# Patient Record
Sex: Female | Born: 1970 | Race: White | Hispanic: No | Marital: Single | Smoking: Former smoker
Health system: Southern US, Community
[De-identification: ages and names within clinical notes are randomized; demographics above are authoritative.]

## PROBLEM LIST (undated history)

## (undated) DIAGNOSIS — S83206A Unspecified tear of unspecified meniscus, current injury, right knee, initial encounter: Secondary | ICD-10-CM

## (undated) DIAGNOSIS — Q615 Medullary cystic kidney: Secondary | ICD-10-CM

## (undated) DIAGNOSIS — R112 Nausea with vomiting, unspecified: Secondary | ICD-10-CM

## (undated) DIAGNOSIS — E559 Vitamin D deficiency, unspecified: Secondary | ICD-10-CM

## (undated) DIAGNOSIS — E119 Type 2 diabetes mellitus without complications: Secondary | ICD-10-CM

## (undated) DIAGNOSIS — Z9889 Other specified postprocedural states: Secondary | ICD-10-CM

## (undated) DIAGNOSIS — Z6841 Body Mass Index (BMI) 40.0 and over, adult: Secondary | ICD-10-CM

## (undated) DIAGNOSIS — M1990 Unspecified osteoarthritis, unspecified site: Secondary | ICD-10-CM

## (undated) DIAGNOSIS — F419 Anxiety disorder, unspecified: Secondary | ICD-10-CM

## (undated) DIAGNOSIS — M7752 Other enthesopathy of left foot: Secondary | ICD-10-CM

## (undated) DIAGNOSIS — R32 Unspecified urinary incontinence: Secondary | ICD-10-CM

## (undated) DIAGNOSIS — Z87442 Personal history of urinary calculi: Secondary | ICD-10-CM

## (undated) DIAGNOSIS — Z8489 Family history of other specified conditions: Secondary | ICD-10-CM

## (undated) DIAGNOSIS — G5602 Carpal tunnel syndrome, left upper limb: Secondary | ICD-10-CM

## (undated) HISTORY — PX: OTHER SURGICAL HISTORY: SHX169

## (undated) HISTORY — PX: LAPAROSCOPIC APPENDECTOMY: SUR753

## (undated) HISTORY — PX: TONSILLECTOMY: SUR1361

## (undated) HISTORY — PX: HERNIA REPAIR: SHX51

## (undated) HISTORY — PX: ABDOMINAL HYSTERECTOMY: SHX81

## (undated) HISTORY — DX: Vitamin D deficiency, unspecified: E55.9

## (undated) HISTORY — PX: LAPAROSCOPIC CHOLECYSTECTOMY: SUR755

## (undated) HISTORY — PX: APPENDECTOMY: SHX54

## (undated) HISTORY — PX: COLONOSCOPY: SHX174

## (undated) HISTORY — PX: CYSTOSCOPY MACROPLASTIQUE IMPLANT: SHX6636

---

## 2001-02-11 HISTORY — PX: VAGINAL HYSTERECTOMY: SUR661

## 2003-02-12 HISTORY — PX: URETEROLITHOTOMY: SHX71

## 2004-06-14 ENCOUNTER — Ambulatory Visit: Payer: Self-pay | Admitting: Urology

## 2004-06-22 ENCOUNTER — Ambulatory Visit: Payer: Self-pay | Admitting: Urology

## 2004-07-09 ENCOUNTER — Encounter: Admission: RE | Admit: 2004-07-09 | Discharge: 2004-07-09 | Payer: Self-pay | Admitting: Neurosurgery

## 2009-09-05 ENCOUNTER — Ambulatory Visit: Payer: Self-pay | Admitting: General Practice

## 2009-10-10 ENCOUNTER — Ambulatory Visit: Payer: Self-pay | Admitting: Surgery

## 2009-10-12 LAB — PATHOLOGY REPORT

## 2010-11-26 ENCOUNTER — Ambulatory Visit: Payer: Self-pay | Admitting: General Practice

## 2010-12-04 ENCOUNTER — Ambulatory Visit: Payer: Self-pay | Admitting: General Surgery

## 2012-09-29 ENCOUNTER — Ambulatory Visit: Payer: Self-pay | Admitting: Urology

## 2012-09-29 LAB — BASIC METABOLIC PANEL
Chloride: 105 mmol/L (ref 98–107)
Co2: 30 mmol/L (ref 21–32)
Creatinine: 0.68 mg/dL (ref 0.60–1.30)
EGFR (African American): >60
Osmolality: 276 (ref 275–301)
Potassium: 4.4 mmol/L (ref 3.5–5.1)
Sodium: 139 mmol/L (ref 136–145)

## 2012-10-05 ENCOUNTER — Ambulatory Visit: Payer: Self-pay | Admitting: Urology

## 2014-06-03 NOTE — H&P (Signed)
PATIENT NAME:  Leslie Morris MR#:  [id number] DATE OF BIRTH:  12/26/1970  DATE OF ADMISSION:  09/29/2012  DATE OF SURGERY: The patient is to have same-day surgery 10/05/2012.  CHIEF COMPLAINT: Urinary incontinence.   HISTORY OF PRESENT ILLNESS: [name] is a 44 year old white female with a one-year history of stress and urge incontinence. She also has a sensation of incomplete bladder emptying and voids up to 2 to 3 times per night. She wears 1 to 4 pads per day depending upon her activity level. She was initially treated with Ditropan XL 15 mg per day and had some improvement of her urgency but not her stress incontinence. She comes in now for Macroplastique injection.   ALLERGIES: No drug allergies.   CURRENT MEDICATIONS: Ditropan XL 15 mg a day and ibuprofen.   PAST SURGICAL HISTORY: Includes hysterectomy in 2002, ureteroscopic ureterolithotomy in 2004, cholecystectomy in 1157, umbilical herniorrhaphy and appendectomy in 2012, reconstruction of left ankle surgery in 1989 and 1990.   SOCIAL HISTORY: She denied tobacco or alcohol abuse.   FAMILY HISTORY: Remarkable for parents with kidney stones, hypercholesterolemia and hypertension.   CURRENT MEDICAL CONDITIONS:  1.  Medullary sponge kidneys.  2.  Kidney stones.  3.  Anxiety.  4.  Obesity.   REVIEW OF SYSTEMS: The patient denies chest pain, shortness of breath, diabetes, stroke.   PHYSICAL EXAMINATION:  GENERAL: An obese white female in no distress.  HEENT: Sclerae were clear. Pupils are equal, round and reactive to light and accommodation. Extraocular movements were intact.  NECK: Supple. No palpable cervical adenopathy. No audible carotid bruits.  LUNGS: Clear to auscultation.  CARDIOVASCULAR: Regular rhythm and rate without audible murmurs.  ABDOMEN: Soft, nontender abdomen.  GENITOURINARY: Normal external genitalia without vaginal discharge. She had first-degree cystocele with positive Marshall test. Normal urethral  meatus.  RECTAL: Deferred.  NEUROMUSCULAR: Alert and oriented x3.   IMPRESSION:  1.  Mixed urinary incontinence.  2.  Morbid obesity.   PLAN: Macroplastique injection.    ____________________________ Otelia Limes. Yves Dill, MD [email or web address] D: 09/29/2012 14:20:13 ET T: 09/29/2012 15:07:07 ET JOB#: [id number]  cc: Otelia Limes. Yves Dill, MD, <Dictator> Royston Cowper MD ELECTRONICALLY SIGNED 10/02/2012 9:12

## 2014-06-03 NOTE — H&P (Signed)
PATIENT NAME:  Leslie Morris MR#:  [id number] DATE OF BIRTH:  [birth year]  DATE OF ADMISSION:  09/29/2012  DATE OF SURGERY: The patient is to have same-day surgery 10/05/2012.  CHIEF COMPLAINT: Urinary incontinence.   HISTORY OF PRESENT ILLNESS: Ms. Brayton El is a 44 year old white female with a one-year history of stress and urge incontinence. She also has a sensation of incomplete bladder emptying and voids up to 2 to 3 times per night. She wears 1 to 4 pads per day depending upon her activity level. She was initially treated with Ditropan XL 15 mg per day and had some improvement of her urgency but not her stress incontinence. She comes in now for Macroplastique injection.   ALLERGIES: No drug allergies.   CURRENT MEDICATIONS: Ditropan XL 15 mg a day and ibuprofen.   PAST SURGICAL HISTORY: Includes hysterectomy in 2002, ureteroscopic ureterolithotomy in 2004, cholecystectomy in 4665, umbilical herniorrhaphy and appendectomy in 2012, reconstruction of left ankle surgery in 1989 and 1990.   SOCIAL HISTORY: She denied tobacco or alcohol abuse.   FAMILY HISTORY: Remarkable for parents with kidney stones, hypercholesterolemia and hypertension.   CURRENT MEDICAL CONDITIONS:  1.  Medullary sponge kidneys.  2.  Kidney stones.  3.  Anxiety.  4.  Obesity.   REVIEW OF SYSTEMS: The patient denies chest pain, shortness of breath, diabetes, stroke.   PHYSICAL EXAMINATION:  GENERAL: An obese white female in no distress.  HEENT: Sclerae were clear. Pupils are equal, round and reactive to light and accommodation. Extraocular movements were intact.  NECK: Supple. No palpable cervical adenopathy. No audible carotid bruits.  LUNGS: Clear to auscultation.  CARDIOVASCULAR: Regular rhythm and rate without audible murmurs.  ABDOMEN: Soft, nontender abdomen.  GENITOURINARY: Normal external genitalia without vaginal discharge. She had first-degree cystocele with positive Marshall test. Normal urethral  meatus.  RECTAL: Deferred.  NEUROMUSCULAR: Alert and oriented x3.   IMPRESSION:  1.  Mixed urinary incontinence.  2.  Morbid obesity.   PLAN: Macroplastique injection.    ____________________________ Otelia Limes. Yves Dill, MD [email or web address] D: 09/29/2012 14:20:13 ET T: 09/29/2012 15:07:07 ET JOB#: [id number]  cc: Otelia Limes. Yves Dill, MD, <Dictator> Royston Cowper MD ELECTRONICALLY SIGNED 10/02/2012 9:12

## 2014-06-03 NOTE — Op Note (Signed)
PATIENT NAME:  Leslie Morris Code MR#:  [id number] DATE OF BIRTH:  May 07, 1970  DATE OF PROCEDURE:  10/05/2012  PREOPERATIVE DIAGNOSIS: Urinary incontinence.   POSTOPERATIVE DIAGNOSIS: Urinary incontinence.   PROCEDURE: Cystoscopy with Macroplastique implant.   SURGEON: Maryan Puls, MD   ANESTHETIST: Rice and surgeon.  ANESTHETIC METHOD: General per Rice and local per Dr. Yves Dill.   INDICATIONS: See the dictated history and physical. After informed consent, the patient requests the above procedure.   OPERATIVE SUMMARY: After adequate general anesthesia had been obtained, the patient was placed into dorsal lithotomy position and the perineum was prepped and draped in the usual fashion. The 17-French cystoscope was coupled with the camera and then advanced into the bladder. The bladder was thoroughly inspected. Both ureteral orifices were identified and had clear efflux. No bladder mucosal lesions were identified. At this point, the Macroplastique needle was introduced through the scope and initial puncture was performed at the 3 o'clock position. 2.5 mL of Macroplastique was injected in this location and there was good elevation of the mucosa. Second injection was performed at the 6 o'clock position and the third injection was performed at the 9 o'clock position. After completion of all 3 injections there was complete coaptation of the mucosa circumferentially. A total of 7.5 mL of Macroplastique was injected. At this point, the scope was removed and bladder was drained with a 10-French red Robinson catheter. 10 mL of viscous Xylocaine was injected within the urethra and the bladder. A B and O suppository was placed. The procedure was then terminated, and the patient was transferred to the recovery room in stable condition. ____________________________ Otelia Limes. Yves Dill, MD [email or web address] D: 10/05/2012 10:22:27 ET T: 10/05/2012 10:43:12 ET JOB#: [id number]  cc: Otelia Limes. Yves Dill, MD, <Dictator> Royston Cowper MD ELECTRONICALLY SIGNED 10/05/2012 11:57

## 2014-09-19 ENCOUNTER — Ambulatory Visit
Admission: RE | Admit: 2014-09-19 | Discharge: 2014-09-19 | Disposition: A | Discharge status: Home / Self Care | Payer: PRIVATE HEALTH INSURANCE | Source: Ambulatory Visit | Attending: Physician Assistant | Admitting: Physician Assistant

## 2014-09-19 ENCOUNTER — Other Ambulatory Visit: Payer: Self-pay | Admitting: Physician Assistant

## 2014-09-19 DIAGNOSIS — M25572 Pain in left ankle and joints of left foot: Secondary | ICD-10-CM | POA: Diagnosis present

## 2014-09-19 DIAGNOSIS — M25571 Pain in right ankle and joints of right foot: Secondary | ICD-10-CM

## 2015-04-14 ENCOUNTER — Other Ambulatory Visit: Payer: Self-pay | Admitting: Physician Assistant

## 2015-04-14 ENCOUNTER — Ambulatory Visit: Payer: Self-pay | Admitting: Physician Assistant

## 2015-04-14 ENCOUNTER — Encounter: Payer: Self-pay | Admitting: Physician Assistant

## 2015-04-14 VITALS — BP 119/80 | HR 82 | Temp 98.0°F

## 2015-04-14 DIAGNOSIS — H65191 Other acute nonsuppurative otitis media, right ear: Secondary | ICD-10-CM

## 2015-04-14 LAB — POCT INFLUENZA A/B
INFLUENZA A, POC: NEGATIVE
INFLUENZA B, POC: NEGATIVE

## 2015-04-14 MED ORDER — FEXOFENADINE-PSEUDOEPHED ER 60-120 MG PO TB12: 1.0000 | ORAL_TABLET | Freq: Two times a day (BID) | ORAL | Status: DC

## 2015-04-14 MED ORDER — AMOXICILLIN 875 MG PO TABS: 875.0000 mg | ORAL_TABLET | Freq: Two times a day (BID) | ORAL | Status: DC

## 2015-04-14 MED ORDER — ONDANSETRON HCL 8 MG PO TABS: 8.0000 mg | ORAL_TABLET | Freq: Three times a day (TID) | ORAL | Status: DC | PRN

## 2015-04-14 NOTE — Progress Notes (Signed)
   Subjective:    Patient ID: Leslie Morris, female    DOB: February 29, 1970, 45 y.o.   MRN: [id number]  HPI Patient c/o right ear pain, nasal congestion, nausea, diarrhea, and malaise. Patient has taken Flu shot for this season. States daughter and co-worker have flu. Denies fever/chill or sore throat.   Review of Systems    Obesity Objective:   Physical Exam HEENT for edematous and erythematous right TM.  Bilateral maxillary guarding.  Bilateral nasal turbinates.  Neck supple, no adenopathy. Lungs CTS, and Heart RRR.       Assessment & Plan: Right Otitis Medica  Amoxil, Allergra-D, and Zofran.  Follow up with PCP. Work note for today.

## 2015-04-14 NOTE — Addendum Note (Signed)
Addended by: Rudene Anda T on: 04/14/2015 01:55 PM   Modules accepted: Orders

## 2015-04-18 ENCOUNTER — Ambulatory Visit: Payer: Self-pay | Admitting: Registered Nurse

## 2015-04-18 VITALS — BP 110/79 | HR 90 | Temp 98.4°F

## 2015-04-18 DIAGNOSIS — H6593 Unspecified nonsuppurative otitis media, bilateral: Secondary | ICD-10-CM

## 2015-04-18 DIAGNOSIS — B349 Viral infection, unspecified: Secondary | ICD-10-CM

## 2015-04-18 MED ORDER — SALINE SPRAY 0.65 % NA SOLN: 2.0000 | NASAL | Status: DC | PRN

## 2015-04-18 MED ORDER — FLUTICASONE PROPIONATE 50 MCG/ACT NA SUSP: 1.0000 | Freq: Two times a day (BID) | NASAL | Status: DC

## 2015-04-18 MED ORDER — ALBUTEROL SULFATE HFA 108 (90 BASE) MCG/ACT IN AERS: 1.0000 | INHALATION_SPRAY | RESPIRATORY_TRACT | Status: DC | PRN

## 2015-04-18 NOTE — Progress Notes (Signed)
Subjective:    Patient ID: Leslie Morris, female    DOB: October 01, 1970, 45 y.o.   MRN: [id number]  HPI Comments: Caucasian female was seen last week by PA Ron Tamala Julian 04/14/2015 for sinusitis/otitis media and nausea.  Patient reported she has been taking zofran, amoxicillin and allegra D as prescribed.  Stomach upset resolved still having post tussive emesis though, intermittent wheeze.  PMHx seasonal allergic rhinitis typically fall ragweed cannot remember having to use oral steroids in the past has had to take advair diskus in the past  Requires work excuse didn't go to work yesterday or today due to vomiting with coughing.  Right ear still hurting a lot using heating pad on high to give her some relief.  Tested negative for rapid flu 04/14/2015.  Still running low grade fever at home tylenol or motrin po prn OTC.  URI  Associated symptoms include congestion, coughing, ear pain, headaches, nausea, vomiting and wheezing. Pertinent negatives include no abdominal pain, chest pain, diarrhea, dysuria, neck pain, rash, rhinorrhea, sneezing or sore throat.      Review of Systems  Constitutional: Positive for fever and fatigue. Negative for chills, diaphoresis, activity change, appetite change and unexpected weight change.  HENT: Positive for congestion, ear pain, postnasal drip and sinus pressure. Negative for dental problem, drooling, ear discharge, facial swelling, hearing loss, mouth sores, nosebleeds, rhinorrhea, sneezing, sore throat, tinnitus, trouble swallowing and voice change.   Eyes: Negative for photophobia, pain, discharge, redness, itching and visual disturbance.  Respiratory: Positive for cough and wheezing. Negative for choking, chest tightness, shortness of breath and stridor.   Cardiovascular: Negative for chest pain, palpitations and leg swelling.  Gastrointestinal: Positive for nausea and vomiting. Negative for abdominal pain, diarrhea, constipation, blood in stool and abdominal  distention.  Endocrine: Negative for cold intolerance and heat intolerance.  Genitourinary: Negative for dysuria, hematuria and difficulty urinating.  Musculoskeletal: Positive for myalgias. Negative for back pain, joint swelling, arthralgias, gait problem, neck pain and neck stiffness.  Skin: Negative for color change, pallor, rash and wound.  Allergic/Immunologic: Positive for environmental allergies. Negative for food allergies.  Neurological: Positive for headaches. Negative for dizziness, tremors, seizures, syncope, facial asymmetry, speech difficulty, weakness, light-headedness and numbness.  Hematological: Negative for adenopathy. Does not bruise/bleed easily.  Psychiatric/Behavioral: Negative for behavioral problems, confusion, sleep disturbance and agitation.       Objective:   Physical Exam  Constitutional: She is oriented to person, place, and time. She appears well-developed and well-nourished. She is active and cooperative.  Non-toxic appearance. She does not have a sickly appearance. She appears ill. No distress.  HENT:  Head: Normocephalic and atraumatic.  Right Ear: Hearing, external ear and ear canal normal. A middle ear effusion is present.  Left Ear: Hearing, external ear and ear canal normal. A middle ear effusion is present.  Nose: Mucosal edema and rhinorrhea present. No nose lacerations, sinus tenderness, nasal deformity, septal deviation or nasal septal hematoma. No epistaxis.  No foreign bodies. Right sinus exhibits no maxillary sinus tenderness and no frontal sinus tenderness. Left sinus exhibits no maxillary sinus tenderness and no frontal sinus tenderness.  Mouth/Throat: Uvula is midline and mucous membranes are normal. Mucous membranes are not pale, not dry and not cyanotic. She does not have dentures. No oral lesions. No trismus in the jaw. Normal dentition. No dental abscesses, uvula swelling, lacerations or dental caries. Posterior oropharyngeal edema and posterior  oropharyngeal erythema present. No oropharyngeal exudate or tonsillar abscesses.  Cobblestoning posterior pharynx; bilateral  TMs with air fluid level slight opacity; bilateral nasal turbinates edema/erythema/yellow  Eyes: Conjunctivae, EOM and lids are normal. Pupils are equal, round, and reactive to light. Right eye exhibits no chemosis, no discharge, no exudate and no hordeolum. No foreign body present in the right eye. Left eye exhibits no chemosis, no discharge, no exudate and no hordeolum. No foreign body present in the left eye. Right conjunctiva is not injected. Right conjunctiva has no hemorrhage. Left conjunctiva is not injected. Left conjunctiva has no hemorrhage. No scleral icterus. Right eye exhibits normal extraocular motion and no nystagmus. Left eye exhibits normal extraocular motion and no nystagmus. Right pupil is round and reactive. Left pupil is round and reactive. Pupils are equal.  Neck: Trachea normal and normal range of motion. Neck supple. No tracheal tenderness, no spinous process tenderness and no muscular tenderness present. No rigidity. No tracheal deviation, no edema, no erythema and normal range of motion present. No thyroid mass and no thyromegaly present.  Cardiovascular: Normal rate, regular rhythm, S1 normal, S2 normal, normal heart sounds and intact distal pulses.  PMI is not displaced.  Exam reveals no gallop and no friction rub.   No murmur heard. Pulmonary/Chest: Effort normal and breath sounds normal. No accessory muscle usage or stridor. No respiratory distress. She has no decreased breath sounds. She has no wheezes. She has no rhonchi. She has no rales. She exhibits no tenderness.  Abdominal: Soft. She exhibits no distension.  Musculoskeletal: Normal range of motion. She exhibits no edema or tenderness.       Right shoulder: Normal.       Left shoulder: Normal.       Right hip: Normal.       Left hip: Normal.       Right knee: Normal.       Left knee: Normal.        Cervical back: Normal.       Right hand: Normal.       Left hand: Normal.  Lymphadenopathy:       Head (right side): No submental, no submandibular, no tonsillar, no preauricular, no posterior auricular and no occipital adenopathy present.       Head (left side): No submental, no submandibular, no tonsillar, no preauricular, no posterior auricular and no occipital adenopathy present.    She has no cervical adenopathy.       Right cervical: No superficial cervical, no deep cervical and no posterior cervical adenopathy present.      Left cervical: No superficial cervical, no deep cervical and no posterior cervical adenopathy present.  Neurological: She is alert and oriented to person, place, and time. She has normal strength. She is not disoriented. She displays no atrophy and no tremor. No cranial nerve deficit or sensory deficit. She exhibits normal muscle tone. She displays no seizure activity. Coordination and gait normal. GCS eye subscore is 4. GCS verbal subscore is 5. GCS motor subscore is 6.  Skin: Skin is warm, dry and intact. No abrasion, no bruising, no burn, no ecchymosis, no laceration, no lesion, no petechiae and no rash noted. She is not diaphoretic. No cyanosis or erythema. No pallor. Nails show no clubbing.  Psychiatric: She has a normal mood and affect. Her speech is normal and behavior is normal. Judgment and thought content normal. Cognition and memory are normal.  Nursing note and vitals reviewed.         Assessment & Plan:  A-Viral illness influenza like; viral gastroenteritis, otitis media effusion P-continue  amoxicillin, allegra D, start flonase 1 spray each nostril BID, nasal saline 2 sprays each nostril q2h prn congestion, albuterol 1-2 puffs po q4-6h prn cough/wheeze, honey with lemon, cough drops, delsym or nyquil po prn cough bedtime; work excuse 6-19 Apr 2015 stay at home until fever free 24 hours off tylenol/motrin and no diarrhea/vomiting x 24 hours  Viral  illness: no evidence of invasive bacterial infection, non toxic and well hydrated.  This is most likely self limiting viral infection.  I do not see where any further testing or imaging is necessary at this time.   I will suggest supportive care, rest, good hygiene and encourage the patient to take adequate fluids.  Tylenol 1000mg  po QID prn pain/fever.  Discussed honey with lemon and salt water gargles for comfort also.  The patient is to return to clinic or EMERGENCY ROOM if symptoms worsen or change significantly e.g. fever, lethargy, SOB, wheezing.  Patient verbalized agreement and understanding of treatment plan.    Has zofran po at home prn use from 04/14/2015.  Discussed use inhaler albuterol MDI 2 puffs po q6h x 48 hours and then prn protracted cough wheezing every 4-6hours after that.  I have recommended clear fluids and bland diet.  Avoid dairy/spicy, fried and large portions of meat while having nausea.  If vomiting hold po intake x 1 hour.  Then sips clear fluids like broths, ginger ale, power ade, gatorade, pedialyte may advance to soft/bland if no vomiting x 24 hours and appetite returned otherwise hydration main focus.     Return to the clinic if symptoms persist or worsen; I have alerted the patient to call if high fever, dehydration, marked weakness, fainting, increased abdominal pain, blood in stool or vomit (red or black).  Patient verbalized agreement and understanding of treatment plan and had no further questions at this time.  Supportive treatment.   No evidence of invasive bacterial infection, non toxic and well hydrated.  This is most likely self limiting viral infection.  I do not see where any further testing or imaging is necessary at this time.   I will suggest supportive care, rest, good hygiene and encourage the patient to take adequate fluids.  The patient is to return to clinic or EMERGENCY ROOM if symptoms worsen or change significantly e.g. ear pain, fever, purulent discharge  from ears or bleeding.  Patient verbalized agreement and understanding of treatment plan.

## 2015-06-29 ENCOUNTER — Encounter: Payer: Self-pay | Admitting: Physician Assistant

## 2015-06-29 ENCOUNTER — Ambulatory Visit: Payer: Self-pay | Admitting: Physician Assistant

## 2015-06-29 VITALS — BP 129/80 | HR 74 | Temp 98.5°F

## 2015-06-29 DIAGNOSIS — E559 Vitamin D deficiency, unspecified: Secondary | ICD-10-CM

## 2015-06-29 DIAGNOSIS — N393 Stress incontinence (female) (male): Secondary | ICD-10-CM

## 2015-06-29 DIAGNOSIS — M25561 Pain in right knee: Secondary | ICD-10-CM

## 2015-06-29 MED ORDER — VITAMIN D (ERGOCALCIFEROL) 1.25 MG (50000 UNIT) PO CAPS: 50000.0000 [IU] | ORAL_CAPSULE | ORAL | Status: DC

## 2015-06-29 MED ORDER — NAPROXEN 500 MG PO TABS: 500.0000 mg | ORAL_TABLET | Freq: Two times a day (BID) | ORAL | Status: DC

## 2015-06-29 MED ORDER — TRAMADOL HCL 50 MG PO TABS: 50.0000 mg | ORAL_TABLET | Freq: Four times a day (QID) | ORAL | Status: DC | PRN

## 2015-06-29 MED ORDER — OXYBUTYNIN CHLORIDE ER 15 MG PO TB24: 15.0000 mg | ORAL_TABLET | Freq: Every day | ORAL | Status: DC

## 2015-06-29 NOTE — Progress Notes (Signed)
S: c/o r knee pain, was standing in the kitchen and her over 100lb dog ran directly into the front of her knee, pain was so bad she was crying, doesn't feel unstable, just cant stand on it or go from sitting to standing without a lot of pain, hurts to extend the leg, doesn't feel she can extend all of the way  O: vitals wnl, nad, skin intact, r knee tender at anterior of patella and joint line down to turbercle tuberosity, can extend the knee but it reproduces pain, n/v intact, applied 3 ace wraps, pt was able to stand and felt better with the compression, gave crutches and instructions  A: acute knee pain  P: naproxyn, tramadol 50mg  #20 with 1 refill as patient can use this at work, f/u with dr h. Miller; also refill of ditropan and vit d

## 2015-06-30 ENCOUNTER — Encounter: Payer: Self-pay | Admitting: Family

## 2015-06-30 ENCOUNTER — Ambulatory Visit (INDEPENDENT_AMBULATORY_CARE_PROVIDER_SITE_OTHER): Payer: Managed Care, Other (non HMO) | Admitting: Family

## 2015-06-30 VITALS — BP 132/80 | HR 65 | Temp 98.5°F | Ht 67.5 in | Wt 363.0 lb

## 2015-06-30 DIAGNOSIS — M25561 Pain in right knee: Secondary | ICD-10-CM | POA: Insufficient documentation

## 2015-06-30 MED ORDER — DICLOFENAC SODIUM 2 % TD SOLN: 1.0000 "application " | Freq: Two times a day (BID) | TRANSDERMAL | Status: DC | PRN

## 2015-06-30 NOTE — Assessment & Plan Note (Signed)
Right knee pain consistent with possible contusion related to impact with her dog. Ultrasound shows mild pre-patellar bursitis which is most likely chronic and also a small area of effusion consistent with hematoma or possible bursa. Treat conservatively with ice, Pennsaid, and home exercise therapy. Follow-up pending conservative treatment if symptoms worsen or do not improve.

## 2015-06-30 NOTE — Patient Instructions (Signed)
Thank you for choosing [facility].  Summary/Instructions:  Ice 2-3 times per day and as needed after activity Exercise daily. Pennsaid - 2x daily to the affected area about 1/2 pack   Your prescription(s) have been submitted to your pharmacy or been printed and provided for you. Please take as directed and contact our office if you believe you are having problem(s) with the medication(s) or have any questions.  Generic Knee Exercises EXERCISES RANGE OF MOTION (ROM) AND STRETCHING EXERCISES These exercises may help you when beginning to rehabilitate your injury. Your symptoms may resolve with or without further involvement from your physician, physical therapist, or athletic trainer. While completing these exercises, remember:   Restoring tissue flexibility helps normal motion to return to the joints. This allows healthier, less painful movement and activity.  An effective stretch should be held for at least 30 seconds.  A stretch should never be painful. You should only feel a gentle lengthening or release in the stretched tissue. STRETCH - Knee Extension, Prone  Lie on your stomach on a firm surface, such as a bed or countertop. Place your right / left knee and leg just beyond the edge of the surface. You may wish to place a towel under the far end of your right / left thigh for comfort.  Relax your leg muscles and allow gravity to straighten your knee. Your clinician may advise you to add an ankle weight if more resistance is helpful for you.  You should feel a stretch in the back of your right / left knee. Hold this position for __________ seconds. Repeat __________ times. Complete this stretch __________ times per day. * Your physician, physical therapist, or athletic trainer may ask you to add ankle weight to enhance your stretch.  RANGE OF MOTION - Knee Flexion, Active  Lie on your back with both knees straight. (If this causes back discomfort, bend your opposite knee,  placing your foot flat on the floor.)  Slowly slide your heel back toward your buttocks until you feel a gentle stretch in the front of your knee or thigh.  Hold for __________ seconds. Slowly slide your heel back to the starting position. Repeat __________ times. Complete this exercise __________ times per day.  STRETCH - Quadriceps, Prone   Lie on your stomach on a firm surface, such as a bed or padded floor.  Bend your right / left knee and grasp your ankle. If you are unable to reach your ankle or pant leg, use a belt around your foot to lengthen your reach.  Gently pull your heel toward your buttocks. Your knee should not slide out to the side. You should feel a stretch in the front of your thigh and/or knee.  Hold this position for __________ seconds. Repeat __________ times. Complete this stretch __________ times per day.  STRETCH - Hamstrings, Supine   Lie on your back. Loop a belt or towel over the ball of your right / left foot.  Straighten your right / left knee and slowly pull on the belt to raise your leg. Do not allow the right / left knee to bend. Keep your opposite leg flat on the floor.  Raise the leg until you feel a gentle stretch behind your right / left knee or thigh. Hold this position for __________ seconds. Repeat __________ times. Complete this stretch __________ times per day.  STRENGTHENING EXERCISES These exercises may help you when beginning to rehabilitate your injury. They may resolve your symptoms with or without  further involvement from your physician, physical therapist, or athletic trainer. While completing these exercises, remember:   Muscles can gain both the endurance and the strength needed for everyday activities through controlled exercises.  Complete these exercises as instructed by your physician, physical therapist, or athletic trainer. Progress the resistance and repetitions only as guided.  You may experience muscle soreness or fatigue, but  the pain or discomfort you are trying to eliminate should never worsen during these exercises. If this pain does worsen, stop and make certain you are following the directions exactly. If the pain is still present after adjustments, discontinue the exercise until you can discuss the trouble with your clinician. STRENGTH - Quadriceps, Isometrics  Lie on your back with your right / left leg extended and your opposite knee bent.  Gradually tense the muscles in the front of your right / left thigh. You should see either your knee cap slide up toward your hip or increased dimpling just above the knee. This motion will push the back of the knee down toward the floor/mat/bed on which you are lying.  Hold the muscle as tight as you can without increasing your pain for __________ seconds.  Relax the muscles slowly and completely in between each repetition. Repeat __________ times. Complete this exercise __________ times per day.  STRENGTH - Quadriceps, Short Arcs   Lie on your back. Place a __________ inch towel roll under your knee so that the knee slightly bends.  Raise only your lower leg by tightening the muscles in the front of your thigh. Do not allow your thigh to rise.  Hold this position for __________ seconds. Repeat __________ times. Complete this exercise __________ times per day.  OPTIONAL ANKLE WEIGHTS: Begin with ____________________, but DO NOT exceed ____________________. Increase in 1 pound/0.5 kilogram increments.  STRENGTH - Quadriceps, Straight Leg Raises  Quality counts! Watch for signs that the quadriceps muscle is working to insure you are strengthening the correct muscles and not "cheating" by substituting with healthier muscles.  Lay on your back with your right / left leg extended and your opposite knee bent.  Tense the muscles in the front of your right / left thigh. You should see either your knee cap slide up or increased dimpling just above the knee. Your thigh may even  quiver.  Tighten these muscles even more and raise your leg 4 to 6 inches off the floor. Hold for __________ seconds.  Keeping these muscles tense, lower your leg.  Relax the muscles slowly and completely in between each repetition. Repeat __________ times. Complete this exercise __________ times per day.  STRENGTH - Hamstring, Curls  Lay on your stomach with your legs extended. (If you lay on a bed, your feet may hang over the edge.)  Tighten the muscles in the back of your thigh to bend your right / left knee up to 90 degrees. Keep your hips flat on the bed/floor.  Hold this position for __________ seconds.  Slowly lower your leg back to the starting position. Repeat __________ times. Complete this exercise __________ times per day.  OPTIONAL ANKLE WEIGHTS: Begin with ____________________, but DO NOT exceed ____________________. Increase in 1 pound/0.5 kilogram increments.  STRENGTH - Quadriceps, Squats  Stand in a door frame so that your feet and knees are in line with the frame.  Use your hands for balance, not support, on the frame.  Slowly lower your weight, bending at the hips and knees. Keep your lower legs upright so that they are  parallel with the door frame. Squat only within the range that does not increase your knee pain. Never let your hips drop below your knees.  Slowly return upright, pushing with your legs, not pulling with your hands. Repeat __________ times. Complete this exercise __________ times per day.  STRENGTH - Quadriceps, Wall Slides  Follow guidelines for form closely. Increased knee pain often results from poorly placed feet or knees.  Lean against a smooth wall or door and walk your feet out 18-24 inches. Place your feet hip-width apart.  Slowly slide down the wall or door until your knees bend __________ degrees.* Keep your knees over your heels, not your toes, and in line with your hips, not falling to either side.  Hold for __________ seconds.  Stand up to rest for __________ seconds in between each repetition. Repeat __________ times. Complete this exercise __________ times per day. * Your physician, physical therapist, or athletic trainer will alter this angle based on your symptoms and progress.   This information is not intended to replace advice given to you by your health care provider. Make sure you discuss any questions you have with your health care provider.   Document Released: 12/12/2004 Document Revised: 02/18/2014 Document Reviewed: 05/12/2008 Elsevier Interactive Patient Education [employer]. If your symptoms worsen or fail to improve, please contact our office for further instruction, or in case of emergency go directly to the emergency room at the closest medical facility.    t

## 2015-06-30 NOTE — Progress Notes (Signed)
Pre visit review using our clinic review tool, if applicable. No additional management support is needed unless otherwise documented below in the visit note. 

## 2015-06-30 NOTE — Progress Notes (Signed)
Subjective:    Patient ID: Leslie Morris, female    DOB: 1970-07-29, 45 y.o.   MRN: [id number]  Chief Complaint  Patient presents with  . Knee Pain    HPI:  Leslie Morris is a 45 y.o. female who  has a past medical history of Vitamin D deficiency; Chicken pox; and Kidney stones. and presents today for an office visit.  This is a new problem. Associated symptom of pain located in her right knee has been going on for about 1-2 days following standing in the kitchen when her over 100 lb dog ran directly into the front of her knee which was planted in the ground. Denies any sounds or sensations heard/felt. Described extensive pain with any weight bearing activities. Felt like a metal bat hit her leg. She was seen at her [facility] and prescribed naproxen. Other modifying factors include ice. Has noted a little improvement since yesterday. Denies any popping/snaping/catching or locking. There is mild reduction in full flexion  No Known Allergies   Outpatient Prescriptions Prior to Visit  Medication Sig Dispense Refill  . naproxen (NAPROSYN) 500 MG tablet Take 1 tablet (500 mg total) by mouth 2 (two) times daily with a meal. 30 tablet 0  . oxybutynin (DITROPAN XL) 15 MG 24 hr tablet Take 1 tablet (15 mg total) by mouth at bedtime. 30 tablet 6  . traMADol (ULTRAM) 50 MG tablet Take 1 tablet (50 mg total) by mouth every 6 (six) hours as needed. 20 tablet 1  . Vitamin D, Ergocalciferol, (DRISDOL) 50000 units CAPS capsule Take 1 capsule (50,000 Units total) by mouth every 7 (seven) days. 4 capsule 12  . fluticasone (FLONASE) 50 MCG/ACT nasal spray Place 1 spray into both nostrils 2 (two) times daily. 16 g 0  . sodium chloride (OCEAN) 0.65 % SOLN nasal spray Place 2 sprays into both nostrils every 2 (two) hours as needed for congestion.  0   No facility-administered medications prior to visit.     Past Medical History  Diagnosis Date  . Vitamin D deficiency   . Chicken pox     . Kidney stones      Past Surgical History  Procedure Laterality Date  . Ankle surgery      Left from trauma  . Cholecystectomy    . Abdominal hysterectomy    . Tonsillectomy    . Appendectomy       History reviewed. No pertinent family history.   Social History   Social History  . Marital Status: Single    Spouse Name: N/A  . Number of Children: 2  . Years of Education: 14   Occupational History  . [employer]    Social History Main Topics  . Smoking status: Former Research scientist (life sciences)  . Smokeless tobacco: Never Used  . Alcohol Use: No  . Drug Use: No  . Sexual Activity: Not on file   Other Topics Concern  . Not on file   Social History Narrative  Review of Systems  Constitutional: Negative for fever and chills.  Cardiovascular: Negative for chest pain, palpitations and leg swelling.  Musculoskeletal:       Positive for right knee pain  Neurological: Negative for weakness and numbness.      Objective:    BP 132/80 mmHg  Pulse 65  Temp(Src) 98.5 F (36.9 C) (Oral)  Ht 5' 7.5" (1.715 m)  Wt 363 lb (164.656 kg)  BMI 55.98 kg/m2  SpO2 97% Nursing note and vital signs reviewed.  Physical Exam  Constitutional: She is oriented to person, place, and time. She appears well-developed and well-nourished. No distress.  Cardiovascular: Normal rate, regular rhythm, normal heart sounds and intact distal pulses.   Pulmonary/Chest: Effort normal and breath sounds normal.  Musculoskeletal:  Right knee - no obvious deformity or discoloration with mild edema of the lateral knee noted. No tenderness able to be elicited. Range of motion is within normal limits. Strength is 4+. Ligamentous testing is negative. Positive McMurray's. Distal pulses and sensation are intact and appropriate.  Neurological: She is alert and oriented to  person, place, and time.  Skin: Skin is warm and dry.  Psychiatric: She has a normal mood and affect. Her behavior is normal. Judgment and thought content normal.   Examination: Ultrasound of Right knee Date:  06/30/2015 Patient Name: Leslie Morris History: Knee pain following collision with her 100 lb dog Findings:  The extensor mechanism, including the quadriceps tendon, patella, and patellar tendon are normal with small area of fluid consistent with pre-patellar bursitis. There is mild effusion over the lateral joint line consistent with hematoma or possible bursa. The medial collateral and lateral collateral ligaments are normal. Unremarkable iliotibial tract, biceps femoris, popliteus tendon, and common peroneal nerve. No Baker cyst. Limited evaluation of the menisci is unremarkable.  Impression:  Mild pre-patellar bursitis and possible hematoma/bursitis of lateral joint line.         All images are located under the media tab. [place] ordered, performed and interpreted by Terri Piedra, FNP       Assessment & Plan:   Problem List Items Addressed This Visit      Other   Right knee pain - Primary    Right knee pain consistent with possible contusion related to impact with her dog. Ultrasound shows mild pre-patellar bursitis which is most likely chronic and also a small area of effusion consistent with hematoma or possible bursa. Treat conservatively with ice, Pennsaid, and home exercise therapy. Follow-up pending conservative treatment if symptoms worsen or do not improve.      Relevant Medications   Diclofenac Sodium (PENNSAID) 2 % SOLN   Other Relevant Orders   [place] Extrem Low Right Ltd       I have discontinued Ms. Stovall's fluticasone and sodium chloride. I am also having her start on Diclofenac Sodium. Additionally, I am having her maintain her naproxen, traMADol, oxybutynin, and Vitamin D (Ergocalciferol).   Meds ordered this encounter  Medications  . Diclofenac Sodium  (PENNSAID) 2 % SOLN    Sig: Place 1 application onto the skin 2 (two) times daily as needed.    Dispense:  112 g    Refill:  1    Order Specific Question:  Supervising Provider    Answer:  Pricilla Holm A [id number]     Follow-up: Return in about 3 weeks (around 07/21/2015), or if symptoms worsen or fail to improve.  Mauricio Po, FNP

## 2015-07-17 ENCOUNTER — Ambulatory Visit: Payer: Self-pay | Admitting: Physician Assistant

## 2015-07-17 ENCOUNTER — Encounter: Payer: Self-pay | Admitting: Physician Assistant

## 2015-07-17 VITALS — BP 130/90 | HR 60 | Temp 97.9°F

## 2015-07-17 DIAGNOSIS — J069 Acute upper respiratory infection, unspecified: Secondary | ICD-10-CM

## 2015-07-17 DIAGNOSIS — M25569 Pain in unspecified knee: Secondary | ICD-10-CM

## 2015-07-17 MED ORDER — CEFDINIR 300 MG PO CAPS: 300.0000 mg | ORAL_CAPSULE | Freq: Two times a day (BID) | ORAL | Status: DC

## 2015-07-17 MED ORDER — FLUCONAZOLE 150 MG PO TABS: ORAL_TABLET | ORAL | Status: DC

## 2015-07-17 MED ORDER — HYDROCODONE-ACETAMINOPHEN 5-325 MG PO TABS: 1.0000 | ORAL_TABLET | Freq: Four times a day (QID) | ORAL | Status: DC | PRN

## 2015-07-17 MED ORDER — METHYLPREDNISOLONE 4 MG PO TBPK: ORAL_TABLET | ORAL | Status: DC

## 2015-07-17 NOTE — Progress Notes (Signed)
S: C/o runny nose and congestion for 7 days, no fever, chills, cp/sob, v/d; mucus was green with a lot of blood over the weekend, cough is sporadic, feels really tired and fatigued, also doesn't feel like tramadol is helping her knee pain, saw the sports med doctor and really liked him, told her she had fluid, gave her exercises and encouraged water aerobics, states she thinks its still hurting because she is on it so much and because of her weight  Using otc meds: none  O: PE: vitals wnl, nad, perrl eomi, normocephalic, tms dull, nasal mucosa red and swollen, throat injected, neck supple no lymph, lungs c t a, cv rrr, neuro intact, knee still swollen and tender, pt walks with limp  A:  Acute sinusitis, acute knee pain   P: omnicef 300mg  bid x 10d, diflucan , medrol dose pack, vicodin 5/325 #20 nr for pain not controlled by otc meds; drink fluids, continue regular meds , use otc meds of choice, return if not improving in 5 days, return earlier if worsening

## 2015-07-28 ENCOUNTER — Telehealth: Payer: Self-pay | Admitting: Emergency Medicine

## 2015-07-28 NOTE — Telephone Encounter (Signed)
Patient called and expressed that she saw Dr. Alroy Dust Smith's PA and was not happy with the disposition.  She wants to know if we can set her up to see Dr. Hulan Saas instead of his PA.  I called the office and they scheduled her for July 19th at 9:45am.

## 2015-08-03 ENCOUNTER — Other Ambulatory Visit: Payer: Self-pay | Admitting: Physician Assistant

## 2015-08-03 NOTE — Telephone Encounter (Signed)
Med refill for naproxen approved

## 2015-08-30 ENCOUNTER — Ambulatory Visit: Payer: Self-pay | Admitting: Family Medicine

## 2016-02-09 ENCOUNTER — Emergency Department: Payer: No Typology Code available for payment source

## 2016-02-09 ENCOUNTER — Emergency Department
Admission: EM | Admit: 2016-02-09 | Discharge: 2016-02-09 | Disposition: A | Discharge status: Home / Self Care | Payer: No Typology Code available for payment source | Attending: Emergency Medicine | Admitting: Emergency Medicine

## 2016-02-09 ENCOUNTER — Encounter: Payer: Self-pay | Admitting: Emergency Medicine

## 2016-02-09 DIAGNOSIS — S62623A Displaced fracture of medial phalanx of left middle finger, initial encounter for closed fracture: Secondary | ICD-10-CM | POA: Insufficient documentation

## 2016-02-09 DIAGNOSIS — R300 Dysuria: Secondary | ICD-10-CM | POA: Diagnosis not present

## 2016-02-09 DIAGNOSIS — Y939 Activity, unspecified: Secondary | ICD-10-CM | POA: Diagnosis not present

## 2016-02-09 DIAGNOSIS — S99921A Unspecified injury of right foot, initial encounter: Secondary | ICD-10-CM | POA: Diagnosis present

## 2016-02-09 DIAGNOSIS — S92324A Nondisplaced fracture of second metatarsal bone, right foot, initial encounter for closed fracture: Secondary | ICD-10-CM

## 2016-02-09 DIAGNOSIS — M79672 Pain in left foot: Secondary | ICD-10-CM | POA: Diagnosis not present

## 2016-02-09 DIAGNOSIS — Y92410 Unspecified street and highway as the place of occurrence of the external cause: Secondary | ICD-10-CM | POA: Diagnosis not present

## 2016-02-09 DIAGNOSIS — S92344A Nondisplaced fracture of fourth metatarsal bone, right foot, initial encounter for closed fracture: Secondary | ICD-10-CM | POA: Insufficient documentation

## 2016-02-09 DIAGNOSIS — Y999 Unspecified external cause status: Secondary | ICD-10-CM | POA: Insufficient documentation

## 2016-02-09 DIAGNOSIS — Z87891 Personal history of nicotine dependence: Secondary | ICD-10-CM | POA: Insufficient documentation

## 2016-02-09 DIAGNOSIS — S92334A Nondisplaced fracture of third metatarsal bone, right foot, initial encounter for closed fracture: Secondary | ICD-10-CM | POA: Diagnosis not present

## 2016-02-09 DIAGNOSIS — S62629A Displaced fracture of medial phalanx of unspecified finger, initial encounter for closed fracture: Secondary | ICD-10-CM

## 2016-02-09 LAB — URINALYSIS, COMPLETE (UACMP) WITH MICROSCOPIC
Bilirubin Urine: NEGATIVE
Glucose, UA: 50 mg/dL — AB
Ketones, ur: NEGATIVE mg/dL
NITRITE: NEGATIVE
PROTEIN: NEGATIVE mg/dL
SPECIFIC GRAVITY, URINE: 1.013 (ref 1.005–1.030)
pH: 6 (ref 5.0–8.0)

## 2016-02-09 MED ORDER — HYDROCODONE-ACETAMINOPHEN 5-325 MG PO TABS: 1.0000 | ORAL_TABLET | ORAL | 0 refills | Status: DC | PRN
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
## 2016-02-09 NOTE — ED Notes (Signed)
Reviewed d/c instructions, follow-up care, prescriptions, use of ice/elevation, splint care, need to abstain from putting any weight on injured foot. Pt verbalized understanding
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
## 2016-03-20 ENCOUNTER — Encounter (HOSPITAL_COMMUNITY): Payer: Self-pay | Admitting: *Deleted
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
## 2016-03-20 NOTE — Progress Notes (Signed)
Spoke with pt for pre-op call. Pt denies cardiac history, chest pain or sob. Pt is not diabetic. 

## 2016-03-21 ENCOUNTER — Encounter (HOSPITAL_COMMUNITY)
Admission: RE | Disposition: A | Discharge status: Home / Self Care | Payer: Self-pay | Source: Ambulatory Visit | Attending: Orthopedic Surgery

## 2016-03-21 ENCOUNTER — Ambulatory Visit (HOSPITAL_COMMUNITY)
Admission: RE | Admit: 2016-03-21 | Discharge: 2016-03-21 | Disposition: A | Discharge status: Home / Self Care | Payer: Managed Care, Other (non HMO) | Source: Ambulatory Visit | Attending: Orthopedic Surgery | Admitting: Orthopedic Surgery

## 2016-03-21 ENCOUNTER — Ambulatory Visit (HOSPITAL_COMMUNITY): Payer: Managed Care, Other (non HMO) | Admitting: Anesthesiology

## 2016-03-21 ENCOUNTER — Encounter (HOSPITAL_COMMUNITY): Payer: Self-pay | Admitting: *Deleted

## 2016-03-21 DIAGNOSIS — Z9049 Acquired absence of other specified parts of digestive tract: Secondary | ICD-10-CM | POA: Diagnosis not present

## 2016-03-21 DIAGNOSIS — Z90710 Acquired absence of both cervix and uterus: Secondary | ICD-10-CM | POA: Diagnosis not present

## 2016-03-21 DIAGNOSIS — F419 Anxiety disorder, unspecified: Secondary | ICD-10-CM | POA: Insufficient documentation

## 2016-03-21 DIAGNOSIS — Z87442 Personal history of urinary calculi: Secondary | ICD-10-CM | POA: Insufficient documentation

## 2016-03-21 DIAGNOSIS — S63418A Traumatic rupture of collateral ligament of other finger at metacarpophalangeal and interphalangeal joint, initial encounter: Secondary | ICD-10-CM | POA: Insufficient documentation

## 2016-03-21 DIAGNOSIS — Q615 Medullary cystic kidney: Secondary | ICD-10-CM | POA: Diagnosis not present

## 2016-03-21 DIAGNOSIS — M1990 Unspecified osteoarthritis, unspecified site: Secondary | ICD-10-CM | POA: Insufficient documentation

## 2016-03-21 DIAGNOSIS — Z833 Family history of diabetes mellitus: Secondary | ICD-10-CM | POA: Diagnosis not present

## 2016-03-21 DIAGNOSIS — Y92410 Unspecified street and highway as the place of occurrence of the external cause: Secondary | ICD-10-CM | POA: Diagnosis not present

## 2016-03-21 DIAGNOSIS — E559 Vitamin D deficiency, unspecified: Secondary | ICD-10-CM | POA: Diagnosis not present

## 2016-03-21 DIAGNOSIS — S60012A Contusion of left thumb without damage to nail, initial encounter: Secondary | ICD-10-CM | POA: Insufficient documentation

## 2016-03-21 DIAGNOSIS — Z87891 Personal history of nicotine dependence: Secondary | ICD-10-CM | POA: Diagnosis not present

## 2016-03-21 DIAGNOSIS — D649 Anemia, unspecified: Secondary | ICD-10-CM | POA: Insufficient documentation

## 2016-03-21 DIAGNOSIS — S63642A Sprain of metacarpophalangeal joint of left thumb, initial encounter: Secondary | ICD-10-CM

## 2016-03-21 DIAGNOSIS — S5330XA Traumatic rupture of unspecified ulnar collateral ligament, initial encounter: Secondary | ICD-10-CM | POA: Diagnosis present

## 2016-03-21 HISTORY — DX: Nausea with vomiting, unspecified: R11.2

## 2016-03-21 HISTORY — DX: Personal history of urinary calculi: Z87.442

## 2016-03-21 HISTORY — DX: Medullary cystic kidney: Q61.5

## 2016-03-21 HISTORY — DX: Nausea with vomiting, unspecified: Z98.890

## 2016-03-21 HISTORY — DX: Unspecified urinary incontinence: R32

## 2016-03-21 HISTORY — DX: Family history of other specified conditions: Z84.89

## 2016-03-21 HISTORY — DX: Anxiety disorder, unspecified: F41.9

## 2016-03-21 HISTORY — DX: Unspecified osteoarthritis, unspecified site: M19.90

## 2016-03-21 HISTORY — PX: ULNAR COLLATERAL LIGAMENT REPAIR: SHX6159

## 2016-03-21 LAB — CBC
HCT: 40.1 % (ref 36.0–46.0)
HEMOGLOBIN: 12.9 g/dL (ref 12.0–15.0)
MCH: 27.4 pg (ref 26.0–34.0)
MCHC: 32.2 g/dL (ref 30.0–36.0)
MCV: 85.1 fL (ref 78.0–100.0)
Platelets: 205 10*3/uL (ref 150–400)
RBC: 4.71 MIL/uL (ref 3.87–5.11)
RDW: 14.2 % (ref 11.5–15.5)
WBC: 9.7 10*3/uL (ref 4.0–10.5)

## 2016-03-21 SURGERY — REPAIR, LIGAMENT, ULNAR COLLATERAL
Anesthesia: Monitor Anesthesia Care | Site: Thumb | Laterality: Left

## 2016-03-21 MED ORDER — DOCUSATE SODIUM 100 MG PO CAPS
100.0000 mg | ORAL_CAPSULE | Freq: Two times a day (BID) | ORAL | 0 refills | Status: DC
Start: 2016-03-21 — End: 2016-08-02

## 2016-03-21 MED ORDER — ONDANSETRON HCL 4 MG/2ML IJ SOLN
INTRAMUSCULAR | Status: DC | PRN
  Administered 2016-03-21: 4 mg via INTRAVENOUS

## 2016-03-21 MED ORDER — HYDROCODONE-ACETAMINOPHEN 10-325 MG PO TABS: 1.0000 | ORAL_TABLET | Freq: Four times a day (QID) | ORAL | 0 refills | Status: DC | PRN

## 2016-03-21 MED ORDER — SCOPOLAMINE 1 MG/3DAYS TD PT72
MEDICATED_PATCH | TRANSDERMAL | Status: AC
  Administered 2016-03-21: 1.5 mg via TRANSDERMAL
  Filled 2016-03-21: qty 1

## 2016-03-21 MED ORDER — ONDANSETRON HCL 4 MG/2ML IJ SOLN
INTRAMUSCULAR | Status: AC
  Filled 2016-03-21: qty 2

## 2016-03-21 MED ORDER — PROPOFOL 10 MG/ML IV BOLUS
INTRAVENOUS | Status: AC
  Filled 2016-03-21: qty 20

## 2016-03-21 MED ORDER — LIDOCAINE HCL (PF) 1 % IJ SOLN
INTRAMUSCULAR | Status: DC | PRN
  Administered 2016-03-21: 30 mL

## 2016-03-21 MED ORDER — FENTANYL CITRATE (PF) 100 MCG/2ML IJ SOLN
INTRAMUSCULAR | Status: AC
  Filled 2016-03-21: qty 2

## 2016-03-21 MED ORDER — LACTATED RINGERS IV SOLN
INTRAVENOUS | Status: DC
  Administered 2016-03-21: 11:00:00 via INTRAVENOUS

## 2016-03-21 MED ORDER — BUPIVACAINE HCL (PF) 0.25 % IJ SOLN
INTRAMUSCULAR | Status: DC | PRN
  Administered 2016-03-21: 30 mL

## 2016-03-21 MED ORDER — MIDAZOLAM HCL 2 MG/2ML IJ SOLN
INTRAMUSCULAR | Status: AC
  Filled 2016-03-21: qty 2

## 2016-03-21 MED ORDER — PROMETHAZINE HCL 25 MG/ML IJ SOLN: 6.2500 mg | INTRAMUSCULAR | Status: DC | PRN

## 2016-03-21 MED ORDER — PROPOFOL 10 MG/ML IV BOLUS
INTRAVENOUS | Status: DC | PRN
  Administered 2016-03-21: 30 mg via INTRAVENOUS

## 2016-03-21 MED ORDER — LIDOCAINE HCL (PF) 1 % IJ SOLN
INTRAMUSCULAR | Status: AC
  Filled 2016-03-21: qty 30

## 2016-03-21 MED ORDER — BUPIVACAINE HCL (PF) 0.25 % IJ SOLN
INTRAMUSCULAR | Status: AC
  Filled 2016-03-21: qty 30

## 2016-03-21 MED ORDER — HYDROMORPHONE HCL 1 MG/ML IJ SOLN: 0.2500 mg | INTRAMUSCULAR | Status: DC | PRN

## 2016-03-21 MED ORDER — PROPOFOL 500 MG/50ML IV EMUL
INTRAVENOUS | Status: DC | PRN
  Administered 2016-03-21: 100 ug/kg/min via INTRAVENOUS

## 2016-03-21 MED ORDER — DEXTROSE 5 % IV SOLN
3.0000 g | INTRAVENOUS | Status: AC
  Administered 2016-03-21: 3 g via INTRAVENOUS
  Filled 2016-03-21: qty 3000

## 2016-03-21 MED ORDER — 0.9 % SODIUM CHLORIDE (POUR BTL) OPTIME
TOPICAL | Status: DC | PRN
  Administered 2016-03-21: 1000 mL

## 2016-03-21 MED ORDER — SCOPOLAMINE 1 MG/3DAYS TD PT72: 1.0000 | MEDICATED_PATCH | TRANSDERMAL | Status: DC

## 2016-03-21 MED ORDER — SCOPOLAMINE 1 MG/3DAYS TD PT72
1.0000 | MEDICATED_PATCH | TRANSDERMAL | Status: DC
  Administered 2016-03-21: 1.5 mg via TRANSDERMAL
  Filled 2016-03-21: qty 1

## 2016-03-21 MED ORDER — MIDAZOLAM HCL 5 MG/5ML IJ SOLN
INTRAMUSCULAR | Status: DC | PRN
  Administered 2016-03-21: 2 mg via INTRAVENOUS

## 2016-03-21 MED ORDER — FENTANYL CITRATE (PF) 100 MCG/2ML IJ SOLN
INTRAMUSCULAR | Status: DC | PRN
  Administered 2016-03-21 (×2): 50 ug via INTRAVENOUS

## 2016-03-21 MED ORDER — CHLORHEXIDINE GLUCONATE 4 % EX LIQD: 60.0000 mL | Freq: Once | CUTANEOUS | Status: DC

## 2016-03-21 SURGICAL SUPPLY — 62 items
ANCHOR FT CORKSCREW MICRO 2-0 (Anchor) ×3 IMPLANT
APPLIER CLIP 9.375 SM OPEN (CLIP)
BAG DECANTER FOR FLEXI CONT (MISCELLANEOUS) ×3 IMPLANT
BANDAGE ACE 3X5.8 VEL STRL LF (GAUZE/BANDAGES/DRESSINGS) ×3 IMPLANT
BANDAGE ACE 4X5 VEL STRL LF (GAUZE/BANDAGES/DRESSINGS) IMPLANT
BANDAGE ELASTIC 3 VELCRO ST LF (GAUZE/BANDAGES/DRESSINGS) IMPLANT
BNDG CONFORM 2 STRL LF (GAUZE/BANDAGES/DRESSINGS) ×3 IMPLANT
BNDG ESMARK 4X9 LF (GAUZE/BANDAGES/DRESSINGS) ×3 IMPLANT
BNDG GAUZE ELAST 4 BULKY (GAUZE/BANDAGES/DRESSINGS) IMPLANT
CLIP APPLIE 9.375 SM OPEN (CLIP) IMPLANT
CLOSURE WOUND 1/2 X4 (GAUZE/BANDAGES/DRESSINGS) ×1
CORDS BIPOLAR (ELECTRODE) ×3 IMPLANT
COVER SURGICAL LIGHT HANDLE (MISCELLANEOUS) ×3 IMPLANT
CUFF TOURNIQUET SINGLE 18IN (TOURNIQUET CUFF) ×3 IMPLANT
CUFF TOURNIQUET SINGLE 24IN (TOURNIQUET CUFF) IMPLANT
DRAPE OEC MINIVIEW 54X84 (DRAPES) ×3 IMPLANT
DRAPE SURG 17X23 STRL (DRAPES) ×3 IMPLANT
DRSG ADAPTIC 3X8 NADH LF (GAUZE/BANDAGES/DRESSINGS) IMPLANT
DRSG EMULSION OIL 3X3 NADH (GAUZE/BANDAGES/DRESSINGS) ×3 IMPLANT
GAUZE SPONGE 4X4 12PLY STRL (GAUZE/BANDAGES/DRESSINGS) IMPLANT
GAUZE XEROFORM 5X9 LF (GAUZE/BANDAGES/DRESSINGS) IMPLANT
GEL ULTRASOUND 20GR AQUASONIC (MISCELLANEOUS) IMPLANT
GLOVE BIOGEL PI IND STRL 7.0 (GLOVE) ×1 IMPLANT
GLOVE BIOGEL PI IND STRL 8.5 (GLOVE) ×1 IMPLANT
GLOVE BIOGEL PI INDICATOR 7.0 (GLOVE) ×2
GLOVE BIOGEL PI INDICATOR 8.5 (GLOVE) ×2
GLOVE SURG ORTHO 8.0 STRL STRW (GLOVE) ×6 IMPLANT
GLOVE SURG SS PI 6.5 STRL IVOR (GLOVE) ×3 IMPLANT
GOWN STRL REUS W/ TWL LRG LVL3 (GOWN DISPOSABLE) ×2 IMPLANT
GOWN STRL REUS W/ TWL XL LVL3 (GOWN DISPOSABLE) ×1 IMPLANT
GOWN STRL REUS W/TWL LRG LVL3 (GOWN DISPOSABLE) ×4
GOWN STRL REUS W/TWL XL LVL3 (GOWN DISPOSABLE) ×2
KIT BASIN OR (CUSTOM PROCEDURE TRAY) ×3 IMPLANT
KIT ROOM TURNOVER OR (KITS) ×3 IMPLANT
LOOP VESSEL MAXI BLUE (MISCELLANEOUS) ×3 IMPLANT
MANIFOLD NEPTUNE II (INSTRUMENTS) ×3 IMPLANT
NEEDLE HYPO 25GX1X1/2 BEV (NEEDLE) ×3 IMPLANT
NS IRRIG 1000ML POUR BTL (IV SOLUTION) ×3 IMPLANT
PACK ORTHO EXTREMITY (CUSTOM PROCEDURE TRAY) ×3 IMPLANT
PAD ARMBOARD 7.5X6 YLW CONV (MISCELLANEOUS) ×6 IMPLANT
PAD CAST 4YDX4 CTTN HI CHSV (CAST SUPPLIES) ×1 IMPLANT
PADDING CAST COTTON 4X4 STRL (CAST SUPPLIES) ×2
SOAP 2 % CHG 4 OZ (WOUND CARE) ×3 IMPLANT
SPEAR EYE SURG WECK-CEL (MISCELLANEOUS) IMPLANT
SPLINT FIBERGLASS 3X12 (CAST SUPPLIES) ×3 IMPLANT
SPONGE GAUZE 4X4 12PLY STER LF (GAUZE/BANDAGES/DRESSINGS) ×3 IMPLANT
STRIP CLOSURE SKIN 1/2X4 (GAUZE/BANDAGES/DRESSINGS) ×2 IMPLANT
SUCTION FRAZIER HANDLE 10FR (MISCELLANEOUS) ×2
SUCTION TUBE FRAZIER 10FR DISP (MISCELLANEOUS) ×1 IMPLANT
SUT FIBERWIRE 4-0 18 DIAM BLUE (SUTURE)
SUT MERSILENE 4 0 P 3 (SUTURE) IMPLANT
SUT MNCRL AB 4-0 PS2 18 (SUTURE) ×3 IMPLANT
SUT PROLENE 4 0 PS 2 18 (SUTURE) ×3 IMPLANT
SUT VIC AB 3-0 FS2 27 (SUTURE) ×3 IMPLANT
SUTURE FIBERWR 4-0 18 DIA BLUE (SUTURE) IMPLANT
SYR CONTROL 10ML LL (SYRINGE) ×3 IMPLANT
TOWEL OR 17X24 6PK STRL BLUE (TOWEL DISPOSABLE) ×3 IMPLANT
TOWEL OR 17X26 10 PK STRL BLUE (TOWEL DISPOSABLE) ×3 IMPLANT
TUBE CONNECTING 12'X1/4 (SUCTIONS) ×1
TUBE CONNECTING 12X1/4 (SUCTIONS) ×2 IMPLANT
UNDERPAD 30X30 (UNDERPADS AND DIAPERS) ×3 IMPLANT
WATER STERILE IRR 1000ML POUR (IV SOLUTION) ×3 IMPLANT

## 2016-03-21 NOTE — Anesthesia Procedure Notes (Signed)
Procedure Name: MAC Date/Time: 03/21/2016 1:28 PM Performed by: Kyung Rudd Pre-anesthesia Checklist: Patient identified, Emergency Drugs available, Suction available and Patient being monitored Patient Re-evaluated:Patient Re-evaluated prior to inductionOxygen Delivery Method: Simple face mask Intubation Type: IV induction Placement Confirmation: positive ETCO2

## 2016-03-21 NOTE — Anesthesia Postprocedure Evaluation (Addendum)
Anesthesia Post Note  Patient: Leslie Morris  Procedure(s) Performed: Procedure(s) (LRB): Left thumb ulnar collateral ligament repair (Left)  Patient location during evaluation: PACU Anesthesia Type: MAC Level of consciousness: awake and alert Pain management: pain level controlled Vital Signs Assessment: post-procedure vital signs reviewed and stable Respiratory status: spontaneous breathing and respiratory function stable Cardiovascular status: stable Anesthetic complications: no       Last Vitals:  Vitals:   03/21/16 1448 03/21/16 1457  BP:  113/76  Pulse: 66 66  Resp: 20 20  Temp: 36.1 C     Last Pain:  Vitals:   03/21/16 1457  TempSrc:   PainSc: 0-No pain                 Brissia Delisa DANIEL

## 2016-03-21 NOTE — Anesthesia Preprocedure Evaluation (Signed)
Anesthesia Evaluation  Patient identified by MRN, date of birth, ID band Patient awake    Reviewed: Allergy & Precautions, NPO status , Patient's Chart, lab work & pertinent test results  History of Anesthesia Complications (+) PONV and history of anesthetic complications  Airway Mallampati: III  TM Distance: >3 FB Neck ROM: Full    Dental no notable dental hx. (+) Dental Advisory Given   Pulmonary former smoker,    Pulmonary exam normal        Cardiovascular negative cardio ROS Normal cardiovascular exam     Neuro/Psych Anxiety negative neurological ROS     GI/Hepatic negative GI ROS, Neg liver ROS,   Endo/Other  Morbid obesity  Renal/GU negative Renal ROS     Musculoskeletal   Abdominal   Peds  Hematology   Anesthesia Other Findings   Reproductive/Obstetrics                             Anesthesia Physical Anesthesia Plan  ASA: III  Anesthesia Plan: MAC   Post-op Pain Management:    Induction:   Airway Management Planned: Natural Airway and Simple Face Mask  Additional Equipment:   Intra-op Plan:   Post-operative Plan:   Informed Consent: I have reviewed the patients History and Physical, chart, labs and discussed the procedure including the risks, benefits and alternatives for the proposed anesthesia with the patient or authorized representative who has indicated his/her understanding and acceptance.   Dental advisory given  Plan Discussed with: CRNA, Anesthesiologist and Surgeon  Anesthesia Plan Comments:         Anesthesia Quick Evaluation

## 2016-03-21 NOTE — Discharge Instructions (Signed)
KEEP BANDAGE CLEAN AND DRY °CALL OFFICE FOR F/U APPT [phone] °KEEP HAND ELEVATED ABOVE HEART °OK TO APPLY ICE TO OPERATIVE AREA °CONTACT OFFICE IF ANY WORSENING PAIN OR CONCERNS. °

## 2016-03-21 NOTE — Brief Op Note (Signed)
Job ID [id number] Left thumb UCL ligament repair, mp joint Home today  F/u in office in 14 days

## 2016-03-21 NOTE — Transfer of Care (Signed)
Immediate Anesthesia Transfer of Care Note  Patient: Leslie Morris  Procedure(s) Performed: Procedure(s): Left thumb ulnar collateral ligament repair (Left)  Patient Location: PACU  Anesthesia Type:MAC  Level of Consciousness: awake, alert  and oriented  Airway & Oxygen Therapy: Patient Spontanous Breathing and Patient connected to nasal cannula oxygen  Post-op Assessment: Report given to RN, Post -op Vital signs reviewed and stable and Patient moving all extremities  Post vital signs: Reviewed and stable  Last Vitals:  Vitals:   03/21/16 1048 03/21/16 1053  BP: (!) 153/111 118/82  Pulse: 75   Resp: 18   Temp: 36.8 C     Last Pain:  Vitals:   03/21/16 1109  TempSrc:   PainSc: 7          Complications: No apparent anesthesia complications

## 2016-03-22 ENCOUNTER — Encounter (HOSPITAL_COMMUNITY): Payer: Self-pay | Admitting: Orthopedic Surgery
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
## 2016-03-22 NOTE — Op Note (Deleted)
  The note originally documented on this encounter has been moved the the encounter in which it belongs.  

## 2016-04-01 ENCOUNTER — Other Ambulatory Visit: Payer: Self-pay | Admitting: Physician Assistant

## 2016-04-01 DIAGNOSIS — N393 Stress incontinence (female) (male): Secondary | ICD-10-CM

## 2016-04-01 NOTE — Telephone Encounter (Signed)
Med refill for ditropan approved

## 2016-07-13 NOTE — Addendum Note (Signed)
Addendum  created 07/13/16 0927 by Duane Boston, MD   Sign clinical note

## 2016-08-02 ENCOUNTER — Encounter: Payer: Self-pay | Admitting: Physician Assistant

## 2016-08-02 ENCOUNTER — Ambulatory Visit: Payer: Self-pay | Admitting: Physician Assistant

## 2016-08-02 VITALS — BP 130/80 | HR 76 | Temp 98.5°F | Resp 16 | Ht 67.0 in | Wt 387.0 lb

## 2016-08-02 DIAGNOSIS — Z0189 Encounter for other specified special examinations: Secondary | ICD-10-CM

## 2016-08-02 DIAGNOSIS — Z0000 Encounter for general adult medical examination without abnormal findings: Secondary | ICD-10-CM

## 2016-08-02 DIAGNOSIS — Z008 Encounter for other general examination: Secondary | ICD-10-CM

## 2016-08-02 MED ORDER — VITAMIN D (ERGOCALCIFEROL) 1.25 MG (50000 UNIT) PO CAPS: 50000.0000 [IU] | ORAL_CAPSULE | ORAL | 3 refills | Status: DC
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
## 2016-08-03 LAB — CMP12+LP+TP+TSH+6AC+CBC/D/PLT
ALBUMIN: 4.2 g/dL (ref 3.5–5.5)
ALK PHOS: 78 IU/L (ref 39–117)
ALT: 32 IU/L (ref 0–32)
AST: 42 IU/L — AB (ref 0–40)
Albumin/Globulin Ratio: 1.5 (ref 1.2–2.2)
BASOS: 0 %
BILIRUBIN TOTAL: 0.6 mg/dL (ref 0.0–1.2)
BUN / CREAT RATIO: 17 (ref 9–23)
BUN: 12 mg/dL (ref 6–24)
Basophils Absolute: 0 10*3/uL (ref 0.0–0.2)
CHLORIDE: 101 mmol/L (ref 96–106)
CHOL/HDL RATIO: 4.3 ratio (ref 0.0–4.4)
CHOLESTEROL TOTAL: 185 mg/dL (ref 100–199)
Calcium: 9 mg/dL (ref 8.7–10.2)
Creatinine, Ser: 0.72 mg/dL (ref 0.57–1.00)
EOS (ABSOLUTE): 0.2 10*3/uL (ref 0.0–0.4)
EOS: 2 %
ESTIMATED CHD RISK: 1 times avg. (ref 0.0–1.0)
FREE THYROXINE INDEX: 2.1 (ref 1.2–4.9)
GFR calc Af Amer: 119 mL/min/{1.73_m2} (ref >59)
GFR calc non Af Amer: 103 mL/min/{1.73_m2} (ref >59)
GGT: 21 IU/L (ref 0–60)
GLUCOSE: 131 mg/dL — AB (ref 65–99)
Globulin, Total: 2.8 g/dL (ref 1.5–4.5)
HDL: 43 mg/dL (ref >39)
HEMATOCRIT: 39.7 % (ref 34.0–46.6)
HEMOGLOBIN: 13 g/dL (ref 11.1–15.9)
IMMATURE GRANS (ABS): 0 10*3/uL (ref 0.0–0.1)
IMMATURE GRANULOCYTES: 0 %
Iron: 81 ug/dL (ref 27–159)
LDH: 257 IU/L — AB (ref 119–226)
LDL CALC: 104 mg/dL — AB (ref 0–99)
LYMPHS ABS: 2.2 10*3/uL (ref 0.7–3.1)
LYMPHS: 25 %
MCH: 28.3 pg (ref 26.6–33.0)
MCHC: 32.7 g/dL (ref 31.5–35.7)
MCV: 86 fL (ref 79–97)
Monocytes Absolute: 0.3 10*3/uL (ref 0.1–0.9)
Monocytes: 4 %
NEUTROS PCT: 69 %
Neutrophils Absolute: 6 10*3/uL (ref 1.4–7.0)
Phosphorus: 3 mg/dL (ref 2.5–4.5)
Platelets: 231 10*3/uL (ref 150–379)
Potassium: 4.6 mmol/L (ref 3.5–5.2)
RBC: 4.6 x10E6/uL (ref 3.77–5.28)
RDW: 14.9 % (ref 12.3–15.4)
SODIUM: 141 mmol/L (ref 134–144)
T3 Uptake Ratio: 23 % — ABNORMAL LOW (ref 24–39)
T4, Total: 9.1 ug/dL (ref 4.5–12.0)
TSH: 2.91 u[IU]/mL (ref 0.450–4.500)
Total Protein: 7 g/dL (ref 6.0–8.5)
Triglycerides: 188 mg/dL — ABNORMAL HIGH (ref 0–149)
URIC ACID: 6.3 mg/dL (ref 2.5–7.1)
VLDL CHOLESTEROL CAL: 38 mg/dL (ref 5–40)
WBC: 8.7 10*3/uL (ref 3.4–10.8)

## 2016-08-03 LAB — VITAMIN D 25 HYDROXY (VIT D DEFICIENCY, FRACTURES): Vit D, 25-Hydroxy: 16.5 ng/mL — ABNORMAL LOW (ref 30.0–100.0)

## 2016-08-05 ENCOUNTER — Other Ambulatory Visit: Payer: Self-pay | Admitting: Physician Assistant

## 2016-08-05 DIAGNOSIS — Z0000 Encounter for general adult medical examination without abnormal findings: Secondary | ICD-10-CM

## 2016-08-05 MED ORDER — VITAMIN D (ERGOCALCIFEROL) 1.25 MG (50000 UNIT) PO CAPS: 50000.0000 [IU] | ORAL_CAPSULE | ORAL | 3 refills | Status: DC

## 2016-08-06 ENCOUNTER — Other Ambulatory Visit: Payer: Self-pay | Admitting: Physician Assistant

## 2016-08-06 MED ORDER — LANCETS THIN MISC: 12 refills | Status: DC

## 2016-08-06 MED ORDER — BLOOD GLUCOSE TEST VI STRP: ORAL_STRIP | 12 refills | Status: DC

## 2016-08-06 MED ORDER — METFORMIN HCL 500 MG PO TABS: 500.0000 mg | ORAL_TABLET | Freq: Two times a day (BID) | ORAL | 6 refills | Status: DC

## 2016-08-06 NOTE — Progress Notes (Signed)
metform

## 2016-08-11 LAB — HGB A1C W/O EAG: Hgb A1c MFr Bld: 7.1 % — ABNORMAL HIGH (ref 4.8–5.6)

## 2016-08-11 LAB — SPECIMEN STATUS REPORT

## 2016-09-19 ENCOUNTER — Encounter: Payer: Self-pay | Admitting: Physician Assistant

## 2016-09-19 ENCOUNTER — Ambulatory Visit: Payer: Self-pay | Admitting: Physician Assistant

## 2016-09-19 VITALS — BP 120/84 | HR 72 | Temp 98.4°F

## 2016-09-19 DIAGNOSIS — L03012 Cellulitis of left finger: Secondary | ICD-10-CM

## 2016-09-19 MED ORDER — SULFAMETHOXAZOLE-TRIMETHOPRIM 800-160 MG PO TABS: 1.0000 | ORAL_TABLET | Freq: Two times a day (BID) | ORAL | 0 refills | Status: DC

## 2016-09-19 NOTE — Progress Notes (Signed)
S: c/o finger hurting and having pus on the side, drained last night while in shower, now pus has come back, no fever/chills Glucose is running 98-130  O: vitals wnl, nad, skin on finger with red area and pus beside cuticle, tender, full rom, n/v intact, used etoh wipe, 18g needle, drained pus, applied bandaid  A: paronychia  P: septra ds 1 po bid x 7d, warm water soaks

## 2016-09-25 ENCOUNTER — Ambulatory Visit: Payer: Self-pay | Admitting: Physician Assistant

## 2016-09-25 DIAGNOSIS — Z299 Encounter for prophylactic measures, unspecified: Secondary | ICD-10-CM

## 2016-09-25 NOTE — Progress Notes (Signed)
Patient came in to have blood drawn for testing per Susan's authorization.  Patient wants results sent to Dr. Apolonio Schneiders at [facility].

## 2016-09-26 LAB — CBC WITH DIFFERENTIAL/PLATELET
BASOS ABS: 0 10*3/uL (ref 0.0–0.2)
BASOS: 0 %
EOS (ABSOLUTE): 0.2 10*3/uL (ref 0.0–0.4)
Eos: 2 %
HEMATOCRIT: 39.6 % (ref 34.0–46.6)
HEMOGLOBIN: 12.8 g/dL (ref 11.1–15.9)
Immature Grans (Abs): 0 10*3/uL (ref 0.0–0.1)
Immature Granulocytes: 0 %
LYMPHS ABS: 2 10*3/uL (ref 0.7–3.1)
Lymphs: 27 %
MCH: 27.2 pg (ref 26.6–33.0)
MCHC: 32.3 g/dL (ref 31.5–35.7)
MCV: 84 fL (ref 79–97)
MONOCYTES: 7 %
Monocytes Absolute: 0.5 10*3/uL (ref 0.1–0.9)
NEUTROS ABS: 4.7 10*3/uL (ref 1.4–7.0)
Neutrophils: 64 %
Platelets: 240 10*3/uL (ref 150–379)
RBC: 4.71 x10E6/uL (ref 3.77–5.28)
RDW: 15.3 % (ref 12.3–15.4)
WBC: 7.5 10*3/uL (ref 3.4–10.8)

## 2016-09-26 LAB — HGB A1C W/O EAG: Hgb A1c MFr Bld: 6.8 % — ABNORMAL HIGH (ref 4.8–5.6)

## 2016-09-26 LAB — BASIC METABOLIC PANEL
BUN / CREAT RATIO: 15 (ref 9–23)
BUN: 14 mg/dL (ref 6–24)
CO2: 21 mmol/L (ref 20–29)
CREATININE: 0.91 mg/dL (ref 0.57–1.00)
Calcium: 9.4 mg/dL (ref 8.7–10.2)
Chloride: 100 mmol/L (ref 96–106)
GFR, EST AFRICAN AMERICAN: 89 mL/min/{1.73_m2} (ref >59)
GFR, EST NON AFRICAN AMERICAN: 78 mL/min/{1.73_m2} (ref >59)
Glucose: 121 mg/dL — ABNORMAL HIGH (ref 65–99)
Potassium: 4.9 mmol/L (ref 3.5–5.2)
SODIUM: 139 mmol/L (ref 134–144)

## 2016-10-03 ENCOUNTER — Other Ambulatory Visit: Payer: Self-pay | Admitting: Physician Assistant

## 2016-10-03 MED ORDER — CLINDAMYCIN HCL 300 MG PO CAPS: 300.0000 mg | ORAL_CAPSULE | Freq: Three times a day (TID) | ORAL | 0 refills | Status: DC

## 2016-10-03 NOTE — Progress Notes (Unsigned)
Pt still has redness and swelling at finger, some crusting at the edge, painful, sent a pic from her cellphone which showed continued and spreading redness, sent in rx for clindamycin, recheck in 2 days

## 2016-10-10 NOTE — H&P (Signed)
Leslie Morris is an 46 y.o. female.   Chief Complaint: LEFT CARPAL TUNNEL SYNDROME  HPI: [name] IS A 45 Y/O RIGHT HAND DOMINANT FEMALE WHO HAS HAD A PREVIOUS ULNAR COLLATERAL LIGAMENT REPAIR OR THE LEFT THUMB ON 03/21/16 BUT HAS BEEN EXPERIENCING NUMBNESS AND TINGLING THROUGHOUT THE MEDIAN NERVE DISTRIBUTION OF THE LEFT HAND.  SHE HAD A NERVE CONDUCTION STUDY IN July 2018 THAT REVEALED MODERATE CARPAL TUNNEL SYNDROME IN THE LEFT SIDE.  DISCUSSED TREATMENT OPTIONS WITH THE PATIENT. DUE TO HER BEING NEWLY DIABETIC AND STILL TRYING TO GET HER LEVELS UNDER CONTROL, HER PRIMARY CARE DOCTOR DOES NOT WANT HER TO HAVE ANY CORTISONE INJECTIONS. DISCUSSED SURGICAL RELEASE OF THE CARPAL CANAL.  DISCUSSED THE SURGICAL PROCEDURE, INCLUDING THE RISKS VERSUS BENEFITS, AND THE POST-OPERATIVE RECOVERY.  THE PATIENT IS HERE TODAY FOR SURGERY.   Past Medical History:  Diagnosis Date  . Anemia   . Anxiety   . Arthritis   . Chicken pox   . Family history of adverse reaction to anesthesia    Leslie Morris is slow to wake up and n/v  . History of kidney stones   . Medullary sponge kidney   . PONV (postoperative nausea and vomiting)   . Urinary incontinence    due to Medullary sponge kidney  . Vitamin D deficiency     Past Surgical History:  Procedure Laterality Date  . ABDOMINAL HYSTERECTOMY    . ANKLE SURGERY     Left from trauma  x 2  . APPENDECTOMY    . CHOLECYSTECTOMY    . COLONOSCOPY    . HERNIA REPAIR     umbilical hernia repair  . KIDNEY STONE SURGERY    . TONSILLECTOMY    . ULNAR COLLATERAL LIGAMENT REPAIR Left 03/21/2016   Procedure: Left thumb ulnar collateral ligament repair;  Surgeon: Iran Planas, MD;  Location: [facility];  Service: Orthopedics;  Laterality: Left;    Family History  Problem Relation Age of Onset  . Diabetes Father   . Cancer Maternal Aunt        cervical cancer  . Cancer Paternal Aunt        breast cancer  . Heart disease Neg Hx   . Hyperlipidemia Neg Hx   . Hypertension Neg  Hx   . Stroke Neg Hx    Social History:  reports that she quit smoking about 6 years ago. She has never used smokeless tobacco. She reports that she does not drink alcohol or use drugs.  Allergies:  Allergies  Allergen Reactions  . No Known Allergies     No prescriptions prior to admission.    No results found for this or any previous visit (from the past 48 hour(s)). No results found.  ROS NO RECENT ILLNESSES OR HOSPITALIZATIONS  There were no vitals taken for this visit. Physical Exam  Physical Exam  \ General Appearance:  Alert, cooperative, no distress, appears stated age  Head:  Normocephalic, without obvious abnormality, atraumatic  Eyes:  Pupils equal, conjunctiva/corneas clear,         Throat: Lips, mucosa, and tongue normal; teeth and gums normal  Neck: No visible masses     Lungs:   respirations unlabored  Chest Wall:  No tenderness or deformity  Heart:  Regular rate and rhythm,  Abdomen:   Soft, non-tender,         Extremities: LUE: SURGICAL SCAR ON LEFT THUMB FROM PREVIOUS SURGERY THAT IS MILDLY TENDER TO PALPATION. NEUROVASCULARLY INTACT GROSSLY. CAPILLARY REFILL LESS  THAN 2 SECONDS. ABLE TO MAKE A FULL FIST, CROSS FINGERS, AND ABDUCT THUMB. POSITIVE PHALEN'S TEST  Pulses: 2+ and symmetric  Skin: Skin color, texture, turgor normal, no rashes or lesions     Neurologic: Normal    Assessment LEFT CARPAL TUNNEL SYNDROME  Plan LEFT CARPAL TUNNEL RELEASE  R/B/A DISCUSSED WITH PT IN OFFICE.  PT VOICED UNDERSTANDING OF PLAN CONSENT SIGNED DAY OF SURGERY PT SEEN AND EXAMINED PRIOR TO OPERATIVE PROCEDURE/DAY OF SURGERY SITE MARKED. QUESTIONS ANSWERED WILL GO HOME FOLLOWING SURGERY  WE ARE PLANNING SURGERY FOR YOUR UPPER EXTREMITY. THE RISKS AND BENEFITS OF SURGERY INCLUDE BUT NOT LIMITED TO BLEEDING INFECTION, DAMAGE TO NEARBY NERVES ARTERIES TENDONS, FAILURE OF SURGERY TO ACCOMPLISH ITS INTENDED GOALS, PERSISTENT SYMPTOMS AND NEED FOR FURTHER SURGICAL  INTERVENTION. WITH THIS IN MIND WE WILL PROCEED. I HAVE DISCUSSED WITH THE PATIENT THE PRE AND POSTOPERATIVE REGIMEN AND THE DOS AND DON'TS. PT VOICED UNDERSTANDING AND INFORMED CONSENT SIGNED.   Brynda Peon 10/10/2016, 8:18 AM

## 2016-10-21 ENCOUNTER — Encounter (HOSPITAL_BASED_OUTPATIENT_CLINIC_OR_DEPARTMENT_OTHER): Payer: Self-pay | Admitting: *Deleted

## 2016-10-21 NOTE — Progress Notes (Signed)
Spoke w/ pt via phone ,  Pre-op call completed.  Informed pt that due to anesthesia guidelines bmi over 50 , pt goes to main or.  Pt verbalized understanding.  Called and spoke w/ laurie, or scheduler for dr Caralyn Guile, about needing to move pt to main or due to bmi 54.82.

## 2016-10-23 ENCOUNTER — Encounter (HOSPITAL_COMMUNITY): Payer: Self-pay | Admitting: *Deleted

## 2016-10-23 NOTE — Progress Notes (Signed)
Spoke with pt for pre-op call. Pt is diabetic, type 2. Last A1C was 6.8 on 09/25/16. Pt states her fasting blood sugar usually runs between 98-120. Pt instructed to check blood sugar in the AM when she wakes up and every 2 hours until she leaves for the hospital. If blood sugar is 70 or below, treat with 1/2 cup of clear juice (apple or cranberry) and recheck blood sugar 15 minutes after drinking juice. If blood sugar continues to be 70 or below, call the Short Stay department and ask to speak to a nurse. Pt voiced understanding.

## 2016-10-24 ENCOUNTER — Encounter (HOSPITAL_COMMUNITY): Payer: Self-pay | Admitting: *Deleted

## 2016-10-24 ENCOUNTER — Encounter (HOSPITAL_COMMUNITY)
Admission: RE | Disposition: A | Discharge status: Home / Self Care | Payer: Self-pay | Source: Ambulatory Visit | Attending: Orthopedic Surgery

## 2016-10-24 ENCOUNTER — Ambulatory Visit (HOSPITAL_COMMUNITY): Payer: Managed Care, Other (non HMO) | Admitting: Certified Registered Nurse Anesthetist

## 2016-10-24 ENCOUNTER — Ambulatory Visit (HOSPITAL_COMMUNITY)
Admission: RE | Admit: 2016-10-24 | Discharge: 2016-10-24 | Disposition: A | Discharge status: Home / Self Care | Payer: Managed Care, Other (non HMO) | Source: Ambulatory Visit | Attending: Orthopedic Surgery | Admitting: Orthopedic Surgery

## 2016-10-24 DIAGNOSIS — Z6841 Body Mass Index (BMI) 40.0 and over, adult: Secondary | ICD-10-CM | POA: Insufficient documentation

## 2016-10-24 DIAGNOSIS — G5602 Carpal tunnel syndrome, left upper limb: Secondary | ICD-10-CM | POA: Insufficient documentation

## 2016-10-24 DIAGNOSIS — Z87891 Personal history of nicotine dependence: Secondary | ICD-10-CM | POA: Diagnosis not present

## 2016-10-24 DIAGNOSIS — Z79899 Other long term (current) drug therapy: Secondary | ICD-10-CM | POA: Insufficient documentation

## 2016-10-24 DIAGNOSIS — Z7984 Long term (current) use of oral hypoglycemic drugs: Secondary | ICD-10-CM | POA: Diagnosis not present

## 2016-10-24 DIAGNOSIS — F419 Anxiety disorder, unspecified: Secondary | ICD-10-CM | POA: Diagnosis not present

## 2016-10-24 DIAGNOSIS — Z87442 Personal history of urinary calculi: Secondary | ICD-10-CM | POA: Insufficient documentation

## 2016-10-24 DIAGNOSIS — E119 Type 2 diabetes mellitus without complications: Secondary | ICD-10-CM | POA: Insufficient documentation

## 2016-10-24 HISTORY — DX: Unspecified tear of unspecified meniscus, current injury, right knee, initial encounter: S83.206A

## 2016-10-24 HISTORY — DX: Other enthesopathy of left foot and ankle: M77.52

## 2016-10-24 HISTORY — DX: Type 2 diabetes mellitus without complications: E11.9

## 2016-10-24 HISTORY — DX: Carpal tunnel syndrome, left upper limb: G56.02

## 2016-10-24 HISTORY — DX: Body Mass Index (BMI) 40.0 and over, adult: Z684

## 2016-10-24 HISTORY — PX: CARPAL TUNNEL RELEASE: SHX101

## 2016-10-24 LAB — BASIC METABOLIC PANEL
Anion gap: 10 (ref 5–15)
BUN: 16 mg/dL (ref 6–20)
CALCIUM: 9.1 mg/dL (ref 8.9–10.3)
CO2: 19 mmol/L — ABNORMAL LOW (ref 22–32)
CREATININE: 0.72 mg/dL (ref 0.44–1.00)
Chloride: 107 mmol/L (ref 101–111)
GFR calc Af Amer: >60 mL/min (ref >60)
GLUCOSE: 98 mg/dL (ref 65–99)
Potassium: 4.3 mmol/L (ref 3.5–5.1)
Sodium: 136 mmol/L (ref 135–145)

## 2016-10-24 LAB — GLUCOSE, CAPILLARY
GLUCOSE-CAPILLARY: 105 mg/dL — AB (ref 65–99)
GLUCOSE-CAPILLARY: 92 mg/dL (ref 65–99)
Glucose-Capillary: 98 mg/dL (ref 65–99)

## 2016-10-24 LAB — CBC
HCT: 40 % (ref 36.0–46.0)
Hemoglobin: 12.7 g/dL (ref 12.0–15.0)
MCH: 26.8 pg (ref 26.0–34.0)
MCHC: 31.8 g/dL (ref 30.0–36.0)
MCV: 84.4 fL (ref 78.0–100.0)
Platelets: 248 10*3/uL (ref 150–400)
RBC: 4.74 MIL/uL (ref 3.87–5.11)
RDW: 14.7 % (ref 11.5–15.5)
WBC: 10.4 10*3/uL (ref 4.0–10.5)

## 2016-10-24 SURGERY — CARPAL TUNNEL RELEASE
Anesthesia: Choice | Laterality: Left

## 2016-10-24 SURGERY — CARPAL TUNNEL RELEASE
Anesthesia: Monitor Anesthesia Care | Site: Wrist | Laterality: Left

## 2016-10-24 MED ORDER — MIDAZOLAM HCL 2 MG/2ML IJ SOLN
INTRAMUSCULAR | Status: AC
  Filled 2016-10-24: qty 2

## 2016-10-24 MED ORDER — LIDOCAINE HCL 1 % IJ SOLN
INTRAMUSCULAR | Status: DC | PRN
  Administered 2016-10-24: 10 mL

## 2016-10-24 MED ORDER — MEPERIDINE HCL 25 MG/ML IJ SOLN: 6.2500 mg | INTRAMUSCULAR | Status: DC | PRN

## 2016-10-24 MED ORDER — MIDAZOLAM HCL 2 MG/2ML IJ SOLN
INTRAMUSCULAR | Status: DC | PRN
  Administered 2016-10-24: 2 mg via INTRAVENOUS

## 2016-10-24 MED ORDER — PROPOFOL 10 MG/ML IV BOLUS
INTRAVENOUS | Status: DC | PRN
  Administered 2016-10-24 (×6): 20 mg via INTRAVENOUS

## 2016-10-24 MED ORDER — 0.9 % SODIUM CHLORIDE (POUR BTL) OPTIME
TOPICAL | Status: DC | PRN
  Administered 2016-10-24: 1000 mL

## 2016-10-24 MED ORDER — CEFAZOLIN SODIUM-DEXTROSE 2-4 GM/100ML-% IV SOLN
2.0000 g | INTRAVENOUS | Status: AC
  Administered 2016-10-24: 3 g via INTRAVENOUS
  Filled 2016-10-24: qty 100

## 2016-10-24 MED ORDER — PROPOFOL 500 MG/50ML IV EMUL
INTRAVENOUS | Status: DC | PRN
Start: 2016-10-24 — End: 2016-10-24
  Administered 2016-10-24: 100 ug/kg/min via INTRAVENOUS

## 2016-10-24 MED ORDER — LIDOCAINE HCL (PF) 1 % IJ SOLN
INTRAMUSCULAR | Status: AC
  Filled 2016-10-24: qty 30

## 2016-10-24 MED ORDER — FENTANYL CITRATE (PF) 250 MCG/5ML IJ SOLN
INTRAMUSCULAR | Status: AC
  Filled 2016-10-24: qty 5

## 2016-10-24 MED ORDER — CEFAZOLIN SODIUM 1 G IJ SOLR
INTRAMUSCULAR | Status: AC
  Filled 2016-10-24: qty 10

## 2016-10-24 MED ORDER — CEFAZOLIN SODIUM-DEXTROSE 1-4 GM/50ML-% IV SOLN: INTRAVENOUS | Status: DC | PRN

## 2016-10-24 MED ORDER — BUPIVACAINE-EPINEPHRINE (PF) 0.5% -1:200000 IJ SOLN
INTRAMUSCULAR | Status: DC | PRN
  Administered 2016-10-24: 10 mL

## 2016-10-24 MED ORDER — BUPIVACAINE HCL (PF) 0.5 % IJ SOLN
INTRAMUSCULAR | Status: AC
  Filled 2016-10-24: qty 10

## 2016-10-24 MED ORDER — ONDANSETRON HCL 4 MG/2ML IJ SOLN
INTRAMUSCULAR | Status: DC | PRN
  Administered 2016-10-24: 4 mg via INTRAVENOUS

## 2016-10-24 MED ORDER — PROPOFOL 1000 MG/100ML IV EMUL
INTRAVENOUS | Status: AC
  Filled 2016-10-24: qty 100

## 2016-10-24 MED ORDER — ONDANSETRON HCL 4 MG/2ML IJ SOLN: 4.0000 mg | Freq: Once | INTRAMUSCULAR | Status: DC | PRN

## 2016-10-24 MED ORDER — LACTATED RINGERS IV SOLN
INTRAVENOUS | Status: DC
  Administered 2016-10-24: 11:00:00 via INTRAVENOUS

## 2016-10-24 MED ORDER — FENTANYL CITRATE (PF) 100 MCG/2ML IJ SOLN: 25.0000 ug | INTRAMUSCULAR | Status: DC | PRN

## 2016-10-24 MED ORDER — CHLORHEXIDINE GLUCONATE 4 % EX LIQD: 60.0000 mL | Freq: Once | CUTANEOUS | Status: DC

## 2016-10-24 SURGICAL SUPPLY — 45 items
BANDAGE ACE 3X5.8 VEL STRL LF (GAUZE/BANDAGES/DRESSINGS) ×3 IMPLANT
BANDAGE ACE 4X5 VEL STRL LF (GAUZE/BANDAGES/DRESSINGS) ×3 IMPLANT
BNDG ESMARK 4X9 LF (GAUZE/BANDAGES/DRESSINGS) ×3 IMPLANT
BNDG GAUZE ELAST 4 BULKY (GAUZE/BANDAGES/DRESSINGS) ×3 IMPLANT
CORDS BIPOLAR (ELECTRODE) ×3 IMPLANT
COVER SURGICAL LIGHT HANDLE (MISCELLANEOUS) ×3 IMPLANT
CUFF TOURNIQUET SINGLE 18IN (TOURNIQUET CUFF) ×3 IMPLANT
CUFF TOURNIQUET SINGLE 24IN (TOURNIQUET CUFF) IMPLANT
DRAPE SURG 17X23 STRL (DRAPES) ×3 IMPLANT
EVACUATOR 1/8 PVC DRAIN (DRAIN) IMPLANT
GAUZE SPONGE 4X4 12PLY STRL (GAUZE/BANDAGES/DRESSINGS) ×3 IMPLANT
GAUZE XEROFORM 1X8 LF (GAUZE/BANDAGES/DRESSINGS) ×3 IMPLANT
GLOVE BIOGEL PI IND STRL 8.5 (GLOVE) ×1 IMPLANT
GLOVE BIOGEL PI INDICATOR 8.5 (GLOVE) ×2
GLOVE SURG ORTHO 8.0 STRL STRW (GLOVE) ×3 IMPLANT
GOWN STRL REUS W/ TWL LRG LVL3 (GOWN DISPOSABLE) ×2 IMPLANT
GOWN STRL REUS W/ TWL XL LVL3 (GOWN DISPOSABLE) ×1 IMPLANT
GOWN STRL REUS W/TWL LRG LVL3 (GOWN DISPOSABLE) ×4
GOWN STRL REUS W/TWL XL LVL3 (GOWN DISPOSABLE) ×2
KIT BASIN OR (CUSTOM PROCEDURE TRAY) ×3 IMPLANT
KIT ROOM TURNOVER OR (KITS) ×3 IMPLANT
LOOP VESSEL MAXI BLUE (MISCELLANEOUS) IMPLANT
NEEDLE HYPO 25GX1X1/2 BEV (NEEDLE) IMPLANT
NS IRRIG 1000ML POUR BTL (IV SOLUTION) ×3 IMPLANT
PACK ORTHO EXTREMITY (CUSTOM PROCEDURE TRAY) ×3 IMPLANT
PAD ARMBOARD 7.5X6 YLW CONV (MISCELLANEOUS) ×6 IMPLANT
PAD CAST 4YDX4 CTTN HI CHSV (CAST SUPPLIES) ×2 IMPLANT
PADDING CAST COTTON 4X4 STRL (CAST SUPPLIES) ×4
PADDING CAST SYNTHETIC 3 NS LF (CAST SUPPLIES) ×2
PADDING CAST SYNTHETIC 3X4 NS (CAST SUPPLIES) ×1 IMPLANT
SOAP 2 % CHG 4 OZ (WOUND CARE) ×3 IMPLANT
SUCTION FRAZIER HANDLE 10FR (MISCELLANEOUS)
SUCTION TUBE FRAZIER 10FR DISP (MISCELLANEOUS) IMPLANT
SUT PROLENE 4 0 PS 2 18 (SUTURE) IMPLANT
SUT VIC AB 2-0 CT1 27 (SUTURE)
SUT VIC AB 2-0 CT1 TAPERPNT 27 (SUTURE) IMPLANT
SUT VIC AB 3-0 FS2 27 (SUTURE) IMPLANT
SYR CONTROL 10ML LL (SYRINGE) IMPLANT
SYSTEM CHEST DRAIN TLS 7FR (DRAIN) IMPLANT
TOWEL OR 17X24 6PK STRL BLUE (TOWEL DISPOSABLE) ×3 IMPLANT
TOWEL OR 17X26 10 PK STRL BLUE (TOWEL DISPOSABLE) ×3 IMPLANT
TUBE CONNECTING 12'X1/4 (SUCTIONS)
TUBE CONNECTING 12X1/4 (SUCTIONS) IMPLANT
UNDERPAD 30X30 (UNDERPADS AND DIAPERS) ×3 IMPLANT
WATER STERILE IRR 1000ML POUR (IV SOLUTION) ×3 IMPLANT

## 2016-10-24 NOTE — Op Note (Signed)
PREOPERATIVE DIAGNOSIS: Left hand carpal tunnel syndrome  POSTOPERATIVE DIAGNOSIS: Same  ATTENDING SURGEON: Dr. Gavin Pound who was scrubbed and present for the entire procedure  ASSISTANT SURGEON: None  ANESTHESIA: Local with IV sedation  OPERATIVE PROCEDURE: Left hand carpal tunnel release  IMPLANTS: None  RADIOGRAPHIC INTERPRETATION: None  SURGICAL INDICATIONS: Leslie Morris is a right-hand-dominant female with signs and symptoms consistent with left hand carpal tunnel syndrome. Patient elected undergo the above procedure. Risks benefits and alternatives were discussed in detail the patient in a signed informed consent was obtained. Risks include but not limited to bleeding infection damage to nearby nerves arteries or tendons loss of motion of the wrists and digits incomplete relief of symptoms and need for this further surgical intervention  SURGICAL TECHNIQUE: Patient is properly identified in the preoperative holding area and a mark with a permanent marker made on the left hand indicate correct operative site. Patient brought back to operating room placed supine on anesthesia and table where the IV sedation was administered a well-padded tourniquet placed on the left forearm and sealed with the appropriate drape. The left upper extremities and prepped and draped in normal sterile fashion after local anesthetic had been administered. The limb was then elevated tourniquet insufflated. A longitudinal incision made in the mid palm. Dissection carried down through the skin subtendinous tissue. The palmar fascia was incised longitudinally exposing the distal transverse carpal ligament. Under direct visualization the distal one half the transverse carpal ligament released. Further exposure was then carried out proximally with the patient did have the anatomical abnormality or variant. The patient did have the transligamentous branch of the recurrent branch of the median nerve proximally. This is skin  carefully dissected out preserving the recurrent branch the median nerve. Further exposure was then carried out along the ulnar leaflet of the transverse carpal ligament releasing remaining portion a transverse carpal ligament and further exposure carried out proximally releasing portion the antebrachial fascia. The wound was then thoroughly irrigated. The contents the carpal canal were then inspected and no other abnormalities are noted. The wound was irrigated and the skin was then closed using simple horizontal mattress Prolene suture. Xeroform dressing sterile compressive bandage then applied. Patient tolerated the procedure well returned recovery room in good condition.  POSTOPERATIVE PLAN: Patient be discharged home seen back now office in approximately 2 weeks for wound check suture removal general flex glove continue the postoperative carpal tunnel protocol.

## 2016-10-24 NOTE — Discharge Instructions (Signed)
KEEP [facility]ANDAGE [facility] AND [facility]Y CALL OFFICE FOR F/U APPT [phone] DR [facility] CELL [phone] KEEP HAND ELEVATED ABOVE HEART OK TO APPLY ICE TO OPERATIVE AREA CONTACT OFFICE IF ANY WORSENING PAIN OR CONCERNS.

## 2016-10-24 NOTE — Transfer of Care (Signed)
Immediate Anesthesia Transfer of Care Note  Patient: Leslie Morris  Procedure(s) Performed: Procedure(s): CARPAL TUNNEL RELEASE (Left)  Patient Location: PACU  Anesthesia Type:MAC  Level of Consciousness: awake, alert , oriented and patient cooperative  Airway & Oxygen Therapy: Patient Spontanous Breathing and Patient connected to nasal cannula oxygen  Post-op Assessment: Report given to RN and Post -op Vital signs reviewed and stable  Post vital signs: Reviewed and stable  Last Vitals:  Vitals:   10/24/16 1047 10/24/16 1429  BP: (!) 152/79   Pulse: 73   Resp: 20   Temp: 36.7 C (!) 36.4 C  SpO2: 97%     Last Pain:  Vitals:   10/24/16 1047  TempSrc: Oral  PainSc: 6       Patients Stated Pain Goal: 4 (56/43/32 9518)  Complications: No apparent anesthesia complications

## 2016-10-24 NOTE — Anesthesia Postprocedure Evaluation (Signed)
Anesthesia Post Note  Patient: Leslie Morris  Procedure(s) Performed: Procedure(s) (LRB): CARPAL TUNNEL RELEASE (Left)     Patient location during evaluation: PACU Anesthesia Type: MAC Level of consciousness: awake and alert Pain management: pain level controlled Vital Signs Assessment: post-procedure vital signs reviewed and stable Respiratory status: spontaneous breathing, nonlabored ventilation, respiratory function stable and patient connected to nasal cannula oxygen Cardiovascular status: stable and blood pressure returned to baseline Postop Assessment: no apparent nausea or vomiting Anesthetic complications: no    Last Vitals:  Vitals:   10/24/16 1445 10/24/16 1500  BP: 102/60 113/64  Pulse: (!) 56 65  Resp: 18 (!) 22  Temp:  (!) 36 C  SpO2: 100% 99%    Last Pain:  Vitals:   10/24/16 1510  TempSrc:   PainSc: 0-No pain                 Veona Bittman P Alicya Bena

## 2016-10-24 NOTE — Anesthesia Procedure Notes (Signed)
Procedure Name: MAC Date/Time: 10/24/2016 1:45 PM Performed by: Mervyn Gay Pre-anesthesia Checklist: Patient identified, Patient being monitored, Timeout performed, Emergency Drugs available and Suction available Patient Re-evaluated:Patient Re-evaluated prior to induction Oxygen Delivery Method: Simple face mask and Nasal cannula Number of attempts: 1 Placement Confirmation: positive ETCO2 Dental Injury: Teeth and Oropharynx as per pre-operative assessment

## 2016-10-24 NOTE — Anesthesia Preprocedure Evaluation (Signed)
Anesthesia Evaluation  Patient identified by MRN, date of birth, ID band Patient awake    Reviewed: Allergy & Precautions, NPO status , Patient's Chart, lab work & pertinent test results  History of Anesthesia Complications (+) PONV, Family history of anesthesia reaction and history of anesthetic complications  Airway Mallampati: II  TM Distance: >3 FB Neck ROM: Full    Dental no notable dental hx. (+) Dental Advisory Given   Pulmonary former smoker,    Pulmonary exam normal        Cardiovascular negative cardio ROS Normal cardiovascular exam     Neuro/Psych Anxiety negative neurological ROS     GI/Hepatic negative GI ROS, Neg liver ROS,   Endo/Other  diabetesMorbid obesity  Renal/GU negative Renal ROS     Musculoskeletal   Abdominal   Peds  Hematology   Anesthesia Other Findings   Reproductive/Obstetrics                             Anesthesia Physical  Anesthesia Plan  ASA: III  Anesthesia Plan: MAC   Post-op Pain Management:    Induction:   PONV Risk Score and Plan: 3 and Ondansetron, Dexamethasone, Midazolam and Treatment may vary due to age or medical condition  Airway Management Planned: Natural Airway, Simple Face Mask and Nasal Cannula  Additional Equipment:   Intra-op Plan:   Post-operative Plan:   Informed Consent: I have reviewed the patients History and Physical, chart, labs and discussed the procedure including the risks, benefits and alternatives for the proposed anesthesia with the patient or authorized representative who has indicated his/her understanding and acceptance.   Dental advisory given  Plan Discussed with: CRNA, Anesthesiologist and Surgeon  Anesthesia Plan Comments:         Anesthesia Quick Evaluation

## 2016-10-25 ENCOUNTER — Encounter (HOSPITAL_COMMUNITY): Payer: Self-pay | Admitting: Orthopedic Surgery

## 2016-12-11 ENCOUNTER — Encounter: Payer: Self-pay | Admitting: Physician Assistant

## 2016-12-11 ENCOUNTER — Ambulatory Visit: Payer: Self-pay | Admitting: Physician Assistant

## 2016-12-11 VITALS — BP 120/70 | HR 70 | Temp 98.5°F | Resp 16

## 2016-12-11 DIAGNOSIS — E559 Vitamin D deficiency, unspecified: Secondary | ICD-10-CM

## 2016-12-11 DIAGNOSIS — J0100 Acute maxillary sinusitis, unspecified: Secondary | ICD-10-CM

## 2016-12-11 DIAGNOSIS — N393 Stress incontinence (female) (male): Secondary | ICD-10-CM

## 2016-12-11 MED ORDER — FLUCONAZOLE 150 MG PO TABS
ORAL_TABLET | ORAL | 0 refills | Status: DC
Start: 2016-12-11 — End: 2017-01-31

## 2016-12-11 MED ORDER — METFORMIN HCL 500 MG PO TABS: 500.0000 mg | ORAL_TABLET | Freq: Two times a day (BID) | ORAL | 12 refills | Status: DC

## 2016-12-11 MED ORDER — OXYBUTYNIN CHLORIDE ER 15 MG PO TB24: 15.0000 mg | ORAL_TABLET | Freq: Every day | ORAL | 12 refills | Status: DC

## 2016-12-11 MED ORDER — AMOXICILLIN 875 MG PO TABS: 875.0000 mg | ORAL_TABLET | Freq: Two times a day (BID) | ORAL | 0 refills | Status: DC

## 2016-12-11 MED ORDER — VITAMIN D (ERGOCALCIFEROL) 1.25 MG (50000 UNIT) PO CAPS: 50000.0000 [IU] | ORAL_CAPSULE | ORAL | 3 refills | Status: DC

## 2016-12-11 NOTE — Progress Notes (Signed)
S: C/o headache and sinus congestion for 3 days, no fever, chills, cp/sob, v/d; mucus is green and thick, cough is sporadic, c/o of facial and dental pain.   Using otc meds:   O: PE: vitals wnl, nad,  perrl eomi, normocephalic, tms dull, nasal mucosa red and swollen, throat injected, neck supple no lymph, lungs c t a, cv rrr, neuro intact  A:  Acute sinusitis   P: drink fluids, continue regular meds , use otc meds of choice, return if not improving in 5 days, return earlier if worsening , amoxil , diflucan, 1 year refill on reg meds

## 2017-01-31 ENCOUNTER — Ambulatory Visit: Payer: Self-pay | Admitting: Nurse Practitioner

## 2017-01-31 ENCOUNTER — Ambulatory Visit: Payer: Self-pay

## 2017-01-31 VITALS — BP 125/79 | HR 66 | Temp 98.5°F | Resp 16

## 2017-01-31 DIAGNOSIS — R350 Frequency of micturition: Secondary | ICD-10-CM

## 2017-01-31 DIAGNOSIS — R399 Unspecified symptoms and signs involving the genitourinary system: Secondary | ICD-10-CM

## 2017-01-31 LAB — POCT URINALYSIS DIPSTICK
BILIRUBIN UA: NEGATIVE
Blood, UA: NEGATIVE
GLUCOSE UA: NEGATIVE
KETONES UA: NEGATIVE
Leukocytes, UA: NEGATIVE
Nitrite, UA: POSITIVE
PH UA: 7 (ref 5.0–8.0)
Protein, UA: NEGATIVE
Spec Grav, UA: 1.02 (ref 1.010–1.025)
Urobilinogen, UA: 0.2 E.U./dL

## 2017-01-31 MED ORDER — PHENAZOPYRIDINE HCL 100 MG PO TABS: 100.0000 mg | ORAL_TABLET | Freq: Three times a day (TID) | ORAL | 0 refills | Status: AC | PRN

## 2017-01-31 MED ORDER — NITROFURANTOIN MONOHYD MACRO 100 MG PO CAPS: 100.0000 mg | ORAL_CAPSULE | Freq: Two times a day (BID) | ORAL | 0 refills | Status: AC

## 2017-01-31 NOTE — Progress Notes (Signed)
Subjective:    Leslie Morris is a 46 y.o. female who complains of burning with urination, frequency, hesitancy, incomplete bladder emptying and nausea. She has had symptoms for 2 weeks. Patient denies back pain, fever and stomach ache. Patient does have a history of recurrent UTI. Patient does not have a history of pyelonephritis.   The following portions of the patient's history were reviewed and updated as appropriate: allergies, current medications and past medical history.  Review of Systems Constitutional: negative Ears, nose, mouth, throat, and face: negative Respiratory: negative Cardiovascular: negative Gastrointestinal: positive for nausea, negative for abdominal pain and vomiting Genitourinary:positive for dysuria, frequency and hesitancy, negative for decreased stream and urinary incontinence Behavioral/Psych: negative    Objective:    BP 125/79   Pulse 66   Temp 98.5 F (36.9 C) (Oral)   Resp 16   SpO2 100%  General appearance: alert, cooperative and no distress Head: Normocephalic, without obvious abnormality, atraumatic Lungs: clear to auscultation bilaterally Heart: regular rate and rhythm, S1, S2 normal, no murmur, click, rub or gallop Abdomen: soft, non-tender; bowel sounds normal; no masses,  no organomegaly  Laboratory:  Urine dipstick: sp gravity 1.020, negative for glucose, negative for hemoglobin, negative for ketones, negative for leukocyte esterase, positive for nitrites, negative for protein and 0.2 for urobilinogen.   Micro exam: not done.    Assessment:    Acute cystitis     Plan:    Medications: nitrofurantoin. Maintain adequate hydration. Follow up if symptoms not improving, and as needed. Pyridium for dysuria.  Discussed toileting schedule, use of cotton underwear and cranberry juice.  Will send urine for culture.

## 2017-01-31 NOTE — Addendum Note (Signed)
Addended by: Rudene Anda T on: 01/31/2017 03:48 PM   Modules accepted: Orders

## 2017-02-03 ENCOUNTER — Telehealth: Payer: Self-pay | Admitting: Nurse Practitioner

## 2017-02-03 LAB — URINE CULTURE

## 2017-02-03 NOTE — Telephone Encounter (Signed)
Left message for patient regarding her urine cx results.  Patient instructed to continue Nitrofurantoin based on her culture results.

## 2017-02-07 ENCOUNTER — Ambulatory Visit: Payer: Self-pay | Admitting: Nurse Practitioner

## 2017-02-07 DIAGNOSIS — N390 Urinary tract infection, site not specified: Secondary | ICD-10-CM

## 2017-02-07 LAB — POCT URINALYSIS DIPSTICK
Bilirubin, UA: NEGATIVE
LEUKOCYTES UA: NEGATIVE
NITRITE UA: POSITIVE
PH UA: 6 (ref 5.0–8.0)
SPEC GRAV UA: 1.02 (ref 1.010–1.025)
UROBILINOGEN UA: 1 U/dL

## 2017-02-07 MED ORDER — LEVOFLOXACIN 250 MG PO TABS: 250.0000 mg | ORAL_TABLET | Freq: Every day | ORAL | 0 refills | Status: DC

## 2017-02-07 MED ORDER — FLUCONAZOLE 150 MG PO TABS: 150.0000 mg | ORAL_TABLET | Freq: Once | ORAL | 2 refills | Status: AC

## 2017-02-07 MED ORDER — PHENAZOPYRIDINE HCL 100 MG PO TABS: 100.0000 mg | ORAL_TABLET | Freq: Three times a day (TID) | ORAL | 0 refills | Status: AC | PRN

## 2017-02-07 NOTE — Progress Notes (Addendum)
Subjective:    Leslie Morris is a 46 y.o. female who complains of burning with urination, hesitancy, nocturia, suprapubic pressure and urgency for 2 days.  Patient also complains of stomach ache. Patient denies back pain, fever and vaginal discharge , fever or chills. Patient was treated for Acute UTI on 12/21 and completed her abx approximately 2 days ago.  Patient states she noticed the symptoms around the same time.  Patient denies recurrent UTI, but states she did have them frequently when she was much younger.  Her last UTI before being dxed on 12/21 was approximately 15 years ago.   Patient does not have a history of pyelonephritis.  Patient has been taking Azo for her symptoms with mild relief.  The following portions of the patient's history were reviewed and updated as appropriate: allergies, current medications and past medical history.  Review of Systems Constitutional: negative Ears, nose, mouth, throat, and face: negative Respiratory: negative Cardiovascular: negative Gastrointestinal: positive for abdominal pain Genitourinary:positive for dysuria, frequency and nocturia, negative for urinary incontinence and vaginal discharge Behavioral/Psych: negative    Objective:    There were no vitals taken for this visit. General: alert, cooperative and no distress  Abdomen: soft, non-tender, without masses or organomegaly in the entire abdomen  Back: CVA tenderness absent  GU: deferred   Laboratory:  Urine dipstick shows sp gravity 1.020, trace for glucose, 1+ for hemoglobin, trace for ketones, negative for leukocyte esterase, positive for nitrites, 1+ for protein and 1.0 for urobilinogen.   Micro exam: not done.    Assessment:    Acute cystitis    Plan: Plan:    1. Medications: Levaquin, Pyridium and Diflucan for yeast prophylaxis. 2. Maintain adequate hydration 3. Follow up if symptoms not improving, and prn.   4. Patient informed regarding next steps for recurrent UTI.   Patient instructed to void after sex, increase fluids.  Patient verbalized understanding. Work note provided for patient for today.

## 2017-02-07 NOTE — Addendum Note (Signed)
Addended by: Rudene Anda T on: 02/07/2017 12:53 PM   Modules accepted: Orders

## 2017-02-09 LAB — URINE CULTURE

## 2017-02-10 ENCOUNTER — Ambulatory Visit: Payer: Self-pay | Admitting: Emergency Medicine

## 2017-02-10 VITALS — BP 120/80 | HR 84 | Temp 98.5°F | Resp 16

## 2017-02-10 DIAGNOSIS — L52 Erythema nodosum: Secondary | ICD-10-CM

## 2017-02-10 MED ORDER — PREDNISONE 10 MG (21) PO TBPK: ORAL_TABLET | ORAL | 0 refills | Status: DC

## 2017-02-10 NOTE — Progress Notes (Signed)
Subjective  Patient was in her usual state of health until Friday when she had symptoms of a urinary tract infection. She had recently completed a course of Macrodantin and had improved but symptoms recurred last Friday. Culture was done and she was started on Levaquin. That evening she developed some itching of her palms and soles. She subsequently developed flulike symptoms with aching in her joints. She slowly has developed red nodular lesions over the dorsum of her hands as well as her pretibial area. She also noticed increasing redness of her scars left palm and left wrist. Saturday night she had some breathing issues but these have since resolved. She has tried Benadryl with no improvement.  Objective  They're tender firm nodular areas over her hands and pretibial areas. HEENT exam reveals some flushing of the cheeks. Her throat is normal without redness. Neck is supple. Chest is clear to auscultation. Heart regular rate and rhythm.   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 This appears to be triggered by her Levaquin. Blood sugar checked was 152 Will check a strep tes Levaquin most likely was the trigger.Urine culture was negative..  Plan  We'll treat with Zyrtec 10 one a day. Benadryl 25 mg 1 to 2 at night. Avoid Levaquin. Burnis Medin hold off with her ibuprofen because she has a history of medullary sponge kidney. We'll treat with short dose of prednisone.

## 2017-02-12 ENCOUNTER — Ambulatory Visit: Payer: Self-pay | Admitting: Emergency Medicine

## 2017-02-12 VITALS — BP 119/80 | HR 69 | Temp 97.3°F | Resp 16

## 2017-02-12 DIAGNOSIS — L52 Erythema nodosum: Secondary | ICD-10-CM

## 2017-02-12 NOTE — Progress Notes (Signed)
Subjective. Patient here to follow-up erythema nodosum reaction to Levaquin. Since she started on the prednisone she has had dramatic improvement in the swelling and tender nodules she had developed. These lesions were present over the dorsum of both hands forearms and also over both shins and ankles. She has been on Zyrtec 10 mg in the morning Benadryl 25 mg. at night and prednisone on taper. She has only had 2 doses of prednisone. She does feel somewhat sleepy. She has been nauseated but no vomiting no blood in her stool. She was nauseated prior to starting the prednisone. She was not given nonsteroidals because of her history of medullary sponge kidney. Objective. Skin exam reveals resolving he nodosum involving the dorsum of her hands and both forearms. The lesions on her lower extremities are also resolving. There is a scar present over her left ankle that has had an area of bleeding but no surrounding redness or tenderness. Chest clear to auscultation and percussion. Heart regular rate no murmurs or gallops. Assessment. Patient has responded to steroids and antihistamines. Plan. She will take prednisone 30 mg today and then 10 mg tomorrow and then stop. Continue Benadryl 20 5 at night for 1 week. Continue Zyrtec 10 mg a day. Recheck if all symptoms have not essentially resolved in one week. She will be placed out of work until Monday.

## 2017-03-07 DIAGNOSIS — G5602 Carpal tunnel syndrome, left upper limb: Secondary | ICD-10-CM | POA: Insufficient documentation

## 2017-05-19 ENCOUNTER — Ambulatory Visit: Payer: Self-pay | Admitting: Family Medicine

## 2017-05-19 VITALS — BP 135/92 | HR 69 | Temp 98.1°F | Resp 16 | Ht 68.0 in | Wt 327.0 lb

## 2017-05-19 DIAGNOSIS — Z0189 Encounter for other specified special examinations: Principal | ICD-10-CM

## 2017-05-19 DIAGNOSIS — Z008 Encounter for other general examination: Secondary | ICD-10-CM

## 2017-05-19 LAB — GLUCOSE, POCT (MANUAL RESULT ENTRY): POC Glucose: 169 mg/dl — AB (ref 70–99)

## 2017-05-19 NOTE — Progress Notes (Signed)
169 
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
## 2017-05-20 LAB — LIPID PANEL
Chol/HDL Ratio: 4.5 ratio — ABNORMAL HIGH (ref 0.0–4.4)
Cholesterol, Total: 225 mg/dL — ABNORMAL HIGH (ref 100–199)
HDL: 50 mg/dL (ref >39)
LDL Calculated: 123 mg/dL — ABNORMAL HIGH (ref 0–99)
Triglycerides: 258 mg/dL — ABNORMAL HIGH (ref 0–149)
VLDL CHOLESTEROL CAL: 52 mg/dL — AB (ref 5–40)

## 2017-05-20 NOTE — Progress Notes (Signed)
Dear Leslie Morris, I wanted to let you know that your lipid panel came back.  Your total cholesterol is 225, normal values are between 100 and 199.  Your triglyceride level is 258, normal values are between 0 and 149.  Your VLDL cholesterol is 52, normal values are between 5 and 40. Your HDL cholesterol ("good cholesterol") is 50, normal values are above 39.  Your LDL cholesterol ("bad cholesterol") is 123, normal values are below 99.  Your cholesterol/HDL ratio is 4.5, normal values are between 0 and 4.4 for women or 0 and 5 from men.  These abnormal values increase your risk for cardiovascular disease.  I wanted you to be aware of these results so that you can discuss this with your primary care provider at your first visit.

## 2017-08-25 ENCOUNTER — Encounter: Payer: Self-pay | Admitting: Family Medicine

## 2017-08-25 ENCOUNTER — Ambulatory Visit: Payer: Managed Care, Other (non HMO) | Admitting: Family Medicine

## 2017-08-25 VITALS — BP 118/90 | HR 77 | Temp 98.1°F | Resp 16 | Wt 375.2 lb

## 2017-08-25 DIAGNOSIS — H6982 Other specified disorders of Eustachian tube, left ear: Secondary | ICD-10-CM | POA: Diagnosis not present

## 2017-08-25 MED ORDER — FLUTICASONE PROPIONATE 50 MCG/ACT NA SUSP: 2.0000 | Freq: Every day | NASAL | 1 refills | Status: DC

## 2017-08-25 NOTE — Patient Instructions (Signed)
For knee pain take two Aleve with food twice daily with food. May also add Tylenol up to 3000 mg/ day. Let me know if ear not improving in a week.

## 2017-08-25 NOTE — Progress Notes (Signed)
  Subjective:     Patient ID: Leslie Morris, female   DOB: 11/22/1970, 47 y.o.   MRN: [id number] Chief Complaint  Patient presents with  . Ear Fullness    Patient comes in office today with concerns of her left ear sounding muffled for the past 3 days, patient reports difficulty popping ear.    HPI States she has a hx of allergies but no other sx at this time. No hx of wax impaction but does use Q-tips periodically. Accompanied by her daughter today. Discussed rational use of nsaid's and Tylenol for chronic right knee pain followed by orthopedics.  Review of Systems     Objective:   Physical Exam  Constitutional: She appears well-developed and well-nourished. No distress.  HENT:  Right Ear: External ear normal.  Left Ear: External ear normal.  Bilateral T.M.s without inflammation. No left tragal tenderness.       Assessment:    1. Eustachian tube dysfunction, left - fluticasone (FLONASE) 50 MCG/ACT nasal spray; Place 2 sprays into both nostrils daily.  Dispense: 16 g; Refill: 1    Plan:    Discussed use of Aleve with food and Tylenol to 3000 mg/day. Will call if ear not improving.

## 2017-09-16 ENCOUNTER — Other Ambulatory Visit: Payer: Self-pay | Admitting: Family Medicine

## 2017-09-16 DIAGNOSIS — H6982 Other specified disorders of Eustachian tube, left ear: Secondary | ICD-10-CM

## 2017-10-19 ENCOUNTER — Other Ambulatory Visit: Payer: Self-pay | Admitting: Family Medicine

## 2017-10-19 DIAGNOSIS — H6982 Other specified disorders of Eustachian tube, left ear: Secondary | ICD-10-CM

## 2017-12-04 ENCOUNTER — Encounter: Payer: Self-pay | Admitting: Family Medicine

## 2017-12-04 ENCOUNTER — Ambulatory Visit (INDEPENDENT_AMBULATORY_CARE_PROVIDER_SITE_OTHER): Payer: Managed Care, Other (non HMO) | Admitting: Family Medicine

## 2017-12-04 VITALS — BP 134/88 | HR 83 | Temp 98.0°F | Wt 375.6 lb

## 2017-12-04 DIAGNOSIS — Z87440 Personal history of urinary (tract) infections: Secondary | ICD-10-CM | POA: Diagnosis not present

## 2017-12-04 DIAGNOSIS — S39012A Strain of muscle, fascia and tendon of lower back, initial encounter: Secondary | ICD-10-CM | POA: Diagnosis not present

## 2017-12-04 DIAGNOSIS — E119 Type 2 diabetes mellitus without complications: Secondary | ICD-10-CM | POA: Diagnosis not present

## 2017-12-04 DIAGNOSIS — N393 Stress incontinence (female) (male): Secondary | ICD-10-CM | POA: Diagnosis not present

## 2017-12-04 MED ORDER — HYDROCODONE-ACETAMINOPHEN 5-325 MG PO TABS: 1.0000 | ORAL_TABLET | Freq: Four times a day (QID) | ORAL | 0 refills | Status: AC | PRN

## 2017-12-04 MED ORDER — KETOROLAC TROMETHAMINE 60 MG/2ML IM SOLN
60.0000 mg | Freq: Once | INTRAMUSCULAR | Status: AC
  Administered 2017-12-04: 60 mg via INTRAMUSCULAR

## 2017-12-04 MED ORDER — OXYBUTYNIN CHLORIDE ER 15 MG PO TB24: 15.0000 mg | ORAL_TABLET | Freq: Every day | ORAL | 12 refills | Status: DC

## 2017-12-04 MED ORDER — CYCLOBENZAPRINE HCL 5 MG PO TABS: 5.0000 mg | ORAL_TABLET | Freq: Three times a day (TID) | ORAL | 0 refills | Status: DC | PRN

## 2017-12-04 NOTE — Patient Instructions (Addendum)
Continue Aleve two pills twice daily with food. May take up to 3000 mg/day including the amount in the pain pill. Warm compresses for 20 minutes several x day. Do establish with Dr. Brita Romp.

## 2017-12-04 NOTE — Progress Notes (Signed)
  Subjective:     Patient ID: Leslie Morris, female   DOB: 11-11-1970, 47 y.o.   MRN: [id number] Chief Complaint  Patient presents with  . Back Pain    Patient presents today with left lower back pain. Patient states she could barely work or sit down due to back pain since Sunday, 11/30/2017. Patient states she has taking Aleve and Tylenol together to reduce the pain.    HPI Reports incapacitating spasms and has not been able to return to work today.No specific injury/fall reported. No prior hx of back injury but has a chronic right knee meniscal tear. Also wishes refill on oxybutynin. Previously was using employee health for all her medical care but was told she needs to find a  PCP. She is accompanied by her daughter today.  Review of Systems     Objective:   Physical Exam  Constitutional: She appears well-developed and well-nourished. Distressed:  moderate pain especially when changing positions.  Musculoskeletal:  Muscle strength in lower extremities 5/5. SLR's to 80- 90 degrees without radiation of back pain. Tender in her right SI area.       Assessment:    1. Strain of lumbar region, initial encounter - cyclobenzaprine (FLEXERIL) 5 MG tablet; Take 1 tablet (5 mg total) by mouth 3 (three) times daily as needed for muscle spasms.  Dispense: 21 tablet; Refill: 0 - ketorolac (TORADOL) injection 60 mg - HYDROcodone-acetaminophen (NORCO/VICODIN) 5-325 MG tablet; Take 1 tablet by mouth every 6 (six) hours as needed for up to 5 days for moderate pain. One every 4-6 hours as needed for pain  Dispense: 20 tablet; Refill: 0  2. History of recurrent UTI (urinary tract infection) - oxybutynin (DITROPAN XL) 15 MG 24 hr tablet; Take 1 tablet (15 mg total) by mouth at bedtime.  Dispense: 30 tablet; Refill: 12  3. Stress incontinence - oxybutynin (DITROPAN XL) 15 MG 24 hr tablet; Take 1 tablet (15 mg total) by mouth at bedtime.  Dispense: 30 tablet; Refill: 12    Plan:    Discussed use of  Aleve and Tylenol up to 3000 mg/day. Cautioned against sedation with muscle relaxant and narcotic pain medication. May add warm compresses or 20 minutes several x day.   Work excuse 10/24-10/25.

## 2018-01-04 DIAGNOSIS — Z6841 Body Mass Index (BMI) 40.0 and over, adult: Secondary | ICD-10-CM | POA: Insufficient documentation

## 2018-01-25 NOTE — Discharge Instructions (Signed)
°  Instructions after Knee Arthroscopy  ° ° Jemarion Roycroft P. Maxamilian Amadon, Jr., M.D.    ° Dept. of Orthopaedics & Sports Medicine ° [facility] ° [address] ° [place], [place]  [address] ° ° Phone: [phone]   Fax: [phone] ° ° °DIET: °• Drink plenty of non-alcoholic fluids & begin a light diet. °• Resume your normal diet the day after surgery. ° °ACTIVITY:  °• You may use crutches or a walker with weight-bearing as tolerated, unless instructed otherwise. °• You may wean yourself off of the walker or crutches as tolerated.  °• Begin doing gentle exercises. Exercising will reduce the pain and swelling, increase motion, and prevent muscle weakness.   °• Avoid strenuous activities or athletics for a minimum of 4-6 weeks after arthroscopic surgery. °• Do not drive or operate any equipment until instructed. ° °WOUND CARE:  °• Place one to two pillows under the knee the first day or two when sitting or lying.  °• Continue to use the ice packs periodically to reduce pain and swelling. °• The small incisions in your knee are closed with nylon stitches. The stitches will be removed in the office. °• The bulky dressing may be removed on the second day after surgery. DO NOT TOUCH THE STITCHES. Put a Band-Aid over each stitch. Do NOT use any ointments or creams on the incisions.  °• You may bathe or shower after the stitches are removed at the first office visit following surgery. ° °MEDICATIONS: °• You may resume your regular medications. °• Please take the pain medication as prescribed. °• Do not take pain medication on an empty stomach. °• Do not drive or drink alcoholic beverages when taking pain medications. ° °CALL THE OFFICE FOR: °• Temperature above 101 degrees °• Excessive bleeding or drainage on the dressing. °• Excessive swelling, coldness, or paleness of the toes. °• Persistent nausea and vomiting. ° °FOLLOW-UP:  °• You should have an appointment to return to the office in 7-10 days after surgery.  °  °

## 2018-02-09 ENCOUNTER — Encounter: Payer: Self-pay | Admitting: Anesthesiology

## 2018-02-09 ENCOUNTER — Encounter
Admission: RE | Admit: 2018-02-09 | Discharge: 2018-02-09 | Disposition: A | Discharge status: Home / Self Care | Payer: Managed Care, Other (non HMO) | Source: Ambulatory Visit | Attending: Orthopedic Surgery | Admitting: Orthopedic Surgery

## 2018-02-09 ENCOUNTER — Other Ambulatory Visit: Payer: Self-pay

## 2018-02-09 DIAGNOSIS — Z01818 Encounter for other preprocedural examination: Secondary | ICD-10-CM | POA: Diagnosis not present

## 2018-02-09 LAB — BASIC METABOLIC PANEL
Anion gap: 13 (ref 5–15)
BUN: 14 mg/dL (ref 6–20)
CO2: 22 mmol/L (ref 22–32)
Calcium: 9.1 mg/dL (ref 8.9–10.3)
Chloride: 99 mmol/L (ref 98–111)
Creatinine, Ser: 0.6 mg/dL (ref 0.44–1.00)
GFR calc Af Amer: >60 mL/min (ref >60)
GFR calc non Af Amer: >60 mL/min (ref >60)
Glucose, Bld: 346 mg/dL — ABNORMAL HIGH (ref 70–99)
Potassium: 4.1 mmol/L (ref 3.5–5.1)
Sodium: 134 mmol/L — ABNORMAL LOW (ref 135–145)

## 2018-02-09 LAB — HEMOGLOBIN A1C
Hgb A1c MFr Bld: 10.4 % — ABNORMAL HIGH (ref 4.8–5.6)
Mean Plasma Glucose: 251.78 mg/dL

## 2018-02-09 LAB — CBC
HCT: 42.2 % (ref 36.0–46.0)
Hemoglobin: 13.5 g/dL (ref 12.0–15.0)
MCH: 27.6 pg (ref 26.0–34.0)
MCHC: 32 g/dL (ref 30.0–36.0)
MCV: 86.1 fL (ref 80.0–100.0)
Platelets: 243 10*3/uL (ref 150–400)
RBC: 4.9 MIL/uL (ref 3.87–5.11)
RDW: 14 % (ref 11.5–15.5)
WBC: 8.6 10*3/uL (ref 4.0–10.5)
nRBC: 0 % (ref 0.0–0.2)

## 2018-02-09 NOTE — Pre-Procedure Instructions (Signed)
GLU 346 FAXED TO DR HOOTEN TO ADD AS PART OF CLEARANCE OPTIMIZATION. LM FOR TIFFANY

## 2018-02-09 NOTE — Pre-Procedure Instructions (Signed)
Abnormal hemoglobin A1C faxed to Dr Clydell Hakim office

## 2018-02-09 NOTE — Pre-Procedure Instructions (Signed)
MESSAGED ANESTHESIA RE EKG

## 2018-02-09 NOTE — Pre-Procedure Instructions (Signed)
EKG/REQUEST FOR CLEARANCE FAXED TO DR HOOTEN. MESSAGED DR Marry Guan

## 2018-02-09 NOTE — Patient Instructions (Signed)
Your procedure is scheduled on: February 18, 2018  Wednesday  Report to Day Surgery on the 2nd floor of the [facility]. To find out your arrival time, please call [phone] between 1PM - 3PM on: Tuesday February 17, 2018  REMEMBER: Instructions that are not followed completely may result in serious medical risk, up to and including death; or upon the discretion of your surgeon and anesthesiologist your surgery may need to be rescheduled.  Do not eat food after midnight the night before surgery.  No gum chewing, lozengers or hard candies.  You may however, drink CLEAR liquids up to 2 hours before you are scheduled to arrive for your surgery. Do not drink anything within 2 hours of the start of your surgery.  Clear liquids include: - water   Do NOT drink anything that is not on this list.  Type 1 and Type 2 diabetics should only drink water.  ENSURE PRE-SURGERY CARBOHYDRATE DRINK:  Complete drinking prior to leaving for the hospital for bariatric cases.  Follow Dr. Dwyane Luo order for the drink, (he wants it 3 hours before arrival to the hospital.)  No Alcohol for 24 hours before or after surgery.  No Smoking including e-cigarettes for 24 hours prior to surgery.  No chewable tobacco products for at least 6 hours prior to surgery.  No nicotine patches on the day of surgery.  On the morning of surgery brush your teeth with toothpaste and water, you may rinse your mouth with mouthwash if you wish. Do not swallow any toothpaste or mouthwash.  Notify your doctor if there is any change in your medical condition (cold, fever, infection).  Do not wear jewelry, make-up, hairpins, clips or nail polish.  Do not wear lotions, powders, or perfumes.   Do not shave 48 hours prior to surgery.   Contacts and dentures may not be worn into surgery.  Do not bring valuables to the hospital, including drivers license, insurance or credit cards.  [facility] is not responsible for any belongings  or valuables.   TAKE THESE MEDICATIONS THE MORNING OF SURGERY: Pain pill if needed  Use CHG Soap  as directed on instruction sheet.  Stop Metformin  2 days prior to surgery. Last dose Last dose February 15, 2018.  Stop Anti-inflammatories (NSAIDS) such as Advil, Aleve, Ibuprofen, Motrin, Naproxen, Naprosyn and Aspirin based products such as Excedrin, Goodys Powder, BC Powder. Last dose February 10, 2018 Tuesday (May take Tylenol or Acetaminophen if needed.)   Stop ANY OVER THE COUNTER supplements until after surgery. (May continue Vitamin D, Vitamin B, and multivitamin.)  Wear comfortable clothing (specific to your surgery type) to the hospital.  Plan for stool softeners for home use.  If you are being discharged the day of surgery, you will not be allowed to drive home. You will need a responsible adult to drive you home and stay with you that night.   If you are taking public transportation, you will need to have a responsible adult with you. Please confirm with your physician that it is acceptable to use public transportation.   Please call [phone] if you have any questions about these instructions.

## 2018-02-12 ENCOUNTER — Ambulatory Visit (INDEPENDENT_AMBULATORY_CARE_PROVIDER_SITE_OTHER): Payer: Managed Care, Other (non HMO) | Admitting: Family Medicine

## 2018-02-12 ENCOUNTER — Encounter: Payer: Self-pay | Admitting: Family Medicine

## 2018-02-12 ENCOUNTER — Other Ambulatory Visit: Payer: Self-pay

## 2018-02-12 VITALS — BP 130/94 | HR 77 | Temp 97.6°F | Ht 67.0 in | Wt 354.0 lb

## 2018-02-12 DIAGNOSIS — J069 Acute upper respiratory infection, unspecified: Secondary | ICD-10-CM

## 2018-02-12 DIAGNOSIS — E119 Type 2 diabetes mellitus without complications: Secondary | ICD-10-CM

## 2018-02-12 MED ORDER — METFORMIN HCL 1000 MG PO TABS: 1000.0000 mg | ORAL_TABLET | Freq: Two times a day (BID) | ORAL | 0 refills | Status: DC

## 2018-02-12 NOTE — Patient Instructions (Signed)
Discussed use of saline Nasal spray, Clartin or similar during the day, and Benadryl at night for post nasal drainage. Add Delsym if you develop a cough.

## 2018-02-12 NOTE — Progress Notes (Addendum)
  Subjective:     Patient ID: SHANAIYA BENE, female   DOB: 11/16/1970, 48 y.o.   MRN: [id number] Chief Complaint  Patient presents with  . discuss pre op results  . itchy scratchy throat    started tuesday 02/10/18, runny nose  . Headache   HPI She was pending right arthroscopic surgery when preop evaluation revealed diabetes out of control with A1C of 10.4 , elevated blood pressure and abnormal EKG with LVH and ? Of old septal infarct. Denies hx of CAD or associated symptoms. Reports poor compliance with metformin. States she has been under increased stress due to the death of a friend over the holidays and the unannounced marriage of her 84 year old daughter.  Review of Systems     Objective:   Physical Exam Constitutional:      General: She is not in acute distress.    Appearance: She is not ill-appearing.  Neurological:     Mental Status: She is alert.   Ears: T.M's intact without inflammation Throat: tonsils absent Neck: no cervical adenopathy Lungs: clear     Assessment:    1. Diabetes mellitus without complication ([place]): increase metformin 1000 mg with meals twice daily  2. URI, acute    Plan:    Discussed use of saline, antihistamines, and Delsym. Reassess diabetes and bp in two weeks. Will repeat EKG when diabetes under better control. May need cardiology clearance before surgery.

## 2018-02-18 ENCOUNTER — Encounter: Admission: RE | Payer: Self-pay | Source: Home / Self Care

## 2018-02-18 ENCOUNTER — Ambulatory Visit
Admission: RE | Admit: 2018-02-18 | Payer: Managed Care, Other (non HMO) | Source: Home / Self Care | Admitting: Orthopedic Surgery

## 2018-02-18 SURGERY — ARTHROSCOPY, KNEE
Anesthesia: Choice | Laterality: Right

## 2018-02-27 ENCOUNTER — Other Ambulatory Visit: Payer: Self-pay

## 2018-02-27 ENCOUNTER — Encounter: Payer: Self-pay | Admitting: Family Medicine

## 2018-02-27 ENCOUNTER — Ambulatory Visit (INDEPENDENT_AMBULATORY_CARE_PROVIDER_SITE_OTHER): Payer: Managed Care, Other (non HMO) | Admitting: Family Medicine

## 2018-02-27 VITALS — BP 142/90 | HR 80 | Temp 97.8°F | Ht 67.0 in | Wt 359.6 lb

## 2018-02-27 DIAGNOSIS — E119 Type 2 diabetes mellitus without complications: Secondary | ICD-10-CM

## 2018-02-27 LAB — GLUCOSE, POCT (MANUAL RESULT ENTRY): POC Glucose: 152 mg/dl — AB (ref 70–99)

## 2018-02-27 MED ORDER — EMPAGLIFLOZIN 10 MG PO TABS: 10.0000 mg | ORAL_TABLET | Freq: Every day | ORAL | 1 refills | Status: DC

## 2018-02-27 NOTE — Progress Notes (Signed)
  Subjective:     Patient ID: Leslie Morris, female   DOB: 03-16-1970, 48 y.o.   MRN: [id number] Chief Complaint  Patient presents with  . Follow-up    2 week diabetes and BP   HPI States she has changed her eating habits and is tolerating the higher dose of metformin. Has recorded fasting blood sugars in the low 200's.Accompanied by her mother today.  Review of Systems     Objective:   Physical Exam Constitutional:      General: She is not in acute distress. Neurological:     Mental Status: She is alert.        Assessment:    1. Diabetes mellitus without complication ([place]); rx for Jardiance - POCT glucose (manual entry)    Plan:    F/u in two weeks. Have discussed side effects and benefits of this new medication and to keep up with her fluid intake. Will repeat EKG at next o.v. and see if we can get her ready for knee surgery.

## 2018-02-27 NOTE — Patient Instructions (Signed)
Let me know before your next office visit if you are tolerating the medication.

## 2018-03-06 ENCOUNTER — Other Ambulatory Visit: Payer: Self-pay | Admitting: Family Medicine

## 2018-03-13 ENCOUNTER — Ambulatory Visit (INDEPENDENT_AMBULATORY_CARE_PROVIDER_SITE_OTHER): Payer: Managed Care, Other (non HMO) | Admitting: Family Medicine

## 2018-03-13 ENCOUNTER — Encounter: Payer: Self-pay | Admitting: Family Medicine

## 2018-03-13 ENCOUNTER — Other Ambulatory Visit: Payer: Self-pay

## 2018-03-13 VITALS — BP 128/80 | HR 82 | Temp 98.0°F | Ht 67.0 in | Wt 350.8 lb

## 2018-03-13 DIAGNOSIS — E119 Type 2 diabetes mellitus without complications: Secondary | ICD-10-CM

## 2018-03-13 NOTE — Progress Notes (Signed)
  Subjective:     Patient ID: STEPHINIE BATTISTI, female   DOB: 1971/01/23, 48 y.o.   MRN: [id number] Chief Complaint  Patient presents with  . Diabetes    2 week fup.  fasting bs ranges 150 to 170 , lunch 104 to 111, night 120's or lower  . Hypertension    2 week fup   HPI States she feels much better on Jardiance: weight loss, sleeping better, and lowered blood pressure. Continues on metformin as well.  Review of Systems  Gastrointestinal:       Heartburn       Objective:   Physical Exam Constitutional:      General: She is not in acute distress. Cardiovascular:     Rate and Rhythm: Normal rate and regular rhythm.  Pulmonary:     Effort: Pulmonary effort is normal.     Breath sounds: Normal breath sounds.  Musculoskeletal:     Right lower leg: No edema.     Left lower leg: No edema.  Neurological:     Mental Status: She is alert.        Assessment:    1. Diabetes mellitus without complication ([place]); improved control    Plan:    Reschedule her pre-op for arthroscopic knee surgery.May use Pepcid for heartburn. Establish with Dr. B. For ongoing f/u of diabetes.

## 2018-03-13 NOTE — Patient Instructions (Addendum)
Call your orthopedic doctor and let them know you sugar and blood pressure are improved. Do schedule a follow up in 3 months with Dr. Jacinto Reap. Call for refills on medication. May use Pepcid as needed for heartburn.

## 2018-04-07 ENCOUNTER — Other Ambulatory Visit: Payer: Self-pay | Admitting: Family Medicine

## 2018-04-14 ENCOUNTER — Encounter: Payer: Self-pay | Admitting: Adult Health

## 2018-04-14 ENCOUNTER — Ambulatory Visit: Payer: Managed Care, Other (non HMO) | Admitting: Adult Health

## 2018-04-14 VITALS — BP 110/84 | HR 76 | Resp 13 | Ht 67.0 in | Wt 345.0 lb

## 2018-04-14 DIAGNOSIS — Z760 Encounter for issue of repeat prescription: Secondary | ICD-10-CM

## 2018-04-14 DIAGNOSIS — Z0189 Encounter for other specified special examinations: Secondary | ICD-10-CM

## 2018-04-14 DIAGNOSIS — Z008 Encounter for other general examination: Secondary | ICD-10-CM

## 2018-04-14 DIAGNOSIS — H6501 Acute serous otitis media, right ear: Secondary | ICD-10-CM

## 2018-04-14 MED ORDER — GLUCOSE BLOOD VI STRP: ORAL_STRIP | 0 refills | Status: DC

## 2018-04-14 MED ORDER — ONETOUCH DELICA LANCETS 33G MISC: 1.0000 | Freq: Three times a day (TID) | 0 refills | Status: DC

## 2018-04-14 MED ORDER — FLUCONAZOLE 150 MG PO TABS: 150.0000 mg | ORAL_TABLET | Freq: Once | ORAL | 0 refills | Status: AC

## 2018-04-14 MED ORDER — AMOXICILLIN 875 MG PO TABS: 875.0000 mg | ORAL_TABLET | Freq: Two times a day (BID) | ORAL | 0 refills | Status: DC
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
## 2018-04-14 NOTE — Patient Instructions (Addendum)
Please see your primary care provider for a yearly physical and labs.  I will have the office call you on your glucose and cholesterol results when they return if you have not heard within 1 week please call the office and will have you follow up with your primary care doctor for any abnormal's. This biometric physical is not a substitute for seeing a primary care provider for a physical. Provider also recommends if you do not have a primary care provider for patient see a  primary care physician/ provider  for a routine physical and to establish primary care. Patient may chose provider of choice. Also gave the [place] at [phone] or web site at [email or web address] to help assist with finding a primary care doctor. Patient understands this office is acute care and no primary care is in this office.   Follow up with primary care as needed for chronic and maintenance health care- can be seen in this employee clinic for acute care.     Health Maintenance, Female Adopting a healthy lifestyle and getting preventive care can go a long way to promote health and wellness. Talk with your health care provider about what schedule of regular examinations is right for you. This is a good chance for you to check in with your provider about disease prevention and staying healthy. In between checkups, there are plenty of things you can do on your own. Experts have done a lot of research about which lifestyle changes and preventive measures are most likely to keep you healthy. Ask your health care provider for more information. Weight and diet Eat a healthy diet  Be sure to include plenty of vegetables, fruits, low-fat dairy products, and lean protein.  Do not eat a lot of foods high in solid fats, added sugars, or salt.  Get regular exercise. This is one of the most important things you can do for your health. ? Most adults should exercise for at least 150 minutes each  week. The exercise should increase your heart rate and make you sweat (moderate-intensity exercise). ? Most adults should also do strengthening exercises at least twice a week. This is in addition to the moderate-intensity exercise. Maintain a healthy weight  Body mass index (BMI) is a measurement that can be used to identify possible weight problems. It estimates body fat based on height and weight. Your health care provider can help determine your BMI and help you achieve or maintain a healthy weight.  For females 12 years of age and older: ? A BMI below 18.5 is considered underweight. ? A BMI of 18.5 to 24.9 is normal. ? A BMI of 25 to 29.9 is considered overweight. ? A BMI of 30 and above is considered obese. Watch levels of cholesterol and blood lipids  You should start having your blood tested for lipids and cholesterol at 46 years of age, then have this test every 5 years.  You may need to have your cholesterol levels checked more often if: ? Your lipid or cholesterol levels are high. ? You are older than 46 years of age. ? You are at high risk for heart disease. Cancer screening Lung Cancer  Lung cancer screening is recommended for adults 5-73 years old who are at high risk for lung cancer because of a history of smoking.  A yearly low-dose CT scan of the lungs is recommended for people who: ? Currently smoke. ? Have quit within the past 15 years. ?  Have at least a 30-pack-year history of smoking. A pack year is smoking an average of one pack of cigarettes a day for 1 year.  Yearly screening should continue until it has been 15 years since you quit.  Yearly screening should stop if you develop a health problem that would prevent you from having lung cancer treatment. Breast Cancer  Practice breast self-awareness. This means understanding how your breasts normally appear and feel.  It also means doing regular breast self-exams. Let your health care provider know about any  changes, no matter how small.  If you are in your 20s or 30s, you should have a clinical breast exam (CBE) by a health care provider every 1-3 years as part of a regular health exam.  If you are 66 or older, have a CBE every year. Also consider having a breast X-ray (mammogram) every year.  If you have a family history of breast cancer, talk to your health care provider about genetic screening.  If you are at high risk for breast cancer, talk to your health care provider about having an MRI and a mammogram every year.  Breast cancer gene (BRCA) assessment is recommended for women who have family members with BRCA-related cancers. BRCA-related cancers include: ? Breast. ? Ovarian. ? Tubal. ? Peritoneal cancers.  Results of the assessment will determine the need for genetic counseling and BRCA1 and BRCA2 testing. Cervical Cancer Your health care provider may recommend that you be screened regularly for cancer of the pelvic organs (ovaries, uterus, and vagina). This screening involves a pelvic examination, including checking for microscopic changes to the surface of your cervix (Pap test). You may be encouraged to have this screening done every 3 years, beginning at age 69.  For women ages 87-65, health care providers may recommend pelvic exams and Pap testing every 3 years, or they may recommend the Pap and pelvic exam, combined with testing for human papilloma virus (HPV), every 5 years. Some types of HPV increase your risk of cervical cancer. Testing for HPV may also be done on women of any age with unclear Pap test results.  Other health care providers may not recommend any screening for nonpregnant women who are considered low risk for pelvic cancer and who do not have symptoms. Ask your health care provider if a screening pelvic exam is right for you.  If you have had past treatment for cervical cancer or a condition that could lead to cancer, you need Pap tests and screening for cancer  for at least 20 years after your treatment. If Pap tests have been discontinued, your risk factors (such as having a new sexual partner) need to be reassessed to determine if screening should resume. Some women have medical problems that increase the chance of getting cervical cancer. In these cases, your health care provider may recommend more frequent screening and Pap tests. Colorectal Cancer  This type of cancer can be detected and often prevented.  Routine colorectal cancer screening usually begins at 46 years of age and continues through 46 years of age.  Your health care provider may recommend screening at an earlier age if you have risk factors for colon cancer.  Your health care provider may also recommend using home test kits to check for hidden blood in the stool.  A small camera at the end of a tube can be used to examine your colon directly (sigmoidoscopy or colonoscopy). This is done to check for the earliest forms of colorectal cancer.  Routine  screening usually begins at age 36.  Direct examination of the colon should be repeated every 5-10 years through 46 years of age. However, you may need to be screened more often if early forms of precancerous polyps or small growths are found. Skin Cancer  Check your skin from head to toe regularly.  Tell your health care provider about any new moles or changes in moles, especially if there is a change in a mole's shape or color.  Also tell your health care provider if you have a mole that is larger than the size of a pencil eraser.  Always use sunscreen. Apply sunscreen liberally and repeatedly throughout the day.  Protect yourself by wearing long sleeves, pants, a wide-brimmed hat, and sunglasses whenever you are outside. Heart disease, diabetes, and high blood pressure  High blood pressure causes heart disease and increases the risk of stroke. High blood pressure is more likely to develop in: ? People who have blood pressure in  the high end of the normal range (130-139/85-89 mm Hg). ? People who are overweight or obese. ? People who are African American.  If you are 24-64 years of age, have your blood pressure checked every 3-5 years. If you are 38 years of age or older, have your blood pressure checked every year. You should have your blood pressure measured twice-once when you are at a hospital or clinic, and once when you are not at a hospital or clinic. Record the average of the two measurements. To check your blood pressure when you are not at a hospital or clinic, you can use: ? An automated blood pressure machine at a pharmacy. ? A home blood pressure monitor.  If you are between 35 years and 72 years old, ask your health care provider if you should take aspirin to prevent strokes.  Have regular diabetes screenings. This involves taking a blood sample to check your fasting blood sugar level. ? If you are at a normal weight and have a low risk for diabetes, have this test once every three years after 46 years of age. ? If you are overweight and have a high risk for diabetes, consider being tested at a younger age or more often. Preventing infection Hepatitis B  If you have a higher risk for hepatitis B, you should be screened for this virus. You are considered at high risk for hepatitis B if: ? You were born in a country where hepatitis B is common. Ask your health care provider which countries are considered high risk. ? Your parents were born in a high-risk country, and you have not been immunized against hepatitis B (hepatitis B vaccine). ? You have HIV or AIDS. ? You use needles to inject street drugs. ? You live with someone who has hepatitis B. ? You have had sex with someone who has hepatitis B. ? You get hemodialysis treatment. ? You take certain medicines for conditions, including cancer, organ transplantation, and autoimmune conditions. Hepatitis C  Blood testing is recommended for: ? Everyone  born from [birth year] through 1963. ? Anyone with known risk factors for hepatitis C. Sexually transmitted infections (STIs)  You should be screened for sexually transmitted infections (STIs) including gonorrhea and chlamydia if: ? You are sexually active and are younger than 46 years of age. ? You are older than 46 years of age and your health care provider tells you that you are at risk for this type of infection. ? Your sexual activity has changed since you were  last screened and you are at an increased risk for chlamydia or gonorrhea. Ask your health care provider if you are at risk.  If you do not have HIV, but are at risk, it may be recommended that you take a prescription medicine daily to prevent HIV infection. This is called pre-exposure prophylaxis (PrEP). You are considered at risk if: ? You are sexually active and do not regularly use condoms or know the HIV status of your partner(s). ? You take drugs by injection. ? You are sexually active with a partner who has HIV. Talk with your health care provider about whether you are at high risk of being infected with HIV. If you choose to begin PrEP, you should first be tested for HIV. You should then be tested every 3 months for as long as you are taking PrEP. Pregnancy  If you are premenopausal and you may become pregnant, ask your health care provider about preconception counseling.  If you may become pregnant, take 400 to 800 micrograms (mcg) of folic acid every day.  If you want to prevent pregnancy, talk to your health care provider about birth control (contraception). Osteoporosis and menopause  Osteoporosis is a disease in which the bones lose minerals and strength with aging. This can result in serious bone fractures. Your risk for osteoporosis can be identified using a bone density scan.  If you are 107 years of age or older, or if you are at risk for osteoporosis and fractures, ask your health care provider if you should be  screened.  Ask your health care provider whether you should take a calcium or vitamin D supplement to lower your risk for osteoporosis.  Menopause may have certain physical symptoms and risks.  Hormone replacement therapy may reduce some of these symptoms and risks. Talk to your health care provider about whether hormone replacement therapy is right for you. Follow these instructions at home:  Schedule regular health, dental, and eye exams.  Stay current with your immunizations.  Do not use any tobacco products including cigarettes, chewing tobacco, or electronic cigarettes.  If you are pregnant, do not drink alcohol.  If you are breastfeeding, limit how much and how often you drink alcohol.  Limit alcohol intake to no more than 1 drink per day for nonpregnant women. One drink equals 12 ounces of beer, 5 ounces of wine, or 1 ounces of hard liquor.  Do not use street drugs.  Do not share needles.  Ask your health care provider for help if you need support or information about quitting drugs.  Tell your health care provider if you often feel depressed.  Tell your health care provider if you have ever been abused or do not feel safe at home. This information is not intended to replace advice given to you by your health care provider. Make sure you discuss any questions you have with your health care provider. Document Released: 08/13/2010 Document Revised: 07/06/2015 Document Reviewed: 11/01/2014 Elsevier Interactive Patient Education  2019 [employer].   Otitis Media, Adult  Otitis media means that the middle ear is red and swollen (inflamed) and full of fluid. The condition usually goes away on its own. Follow these instructions at home:  Take over-the-counter and prescription medicines only as told by your doctor.  If you were prescribed an antibiotic medicine, take it as told by your doctor. Do not stop taking the antibiotic even if you start to feel better.  Keep all  follow-up visits as told by your  doctor. This is important. Contact a doctor if:  You have bleeding from your nose.  There is a lump on your neck.  You are not getting better in 5 days.  You feel worse instead of better. Get help right away if:  You have pain that is not helped with medicine.  You have swelling, redness, or pain around your ear.  You get a stiff neck.  You cannot move part of your face (paralyzed).  You notice that the bone behind your ear hurts when you touch it.  You get a very bad headache. Summary  Otitis media means that the middle ear is red, swollen, and full of fluid.  This condition usually goes away on its own. In some cases, treatment may be needed.  If you were prescribed an antibiotic medicine, take it as told by your doctor. This information is not intended to replace advice given to you by your health care provider. Make sure you discuss any questions you have with your health care provider. Document Released: 07/17/2007 Document Revised: 02/19/2016 Document Reviewed: 02/19/2016 Elsevier Interactive Patient Education  2019 [employer] Inc.  Amoxicillin capsules or tablets What is this medicine? AMOXICILLIN (a mox i SIL in) is a penicillin antibiotic. It is used to treat certain kinds of bacterial infections. It will not work for colds, flu, or other viral infections. This medicine may be used for other purposes; ask your health care provider or pharmacist if you have questions. COMMON BRAND NAME(S): Amoxil, Moxilin, Sumox, Trimox What should I tell my health care provider before I take this medicine? They need to know if you have any of these conditions: -asthma -kidney disease -an unusual or allergic reaction to amoxicillin, other penicillins, cephalosporin antibiotics, other medicines, foods, dyes, or preservatives -pregnant or trying to get pregnant -breast-feeding How should I use this medicine? Take this medicine by mouth with a glass  of water. Follow the directions on your prescription label. You may take this medicine with food or on an empty stomach. Take your medicine at regular intervals. Do not take your medicine more often than directed. Take all of your medicine as directed even if you think your are better. Do not skip doses or stop your medicine early. Talk to your pediatrician regarding the use of this medicine in children. While this drug may be prescribed for selected conditions, precautions do apply. Overdosage: If you think you have taken too much of this medicine contact a poison control center or emergency room at once. NOTE: This medicine is only for you. Do not share this medicine with others. What if I miss a dose? If you miss a dose, take it as soon as you can. If it is almost time for your next dose, take only that dose. Do not take double or extra doses. What may interact with this medicine? -amiloride -birth control pills -chloramphenicol -macrolides -probenecid -sulfonamides -tetracyclines This list may not describe all possible interactions. Give your health care provider a list of all the medicines, herbs, non-prescription drugs, or dietary supplements you use. Also tell them if you smoke, drink alcohol, or use illegal drugs. Some items may interact with your medicine. What should I watch for while using this medicine? Tell your doctor or health care professional if your symptoms do not improve in 2 or 3 days. Take all of the doses of your medicine as directed. Do not skip doses or stop your medicine early. If you are diabetic, you may get a false positive result  for sugar in your urine with certain brands of urine tests. Check with your doctor. Do not treat diarrhea with over-the-counter products. Contact your doctor if you have diarrhea that lasts more than 2 days or if the diarrhea is severe and watery. What side effects may I notice from receiving this medicine? Side effects that you should  report to your doctor or health care professional as soon as possible: -allergic reactions like skin rash, itching or hives, swelling of the face, lips, or tongue -breathing problems -dark urine -redness, blistering, peeling or loosening of the skin, including inside the mouth -seizures -severe or watery diarrhea -trouble passing urine or change in the amount of urine -unusual bleeding or bruising -unusually weak or tired -yellowing of the eyes or skin Side effects that usually do not require medical attention (report to your doctor or health care professional if they continue or are bothersome): -dizziness -headache -stomach upset -trouble sleeping This list may not describe all possible side effects. Call your doctor for medical advice about side effects. You may report side effects to FDA at 1-800-FDA-1088. Where should I keep my medicine? Keep out of the reach of children. Store between 68 and 77 degrees F (20 and 25 degrees C). Keep bottle closed tightly. Throw away any unused medicine after the expiration date. NOTE: This sheet is a summary. It may not cover all possible information. If you have questions about this medicine, talk to your doctor, pharmacist, or health care provider.  2019 Elsevier/Gold Standard (2007-04-21 14:10:59)

## 2018-04-14 NOTE — Progress Notes (Signed)
[facility]   

## 2018-04-15 ENCOUNTER — Encounter: Payer: Self-pay | Admitting: Adult Health

## 2018-04-15 LAB — LIPID PANEL WITH LDL/HDL RATIO
Cholesterol, Total: 212 mg/dL — ABNORMAL HIGH (ref 100–199)
HDL: 36 mg/dL — ABNORMAL LOW (ref >39)
LDL Calculated: 142 mg/dL — ABNORMAL HIGH (ref 0–99)
LDl/HDL Ratio: 3.9 ratio — ABNORMAL HIGH (ref 0.0–3.2)
Triglycerides: 170 mg/dL — ABNORMAL HIGH (ref 0–149)
VLDL CHOLESTEROL CAL: 34 mg/dL (ref 5–40)

## 2018-04-15 LAB — GLUCOSE, RANDOM: Glucose: 133 mg/dL — ABNORMAL HIGH (ref 65–99)

## 2018-04-15 NOTE — Progress Notes (Signed)
Released to [facility] with results and patient teaching 04/15/18 2:57pm. Office line to call if questions was given.  Keep follow up with primary care.

## 2018-04-24 ENCOUNTER — Other Ambulatory Visit: Payer: Self-pay

## 2018-04-24 ENCOUNTER — Ambulatory Visit (INDEPENDENT_AMBULATORY_CARE_PROVIDER_SITE_OTHER): Payer: Managed Care, Other (non HMO) | Admitting: Family Medicine

## 2018-04-24 ENCOUNTER — Encounter: Payer: Self-pay | Admitting: Family Medicine

## 2018-04-24 VITALS — BP 118/83 | HR 68 | Temp 97.9°F | Wt 349.5 lb

## 2018-04-24 DIAGNOSIS — G8929 Other chronic pain: Secondary | ICD-10-CM | POA: Diagnosis not present

## 2018-04-24 DIAGNOSIS — M545 Low back pain, unspecified: Secondary | ICD-10-CM

## 2018-04-24 DIAGNOSIS — M25561 Pain in right knee: Secondary | ICD-10-CM

## 2018-04-24 MED ORDER — CYCLOBENZAPRINE HCL 5 MG PO TABS: 5.0000 mg | ORAL_TABLET | Freq: Three times a day (TID) | ORAL | 1 refills | Status: DC | PRN

## 2018-04-24 MED ORDER — KETOROLAC TROMETHAMINE 60 MG/2ML IM SOLN
60.0000 mg | Freq: Once | INTRAMUSCULAR | Status: AC
  Administered 2018-04-24: 60 mg via INTRAMUSCULAR

## 2018-04-24 MED ORDER — HYDROCODONE-ACETAMINOPHEN 5-325 MG PO TABS: 1.0000 | ORAL_TABLET | Freq: Four times a day (QID) | ORAL | 0 refills | Status: DC | PRN
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
## 2018-04-24 NOTE — Patient Instructions (Addendum)
Diet Recommendations for Diabetes   Starchy (carb) foods include: Bread, rice, pasta, potatoes, corn, crackers, bagels, muffins, all baked goods.  (Fruits, milk, and yogurt also have carbohydrate, but most of these foods will not spike your blood sugar as the starchy foods will.)  A few fruits do cause high blood sugars; use small portions of bananas (limit to 1/2 at a time), grapes, watermelon, and most tropical fruits.    Protein foods include: Meat, fish, poultry, eggs, dairy foods, and beans such as pinto and kidney beans (beans also provide carbohydrate).    1. Limit starchy foods to TWO per meal and ONE per snack. ONE portion of a starchy  food is equal to the following:   - ONE slice of bread (or its equivalent, such as half of a hamburger bun).   - 1/2 cup of a "scoopable" starchy food such as potatoes or rice.   - 15 grams of carbohydrate as shown on food label.  2. Include at every meal: a protein food, a carb food, and vegetables and/or fruit.   - Obtain twice as many veg's as protein or carbohydrate foods for both lunch and dinner.   - Fresh or frozen veg's are best.   - Try to keep frozen veg's on hand for a quick vegetable serving.         Back Exercises The following exercises strengthen the muscles that help to support the back. They also help to keep the lower back flexible. Doing these exercises can help to prevent back pain or lessen existing pain. If you have back pain or discomfort, try doing these exercises 2-3 times each day or as told by your health care provider. When the pain goes away, do them once each day, but increase the number of times that you repeat the steps for each exercise (do more repetitions). If you do not have back pain or discomfort, do these exercises once each day or as told by your health care provider. Exercises Single Knee to Chest Repeat these steps 3-5 times for each leg: 1. Lie on your back on a firm bed or the floor with your legs extended.  2. Bring one knee to your chest. Your other leg should stay extended and in contact with the floor. 3. Hold your knee in place by grabbing your knee or thigh. 4. Pull on your knee until you feel a gentle stretch in your lower back. 5. Hold the stretch for 10-30 seconds. 6. Slowly release and straighten your leg. Pelvic Tilt Repeat these steps 5-10 times: 1. Lie on your back on a firm bed or the floor with your legs extended. 2. Bend your knees so they are pointing toward the ceiling and your feet are flat on the floor. 3. Tighten your lower abdominal muscles to press your lower back against the floor. This motion will tilt your pelvis so your tailbone points up toward the ceiling instead of pointing to your feet or the floor. 4. With gentle tension and even breathing, hold this position for 5-10 seconds. Cat-Cow Repeat these steps until your lower back becomes more flexible: 1. Get into a hands-and-knees position on a firm surface. Keep your hands under your shoulders, and keep your knees under your hips. You may place padding under your knees for comfort. 2. Let your head hang down, and point your tailbone toward the floor so your lower back becomes rounded like the back of a cat. 3. Hold this position for 5 seconds. 4. Slowly  lift your head and point your tailbone up toward the ceiling so your back forms a sagging arch like the back of a cow. 5. Hold this position for 5 seconds.  Press-Ups Repeat these steps 5-10 times: 1. Lie on your abdomen (face-down) on the floor. 2. Place your palms near your head, about shoulder-width apart. 3. While you keep your back as relaxed as possible and keep your hips on the floor, slowly straighten your arms to raise the top half of your body and lift your shoulders. Do not use your back muscles to raise your upper torso. You may adjust the placement of your hands to make yourself more comfortable. 4. Hold this position for 5 seconds while you keep your  back relaxed. 5. Slowly return to lying flat on the floor.  Bridges Repeat these steps 10 times: 1. Lie on your back on a firm surface. 2. Bend your knees so they are pointing toward the ceiling and your feet are flat on the floor. 3. Tighten your buttocks muscles and lift your buttocks off of the floor until your waist is at almost the same height as your knees. You should feel the muscles working in your buttocks and the back of your thighs. If you do not feel these muscles, slide your feet 1-2 inches farther away from your buttocks. 4. Hold this position for 3-5 seconds. 5. Slowly lower your hips to the starting position, and allow your buttocks muscles to relax completely. If this exercise is too easy, try doing it with your arms crossed over your chest. Abdominal Crunches Repeat these steps 5-10 times: 1. Lie on your back on a firm bed or the floor with your legs extended. 2. Bend your knees so they are pointing toward the ceiling and your feet are flat on the floor. 3. Cross your arms over your chest. 4. Tip your chin slightly toward your chest without bending your neck. 5. Tighten your abdominal muscles and slowly raise your trunk (torso) high enough to lift your shoulder blades a tiny bit off of the floor. Avoid raising your torso higher than that, because it can put too much stress on your low back and it does not help to strengthen your abdominal muscles. 6. Slowly return to your starting position. Back Lifts Repeat these steps 5-10 times: 1. Lie on your abdomen (face-down) with your arms at your sides, and rest your forehead on the floor. 2. Tighten the muscles in your legs and your buttocks. 3. Slowly lift your chest off of the floor while you keep your hips pressed to the floor. Keep the back of your head in line with the curve in your back. Your eyes should be looking at the floor. 4. Hold this position for 3-5 seconds. 5. Slowly return to your starting position. Contact a  health care provider if:  Your back pain or discomfort gets much worse when you do an exercise.  Your back pain or discomfort does not lessen within 2 hours after you exercise. If you have any of these problems, stop doing these exercises right away. Do not do them again unless your health care provider says that you can. Get help right away if:  You develop sudden, severe back pain. If this happens, stop doing the exercises right away. Do not do them again unless your health care provider says that you can. This information is not intended to replace advice given to you by your health care provider. Make sure you discuss any questions  you have with your health care provider. Document Released: 03/07/2004 Document Revised: 06/03/2017 Document Reviewed: 03/24/2014 Elsevier Interactive Patient Education  [employer].

## 2018-05-05 ENCOUNTER — Telehealth: Payer: Self-pay

## 2018-05-05 NOTE — Telephone Encounter (Signed)
Patient has an appt scheduled on April 10th for DM follow up. She was previously a Engineer, agricultural patient. She is willing to reschedule, but she will run out of Jardiance before then. Offered an e visit, but patient works at [employer] and does not know another time that would work for her. Please call the patient to let her know if she can keep appt or can refills be sent into the pharmacy to last until she can be seen in the office again. Contact info is correct. You can leave detailed message on phone. Thanks!

## 2018-05-06 NOTE — Telephone Encounter (Signed)
We could send a refill and strill see her virtually or in office on April 10th.  The evisit could be at same time that that visit was.

## 2018-05-07 ENCOUNTER — Telehealth: Payer: Self-pay

## 2018-05-07 NOTE — Telephone Encounter (Signed)
Patient would like to still come in to the office to be seen to have blood work done. Patient states she will discuss at office visit. FYI

## 2018-05-07 NOTE — Telephone Encounter (Signed)
That's fine

## 2018-05-08 ENCOUNTER — Other Ambulatory Visit: Payer: Self-pay

## 2018-05-08 MED ORDER — GLUCOSE BLOOD VI STRP: ORAL_STRIP | 1 refills | Status: DC

## 2018-05-08 MED ORDER — EMPAGLIFLOZIN 10 MG PO TABS: 10.0000 mg | ORAL_TABLET | Freq: Every day | ORAL | 1 refills | Status: DC

## 2018-05-08 MED ORDER — ONETOUCH DELICA LANCETS 33G MISC: 1.0000 | Freq: Three times a day (TID) | 1 refills | Status: DC

## 2018-05-08 NOTE — Telephone Encounter (Signed)
Patient called office stating that she has contact office 3x timers this week requesting refill on Jardiance, test strips and lancets and pharmacy stated they had not received refill approval. Patient stating that she is about to be out of Jardiance and needs this as soon as possible. Patient has a upcoming appt scheduled on 05/22/18. Please review. KW

## 2018-05-13 ENCOUNTER — Telehealth: Payer: Self-pay | Admitting: *Deleted

## 2018-05-13 DIAGNOSIS — M545 Low back pain, unspecified: Secondary | ICD-10-CM

## 2018-05-13 MED ORDER — METHYLPREDNISOLONE 4 MG PO TBPK: ORAL_TABLET | ORAL | 0 refills | Status: DC

## 2018-05-13 NOTE — Telephone Encounter (Signed)
Patient was seen in office 04/24/2018 for acute right-sided low back pain without sciatica. Patient states her pain had eased off however she states pain has come back worse now. Patient states pain is so bad now she can barely get up and move around. Patient wants to no what she should do. Patient works at [employer] and goes back on rotation next week. Please advise?

## 2018-05-13 NOTE — Telephone Encounter (Signed)
Will send in medrol dose pak for inflammation and pain. Continue flexeril for muscle spasm. Heating pad prn. Epsom salt soaks (2 cups epsom salt in warm bath water) can help as well.

## 2018-05-13 NOTE — Telephone Encounter (Signed)
Spoke with patient and gave her information about the prescription and directions on other methods listed.

## 2018-05-21 ENCOUNTER — Other Ambulatory Visit: Payer: Self-pay

## 2018-05-21 ENCOUNTER — Ambulatory Visit (INDEPENDENT_AMBULATORY_CARE_PROVIDER_SITE_OTHER): Payer: Managed Care, Other (non HMO) | Admitting: Physician Assistant

## 2018-05-21 ENCOUNTER — Encounter: Payer: Self-pay | Admitting: Physician Assistant

## 2018-05-21 VITALS — BP 130/88 | HR 80 | Temp 98.3°F | Resp 16 | Ht 67.0 in | Wt 336.5 lb

## 2018-05-21 DIAGNOSIS — M545 Low back pain, unspecified: Secondary | ICD-10-CM

## 2018-05-21 DIAGNOSIS — E1169 Type 2 diabetes mellitus with other specified complication: Secondary | ICD-10-CM | POA: Diagnosis not present

## 2018-05-21 DIAGNOSIS — E119 Type 2 diabetes mellitus without complications: Secondary | ICD-10-CM | POA: Diagnosis not present

## 2018-05-21 DIAGNOSIS — Z90710 Acquired absence of both cervix and uterus: Secondary | ICD-10-CM | POA: Diagnosis not present

## 2018-05-21 DIAGNOSIS — E785 Hyperlipidemia, unspecified: Secondary | ICD-10-CM

## 2018-05-21 LAB — POCT GLYCOSYLATED HEMOGLOBIN (HGB A1C)
Est. average glucose Bld gHb Est-mCnc: 148
Hemoglobin A1C: 6.8 % — AB (ref 4.0–5.6)

## 2018-05-21 LAB — MICROALBUMIN / CREATININE URINE RATIO

## 2018-05-21 MED ORDER — MELOXICAM 7.5 MG PO TABS: 7.5000 mg | ORAL_TABLET | Freq: Every day | ORAL | 0 refills | Status: DC | PRN

## 2018-05-21 MED ORDER — ATORVASTATIN CALCIUM 10 MG PO TABS: 10.0000 mg | ORAL_TABLET | Freq: Every day | ORAL | 0 refills | Status: DC

## 2018-05-21 NOTE — Patient Instructions (Signed)
Diabetes Mellitus and Exercise Exercising regularly is important for your overall health, especially when you have diabetes (diabetes mellitus). Exercising is not only about losing weight. It has many other health benefits, such as increasing muscle strength and bone density and reducing body fat and stress. This leads to improved fitness, flexibility, and endurance, all of which result in better overall health. Exercise has additional benefits for people with diabetes, including:  Reducing appetite.  Helping to lower and control blood glucose.  Lowering blood pressure.  Helping to control amounts of fatty substances (lipids) in the blood, such as cholesterol and triglycerides.  Helping the body to respond better to insulin (improving insulin sensitivity).  Reducing how much insulin the body needs.  Decreasing the risk for heart disease by: ? Lowering cholesterol and triglyceride levels. ? Increasing the levels of good cholesterol. ? Lowering blood glucose levels. What is my activity plan? Your health care provider or certified diabetes educator can help you make a plan for the type and frequency of exercise (activity plan) that works for you. Make sure that you:  Do at least 150 minutes of moderate-intensity or vigorous-intensity exercise each week. This could be brisk walking, biking, or water aerobics. ? Do stretching and strength exercises, such as yoga or weightlifting, at least 2 times a week. ? Spread out your activity over at least 3 days of the week.  Get some form of physical activity every day. ? Do not go more than 2 days in a row without some kind of physical activity. ? Avoid being inactive for more than 30 minutes at a time. Take frequent breaks to walk or stretch.  Choose a type of exercise or activity that you enjoy, and set realistic goals.  Start slowly, and gradually increase the intensity of your exercise over time. What do I need to know about managing my  diabetes?   Check your blood glucose before and after exercising. ? If your blood glucose is 240 mg/dL (13.3 mmol/L) or higher before you exercise, check your urine for ketones. If you have ketones in your urine, do not exercise until your blood glucose returns to normal. ? If your blood glucose is 100 mg/dL (5.6 mmol/L) or lower, eat a snack containing 15-20 grams of carbohydrate. Check your blood glucose 15 minutes after the snack to make sure that your level is above 100 mg/dL (5.6 mmol/L) before you start your exercise.  Know the symptoms of low blood glucose (hypoglycemia) and how to treat it. Your risk for hypoglycemia increases during and after exercise. Common symptoms of hypoglycemia can include: ? Hunger. ? Anxiety. ? Sweating and feeling clammy. ? Confusion. ? Dizziness or feeling light-headed. ? Increased heart rate or palpitations. ? Blurry vision. ? Tingling or numbness around the mouth, lips, or tongue. ? Tremors or shakes. ? Irritability.  Keep a rapid-acting carbohydrate snack available before, during, and after exercise to help prevent or treat hypoglycemia.  Avoid injecting insulin into areas of the body that are going to be exercised. For example, avoid injecting insulin into: ? The arms, when playing tennis. ? The legs, when jogging.  Keep records of your exercise habits. Doing this can help you and your health care provider adjust your diabetes management plan as needed. Write down: ? Food that you eat before and after you exercise. ? Blood glucose levels before and after you exercise. ? The type and amount of exercise you have done. ? When your insulin is expected to peak, if you use   insulin. Avoid exercising at times when your insulin is peaking.  When you start a new exercise or activity, work with your health care provider to make sure the activity is safe for you, and to adjust your insulin, medicines, or food intake as needed.  Drink plenty of water while  you exercise to prevent dehydration or heat stroke. Drink enough fluid to keep your urine clear or pale yellow. Summary  Exercising regularly is important for your overall health, especially when you have diabetes (diabetes mellitus).  Exercising has many health benefits, such as increasing muscle strength and bone density and reducing body fat and stress.  Your health care provider or certified diabetes educator can help you make a plan for the type and frequency of exercise (activity plan) that works for you.  When you start a new exercise or activity, work with your health care provider to make sure the activity is safe for you, and to adjust your insulin, medicines, or food intake as needed. This information is not intended to replace advice given to you by your health care provider. Make sure you discuss any questions you have with your health care provider. Document Released: 04/20/2003 Document Revised: 08/08/2016 Document Reviewed: 07/10/2015 Elsevier Interactive Patient Education  2019 [employer]  
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
## 2018-05-22 ENCOUNTER — Ambulatory Visit: Payer: Self-pay | Admitting: Family Medicine

## 2018-06-03 NOTE — Addendum Note (Signed)
Addended by: Doreen Beam on: 06/03/2018 11:43 AM   Modules accepted: Level of Service

## 2018-06-04 NOTE — Addendum Note (Signed)
Addended by: Doreen Beam on: 06/04/2018 09:54 AM   Modules accepted: Level of Service

## 2018-06-12 ENCOUNTER — Ambulatory Visit: Payer: Self-pay | Admitting: Family Medicine

## 2018-07-06 ENCOUNTER — Other Ambulatory Visit: Payer: Self-pay | Admitting: Family Medicine

## 2018-08-07 ENCOUNTER — Other Ambulatory Visit: Payer: Self-pay | Admitting: Physician Assistant

## 2018-08-07 DIAGNOSIS — E1169 Type 2 diabetes mellitus with other specified complication: Secondary | ICD-10-CM

## 2018-08-11 ENCOUNTER — Ambulatory Visit (INDEPENDENT_AMBULATORY_CARE_PROVIDER_SITE_OTHER): Payer: Managed Care, Other (non HMO) | Admitting: Physician Assistant

## 2018-08-11 ENCOUNTER — Other Ambulatory Visit: Payer: Self-pay

## 2018-08-11 VITALS — BP 135/90 | HR 84 | Temp 98.0°F | Resp 16 | Ht 67.0 in | Wt 349.0 lb

## 2018-08-11 DIAGNOSIS — M545 Low back pain, unspecified: Secondary | ICD-10-CM

## 2018-08-11 DIAGNOSIS — M25561 Pain in right knee: Secondary | ICD-10-CM

## 2018-08-11 DIAGNOSIS — G8929 Other chronic pain: Secondary | ICD-10-CM | POA: Diagnosis not present

## 2018-08-11 MED ORDER — CYCLOBENZAPRINE HCL 10 MG PO TABS: 10.0000 mg | ORAL_TABLET | Freq: Two times a day (BID) | ORAL | 0 refills | Status: DC | PRN

## 2018-08-11 MED ORDER — KETOROLAC TROMETHAMINE 60 MG/2ML IM SOLN
60.0000 mg | Freq: Once | INTRAMUSCULAR | Status: AC
  Administered 2018-08-11: 60 mg via INTRAMUSCULAR
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
## 2018-08-11 NOTE — Patient Instructions (Signed)
I have sent in Flexeril 10 mg twice daily.   Acute Back Pain, Adult Acute back pain is sudden and usually short-lived. It is often caused by an injury to the muscles and tissues in the back. The injury may result from:  A muscle or ligament getting overstretched or torn (strained). Ligaments are tissues that connect bones to each other. Lifting something improperly can cause a back strain.  Wear and tear (degeneration) of the spinal disks. Spinal disks are circular tissue that provides cushioning between the bones of the spine (vertebrae).  Twisting motions, such as while playing sports or doing yard work.  A hit to the back.  Arthritis. You may have a physical exam, lab tests, and imaging tests to find the cause of your pain. Acute back pain usually goes away with rest and home care. Follow these instructions at home: Managing pain, stiffness, and swelling  Take over-the-counter and prescription medicines only as told by your health care provider.  Your health care provider may recommend applying ice during the first 24-48 hours after your pain starts. To do this: ? Put ice in a plastic bag. ? Place a towel between your skin and the bag. ? Leave the ice on for 20 minutes, 2-3 times a day.  If directed, apply heat to the affected area as often as told by your health care provider. Use the heat source that your health care provider recommends, such as a moist heat pack or a heating pad. ? Place a towel between your skin and the heat source. ? Leave the heat on for 20-30 minutes. ? Remove the heat if your skin turns bright red. This is especially important if you are unable to feel pain, heat, or cold. You have a greater risk of getting burned. Activity   Do not stay in bed. Staying in bed for more than 1-2 days can delay your recovery.  Sit up and stand up straight. Avoid leaning forward when you sit, or hunching over when you stand. ? If you work at a desk, sit close to it so you  do not need to lean over. Keep your chin tucked in. Keep your neck drawn back, and keep your elbows bent at a right angle. Your arms should look like the letter "L." ? Sit high and close to the steering wheel when you drive. Add lower back (lumbar) support to your car seat, if needed.  Take short walks on even surfaces as soon as you are able. Try to increase the length of time you walk each day.  Do not sit, drive, or stand in one place for more than 30 minutes at a time. Sitting or standing for long periods of time can put stress on your back.  Do not drive or use heavy machinery while taking prescription pain medicine.  Use proper lifting techniques. When you bend and lift, use positions that put less stress on your back: ? [place] your knees. ? Keep the load close to your body. ? Avoid twisting.  Exercise regularly as told by your health care provider. Exercising helps your back heal faster and helps prevent back injuries by keeping muscles strong and flexible.  Work with a physical therapist to make a safe exercise program, as recommended by your health care provider. Do any exercises as told by your physical therapist. Lifestyle  Maintain a healthy weight. Extra weight puts stress on your back and makes it difficult to have good posture.  Avoid activities or situations that  make you feel anxious or stressed. Stress and anxiety increase muscle tension and can make back pain worse. Learn ways to manage anxiety and stress, such as through exercise. General instructions  Sleep on a firm mattress in a comfortable position. Try lying on your side with your knees slightly bent. If you lie on your back, put a pillow under your knees.  Follow your treatment plan as told by your health care provider. This may include: ? Cognitive or behavioral therapy. ? Acupuncture or massage therapy. ? Meditation or yoga. Contact a health care provider if:  You have pain that is not relieved with rest or  medicine.  You have increasing pain going down into your legs or buttocks.  Your pain does not improve after 2 weeks.  You have pain at night.  You lose weight without trying.  You have a fever or chills. Get help right away if:  You develop new bowel or bladder control problems.  You have unusual weakness or numbness in your arms or legs.  You develop nausea or vomiting.  You develop abdominal pain.  You feel faint. Summary  Acute back pain is sudden and usually short-lived.  Use proper lifting techniques. When you bend and lift, use positions that put less stress on your back.  Take over-the-counter and prescription medicines and apply heat or ice as directed by your health care provider. This information is not intended to replace advice given to you by your health care provider. Make sure you discuss any questions you have with your health care provider. Document Released: 01/28/2005 Document Revised: 05/19/2018 Document Reviewed: 09/11/2016 Elsevier Patient Education  2020 [employer].

## 2018-08-20 ENCOUNTER — Telehealth: Payer: Self-pay

## 2018-08-20 NOTE — Telephone Encounter (Signed)
Patient advised as below. rescheduled

## 2018-08-20 NOTE — Telephone Encounter (Signed)
She can do virtual visit and get her labs at a later point. Or she can reschedule visit two weeks from now.

## 2018-08-20 NOTE — Telephone Encounter (Signed)
Patient is scheduled for an office visit tomorrow. She works at [employer] and states her [place]. Notified all staff that an employee at her job was exposed to someone who tested positive for COVID. Patient says that HR has notified everyone who came in contact with that employee, and now half the office is out. Patient doesn't think she came in contact with that person. She wants to know if she would still be able to come into the office for her visit tomorrow. She also has a question about if she should be tested for COVID. Her job will not  test her because she didn't have contact with the person who was exposed.

## 2018-08-21 ENCOUNTER — Ambulatory Visit: Payer: Self-pay | Admitting: Physician Assistant

## 2018-09-03 ENCOUNTER — Other Ambulatory Visit: Payer: Self-pay | Admitting: Family Medicine

## 2018-09-04 ENCOUNTER — Ambulatory Visit: Payer: Managed Care, Other (non HMO) | Admitting: Physician Assistant

## 2018-09-04 ENCOUNTER — Other Ambulatory Visit: Payer: Self-pay

## 2018-09-04 ENCOUNTER — Encounter: Payer: Self-pay | Admitting: Physician Assistant

## 2018-09-04 VITALS — BP 113/78 | HR 79 | Temp 98.1°F | Wt 354.0 lb

## 2018-09-04 DIAGNOSIS — Z01818 Encounter for other preprocedural examination: Secondary | ICD-10-CM

## 2018-09-04 DIAGNOSIS — E119 Type 2 diabetes mellitus without complications: Secondary | ICD-10-CM | POA: Diagnosis not present

## 2018-09-04 DIAGNOSIS — Z6841 Body Mass Index (BMI) 40.0 and over, adult: Secondary | ICD-10-CM

## 2018-09-04 LAB — POCT URINALYSIS DIPSTICK
Bilirubin, UA: NEGATIVE
Glucose, UA: POSITIVE — AB
Ketones, UA: NEGATIVE
Leukocytes, UA: NEGATIVE
Nitrite, UA: NEGATIVE
Protein, UA: NEGATIVE
Spec Grav, UA: 1.02 (ref 1.010–1.025)
Urobilinogen, UA: 0.2 E.U./dL
pH, UA: 5 (ref 5.0–8.0)

## 2018-09-04 LAB — POCT GLYCOSYLATED HEMOGLOBIN (HGB A1C): Hemoglobin A1C: 7.1 % — AB (ref 4.0–5.6)

## 2018-09-04 LAB — POCT UA - MICROALBUMIN: Microalbumin Ur, POC: 20 mg/L
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
## 2018-09-05 LAB — CBC WITH DIFFERENTIAL/PLATELET
Basophils Absolute: 0.1 10*3/uL (ref 0.0–0.2)
Basos: 1 %
EOS (ABSOLUTE): 0.2 10*3/uL (ref 0.0–0.4)
Eos: 2 %
Hematocrit: 40 % (ref 34.0–46.6)
Hemoglobin: 12.7 g/dL (ref 11.1–15.9)
Immature Grans (Abs): 0.1 10*3/uL (ref 0.0–0.1)
Immature Granulocytes: 1 %
Lymphocytes Absolute: 3 10*3/uL (ref 0.7–3.1)
Lymphs: 28 %
MCH: 25.9 pg — ABNORMAL LOW (ref 26.6–33.0)
MCHC: 31.8 g/dL (ref 31.5–35.7)
MCV: 82 fL (ref 79–97)
Monocytes Absolute: 0.6 10*3/uL (ref 0.1–0.9)
Monocytes: 5 %
Neutrophils Absolute: 7.1 10*3/uL — ABNORMAL HIGH (ref 1.4–7.0)
Neutrophils: 63 %
Platelets: 283 10*3/uL (ref 150–450)
RBC: 4.9 x10E6/uL (ref 3.77–5.28)
RDW: 15.1 % (ref 11.7–15.4)
WBC: 11 10*3/uL — ABNORMAL HIGH (ref 3.4–10.8)

## 2018-09-05 LAB — COMPREHENSIVE METABOLIC PANEL
ALT: 16 IU/L (ref 0–32)
AST: 12 IU/L (ref 0–40)
Albumin/Globulin Ratio: 1.6 (ref 1.2–2.2)
Albumin: 4.3 g/dL (ref 3.8–4.8)
Alkaline Phosphatase: 65 IU/L (ref 39–117)
BUN/Creatinine Ratio: 22 (ref 9–23)
BUN: 17 mg/dL (ref 6–24)
Bilirubin Total: 0.4 mg/dL (ref 0.0–1.2)
CO2: 20 mmol/L (ref 20–29)
Calcium: 9.1 mg/dL (ref 8.7–10.2)
Chloride: 102 mmol/L (ref 96–106)
Creatinine, Ser: 0.77 mg/dL (ref 0.57–1.00)
GFR calc Af Amer: 108 mL/min/{1.73_m2} (ref >59)
GFR calc non Af Amer: 94 mL/min/{1.73_m2} (ref >59)
Globulin, Total: 2.7 g/dL (ref 1.5–4.5)
Glucose: 99 mg/dL (ref 65–99)
Potassium: 4.2 mmol/L (ref 3.5–5.2)
Sodium: 139 mmol/L (ref 134–144)
Total Protein: 7 g/dL (ref 6.0–8.5)

## 2018-09-07 ENCOUNTER — Encounter: Payer: Self-pay | Admitting: Physician Assistant

## 2018-09-16 ENCOUNTER — Other Ambulatory Visit: Payer: Self-pay

## 2018-09-16 ENCOUNTER — Telehealth: Payer: Self-pay | Admitting: Family Medicine

## 2018-09-16 ENCOUNTER — Encounter: Payer: Self-pay | Admitting: Family Medicine

## 2018-09-16 ENCOUNTER — Ambulatory Visit: Payer: Managed Care, Other (non HMO) | Admitting: Family Medicine

## 2018-09-16 VITALS — BP 112/77 | HR 80 | Temp 98.3°F | Wt 349.0 lb

## 2018-09-16 DIAGNOSIS — H60501 Unspecified acute noninfective otitis externa, right ear: Secondary | ICD-10-CM | POA: Diagnosis not present

## 2018-09-16 MED ORDER — NEOMYCIN-POLYMYXIN-HC 3.5-10000-1 OT SOLN: 3.0000 [drp] | Freq: Four times a day (QID) | OTIC | 0 refills | Status: AC

## 2018-09-16 MED ORDER — FLUCONAZOLE 150 MG PO TABS: 150.0000 mg | ORAL_TABLET | Freq: Once | ORAL | 0 refills | Status: AC

## 2018-09-16 MED ORDER — AMOXICILLIN 500 MG PO CAPS: 1000.0000 mg | ORAL_CAPSULE | Freq: Two times a day (BID) | ORAL | 0 refills | Status: AC

## 2018-09-16 NOTE — Telephone Encounter (Signed)
Patient scheduled with Dr. Caryn Section at 10:40 today.

## 2018-09-16 NOTE — Telephone Encounter (Signed)
Rt ear swollen - pain shoots in ear when swallowing. Pt fell asleep with ear bud in that ear and has trouble with this ear most of life.  Pt wanting to be seen in office if possible.  Please advise.  Thanks, [employer]

## 2018-09-16 NOTE — Patient Instructions (Signed)
.   Please review the attached list of medications and notify my office if there are any errors.   . Please bring all of your medications to every appointment so we can make sure that our medication list is the same as yours.   . We will have flu vaccines available after Labor Day. Please go to your pharmacy or call the office in early September to schedule you flu shot.   

## 2018-09-16 NOTE — Progress Notes (Signed)
Patient: Leslie Morris Female    DOB: [birth year]   48 y.o.   MRN: [id number] Visit Date: 09/16/2018  Today's Provider: Lelon Huh, MD   Chief Complaint  Patient presents with  . Ear Pain    Right ear. Started 09/10/2018   Subjective:     Otalgia  There is pain in the right ear. This is a new problem. The current episode started in the past 7 days. The problem occurs constantly. The problem has been gradually worsening. There has been no fever. Pertinent negatives include no abdominal pain, coughing, diarrhea, ear discharge, headaches, hearing loss, rash, rhinorrhea or sore throat. She has tried nothing for the symptoms. The treatment provided no relief. Her past medical history is significant for a chronic ear infection.    Allergies  Allergen Reactions  . Levaquin [Levofloxacin In D5w] Anaphylaxis and Rash     Current Outpatient Medications:  .  aspirin EC 81 MG tablet, Take 81 mg by mouth daily., Disp: , Rfl:  .  atorvastatin (LIPITOR) 10 MG tablet, TAKE 1 TABLET BY MOUTH EVERY DAY, Disp: 90 tablet, Rfl: 0 .  cyclobenzaprine (FLEXERIL) 10 MG tablet, Take 1 tablet (10 mg total) by mouth 2 (two) times daily as needed for muscle spasms., Disp: 30 tablet, Rfl: 0 .  empagliflozin (JARDIANCE) 10 MG TABS tablet, Take 10 mg by mouth daily., Disp: 90 tablet, Rfl: 1 .  metFORMIN (GLUCOPHAGE) 1000 MG tablet, TAKE 1 TABLET (1,000 MG TOTAL) BY MOUTH 2 (TWO) TIMES DAILY WITH A MEAL., Disp: 180 tablet, Rfl: 1 .  OneTouch Delica Lancets 81K MISC, USE AS DIRECTED 3 TIMES A DAY, Disp: 100 each, Rfl: 1 .  ONETOUCH VERIO test strip, USE AS DIRECTED, Disp: 100 strip, Rfl: 1 .  oxybutynin (DITROPAN XL) 15 MG 24 hr tablet, Take 1 tablet (15 mg total) by mouth at bedtime., Disp: 30 tablet, Rfl: 12  Review of Systems  Constitutional: Negative.   HENT: Positive for ear pain and sneezing. Negative for congestion, dental problem, drooling, ear discharge, hearing loss, postnasal drip, rhinorrhea,  sinus pressure, sinus pain, sore throat, tinnitus, trouble swallowing and voice change.   Eyes: Negative.   Respiratory: Negative.  Negative for cough.   Gastrointestinal: Negative.  Negative for abdominal pain and diarrhea.  Skin: Negative for rash.  Neurological: Negative for dizziness, light-headedness and headaches.    Social History   Tobacco Use  . Smoking status: Former Smoker    Years: 15.00    Types: Cigarettes    Quit date: 03/20/2010    Years since quitting: 8.4  . Smokeless tobacco: Never Used  Substance Use Topics  . Alcohol use: No    Alcohol/week: 0.0 standard drinks      Objective:   BP 112/77 (BP Location: Right Arm, Patient Position: Sitting, Cuff Size: Large)   Pulse 80   Temp 98.3 F (36.8 C) (Oral)   Wt (!) 349 lb (158.3 kg)   BMI 54.66 kg/m  Vitals:   09/16/18 1046  BP: 112/77  Pulse: 80  Temp: 98.3 F (36.8 C)  TempSrc: Oral  Weight: (!) 349 lb (158.3 kg)     Physical Exam  General Appearance:    Alert, cooperative, no distress  HENT:   Left and canal TM clear. Inflamed deep into left ear canal with some inflammation around periphery of TM.   Eyes:    PERRL, conjunctiva/corneas clear, EOM's intact       Lungs:  Clear to auscultation bilaterally, respirations unlabored  Heart:    Normal heart rate. Normal rhythm. No murmurs, rubs, or gallops.   Neurologic:   Awake, alert, oriented x 3. No apparent focal neurological           defect.           Assessment & Plan    1. Acute otitis externa of right ear, unspecified type  - neomycin-polymyxin-hydrocortisone (CORTISPORIN) OTIC solution; Place 3 drops into the right ear 4 (four) times daily for 7 days.  Dispense: 10 mL; Refill: 0 - amoxicillin (AMOXIL) 500 MG capsule; Take 2 capsules (1,000 mg total) by mouth 2 (two) times daily for 7 days.  Dispense: 28 capsule; Refill: 0 - fluconazole (DIFLUCAN) 150 MG tablet; Take 1 tablet (150 mg total) by mouth once for 1 dose.  Dispense: 1 tablet;  Refill: 0  The entirety of the information documented in the History of Present Illness, Review of Systems and Physical Exam were personally obtained by me. Portions of this information were initially documented by Ashley Royalty, CMA and reviewed by me for thoroughness and accuracy.      Lelon Huh, MD  [facility]

## 2018-10-05 ENCOUNTER — Other Ambulatory Visit: Payer: Self-pay | Admitting: Family Medicine

## 2018-10-05 MED ORDER — METFORMIN HCL 1000 MG PO TABS: 1000.0000 mg | ORAL_TABLET | Freq: Two times a day (BID) | ORAL | 1 refills | Status: DC

## 2018-10-05 NOTE — Telephone Encounter (Signed)
[facility] faxed refill request for the following medications:   metFORMIN (GLUCOPHAGE) 1000 MG tablet   Please advise.  

## 2018-10-12 ENCOUNTER — Encounter
Admission: RE | Admit: 2018-10-12 | Discharge: 2018-10-12 | Disposition: A | Discharge status: Home / Self Care | Payer: Managed Care, Other (non HMO) | Source: Ambulatory Visit | Attending: Orthopedic Surgery | Admitting: Orthopedic Surgery

## 2018-10-12 ENCOUNTER — Telehealth: Payer: Self-pay | Admitting: Family Medicine

## 2018-10-12 ENCOUNTER — Other Ambulatory Visit: Payer: Self-pay

## 2018-10-12 DIAGNOSIS — Z01812 Encounter for preprocedural laboratory examination: Secondary | ICD-10-CM | POA: Insufficient documentation

## 2018-10-12 MED ORDER — JARDIANCE 10 MG PO TABS: 10.0000 mg | ORAL_TABLET | Freq: Every day | ORAL | 1 refills | Status: DC

## 2018-10-12 NOTE — Telephone Encounter (Signed)
Pt needs refill on her Jardiance 10 mg  [facility]

## 2018-10-12 NOTE — Patient Instructions (Signed)
Your procedure is scheduled on: Wed. 10/21/18 Report to Day [facility]. [facility] To find out your arrival time please call [phone] between 1PM - 3PM on Tues 9/8  Remember: Instructions that are not followed completely may result in serious medical risk,  up to and including death, or upon the discretion of your surgeon and anesthesiologist your  surgery may need to be rescheduled.     _X__ 1. Do not eat food after midnight the night before your procedure.                 No gum chewing or hard candies. You may drink clear liquids up to 2 hours                 before you are scheduled to arrive for your surgery- DO not drink clear                 liquids within 2 hours of the start of your surgery.                 Clear Liquids include:  water,                 Gatorade 2, Complete 2 hours before arrival                  __X__2.  On the morning of surgery brush your teeth with toothpaste and water, you                may rinse your mouth with mouthwash if you wish.  Do not swallow any toothpaste of mouthwash.     _X__ 3.  No Alcohol for 24 hours before or after surgery.   _X__ 4.  Do Not Smoke or use e-cigarettes For 24 Hours Prior to Your Surgery.                 Do not use any chewable tobacco products for at least 6 hours prior to                 surgery.  ____  5.  Bring all medications with you on the day of surgery if instructed.   _x___  6.  Notify your doctor if there is any change in your medical condition      (cold, fever, infections).     Do not wear jewelry, make-up, hairpins, clips or nail polish. Do not wear lotions, powders, or perfumes. You may wear deodorant. Do not shave 48 hours prior to surgery. Men may shave face and neck. Do not bring valuables to the hospital.    [facility] is not responsible for any belongings or valuables.  Contacts, dentures or bridgework may not be worn into surgery. Leave your suitcase in the car. After  surgery it may be brought to your room. For patients admitted to the hospital, discharge time is determined by your treatment team.   Patients discharged the day of surgery will not be allowed to drive home.   Please read over the following fact sheets that you were given:    _x___ Take these medicines the morning of surgery with A SIP OF WATER:    1. atorvastatin (LIPITOR) 10 MG tablet  2. cyclobenzaprine (FLEXERIL) 10 MG tablet  3.   4.  5.  6.  ____ Fleet Enema (as directed)   __x__ Use CHG Soap as directed  ____ Use inhalers on the day of surgery  __x__ Stop metformin 2 days  prior to surgery Last dose on 9/6   ____ Take 1/2 of usual insulin dose the night before surgery. No insulin the morning          of surgery.   __x__ Stop aspirin on last dose on 9/2 __x__ Stop Anti-inflammatories Ibuprofen, aleve or aspirin  On 9/2   ____ Stop supplements until after surgery.    ____ Bring C-Pap to the hospital.
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
## 2018-10-16 ENCOUNTER — Other Ambulatory Visit
Admission: RE | Admit: 2018-10-16 | Discharge: 2018-10-16 | Disposition: A | Discharge status: Home / Self Care | Payer: Managed Care, Other (non HMO) | Source: Ambulatory Visit | Attending: Orthopedic Surgery | Admitting: Orthopedic Surgery

## 2018-10-16 ENCOUNTER — Other Ambulatory Visit: Payer: Self-pay

## 2018-10-16 DIAGNOSIS — Z20828 Contact with and (suspected) exposure to other viral communicable diseases: Secondary | ICD-10-CM | POA: Diagnosis not present

## 2018-10-16 DIAGNOSIS — Z01812 Encounter for preprocedural laboratory examination: Secondary | ICD-10-CM | POA: Insufficient documentation

## 2018-10-16 LAB — SARS CORONAVIRUS 2 (TAT 6-24 HRS): SARS Coronavirus 2: NEGATIVE

## 2018-10-20 ENCOUNTER — Encounter: Payer: Self-pay | Admitting: Orthopedic Surgery

## 2018-10-21 ENCOUNTER — Ambulatory Visit: Payer: Managed Care, Other (non HMO) | Admitting: Anesthesiology

## 2018-10-21 ENCOUNTER — Other Ambulatory Visit: Payer: Self-pay

## 2018-10-21 ENCOUNTER — Encounter
Admission: RE | Disposition: A | Discharge status: Home / Self Care | Payer: Self-pay | Source: Home / Self Care | Attending: Orthopedic Surgery

## 2018-10-21 ENCOUNTER — Encounter: Payer: Self-pay | Admitting: *Deleted

## 2018-10-21 ENCOUNTER — Ambulatory Visit
Admission: RE | Admit: 2018-10-21 | Discharge: 2018-10-21 | Disposition: A | Discharge status: Home / Self Care | Payer: Managed Care, Other (non HMO) | Attending: Orthopedic Surgery | Admitting: Orthopedic Surgery

## 2018-10-21 DIAGNOSIS — Z9889 Other specified postprocedural states: Secondary | ICD-10-CM

## 2018-10-21 DIAGNOSIS — S83271A Complex tear of lateral meniscus, current injury, right knee, initial encounter: Secondary | ICD-10-CM | POA: Insufficient documentation

## 2018-10-21 DIAGNOSIS — E119 Type 2 diabetes mellitus without complications: Secondary | ICD-10-CM | POA: Diagnosis not present

## 2018-10-21 DIAGNOSIS — Z87891 Personal history of nicotine dependence: Secondary | ICD-10-CM | POA: Insufficient documentation

## 2018-10-21 DIAGNOSIS — Z7984 Long term (current) use of oral hypoglycemic drugs: Secondary | ICD-10-CM | POA: Insufficient documentation

## 2018-10-21 DIAGNOSIS — Z7982 Long term (current) use of aspirin: Secondary | ICD-10-CM | POA: Diagnosis not present

## 2018-10-21 DIAGNOSIS — Z79899 Other long term (current) drug therapy: Secondary | ICD-10-CM | POA: Diagnosis not present

## 2018-10-21 DIAGNOSIS — M2391 Unspecified internal derangement of right knee: Secondary | ICD-10-CM | POA: Diagnosis present

## 2018-10-21 DIAGNOSIS — Z6841 Body Mass Index (BMI) 40.0 and over, adult: Secondary | ICD-10-CM | POA: Diagnosis not present

## 2018-10-21 DIAGNOSIS — Q615 Medullary cystic kidney: Secondary | ICD-10-CM | POA: Insufficient documentation

## 2018-10-21 DIAGNOSIS — M94261 Chondromalacia, right knee: Secondary | ICD-10-CM | POA: Diagnosis not present

## 2018-10-21 DIAGNOSIS — M2241 Chondromalacia patellae, right knee: Secondary | ICD-10-CM | POA: Diagnosis not present

## 2018-10-21 HISTORY — PX: KNEE ARTHROSCOPY: SHX127

## 2018-10-21 HISTORY — PX: MENISECTOMY: SHX5181

## 2018-10-21 HISTORY — PX: CHONDROPLASTY: SHX5177

## 2018-10-21 LAB — GLUCOSE, CAPILLARY
Glucose-Capillary: 113 mg/dL — ABNORMAL HIGH (ref 70–99)
Glucose-Capillary: 157 mg/dL — ABNORMAL HIGH (ref 70–99)

## 2018-10-21 SURGERY — ARTHROSCOPY, KNEE
Anesthesia: General | Site: Knee | Laterality: Right

## 2018-10-21 MED ORDER — HYDROCODONE-ACETAMINOPHEN 5-325 MG PO TABS: 1.0000 | ORAL_TABLET | ORAL | 0 refills | Status: DC | PRN

## 2018-10-21 MED ORDER — SODIUM CHLORIDE 0.9 % IV SOLN: INTRAVENOUS | Status: DC

## 2018-10-21 MED ORDER — MIDAZOLAM HCL 2 MG/2ML IJ SOLN
INTRAMUSCULAR | Status: AC
  Filled 2018-10-21: qty 2

## 2018-10-21 MED ORDER — DEXMEDETOMIDINE HCL 200 MCG/2ML IV SOLN
INTRAVENOUS | Status: DC | PRN
  Administered 2018-10-21 (×2): 8 ug via INTRAVENOUS
  Administered 2018-10-21: 4 ug via INTRAVENOUS

## 2018-10-21 MED ORDER — CELECOXIB 200 MG PO CAPS
400.0000 mg | ORAL_CAPSULE | Freq: Once | ORAL | Status: AC
  Administered 2018-10-21: 15:00:00 400 mg via ORAL

## 2018-10-21 MED ORDER — DEXMEDETOMIDINE HCL IN NACL 80 MCG/20ML IV SOLN
INTRAVENOUS | Status: AC
  Filled 2018-10-21: qty 20

## 2018-10-21 MED ORDER — FENTANYL CITRATE (PF) 100 MCG/2ML IJ SOLN
INTRAMUSCULAR | Status: AC
  Filled 2018-10-21: qty 2

## 2018-10-21 MED ORDER — ONDANSETRON HCL 4 MG/2ML IJ SOLN: 4.0000 mg | Freq: Four times a day (QID) | INTRAMUSCULAR | Status: DC | PRN

## 2018-10-21 MED ORDER — FENTANYL CITRATE (PF) 100 MCG/2ML IJ SOLN: 25.0000 ug | INTRAMUSCULAR | Status: DC | PRN

## 2018-10-21 MED ORDER — FAMOTIDINE 20 MG PO TABS
ORAL_TABLET | ORAL | Status: AC
  Administered 2018-10-21: 20 mg via ORAL
  Filled 2018-10-21: qty 1

## 2018-10-21 MED ORDER — CHLORHEXIDINE GLUCONATE 4 % EX LIQD: 60.0000 mL | Freq: Once | CUTANEOUS | Status: DC

## 2018-10-21 MED ORDER — MORPHINE SULFATE 4 MG/ML IJ SOLN
INTRAMUSCULAR | Status: DC | PRN
  Administered 2018-10-21: 4 mg via INTRAVENOUS

## 2018-10-21 MED ORDER — SUCCINYLCHOLINE CHLORIDE 20 MG/ML IJ SOLN
INTRAMUSCULAR | Status: DC | PRN
  Administered 2018-10-21: 120 mg via INTRAVENOUS

## 2018-10-21 MED ORDER — MORPHINE SULFATE (PF) 4 MG/ML IV SOLN
INTRAVENOUS | Status: AC
  Filled 2018-10-21: qty 1

## 2018-10-21 MED ORDER — ACETAMINOPHEN 10 MG/ML IV SOLN
INTRAVENOUS | Status: DC | PRN
  Administered 2018-10-21: 1000 mg via INTRAVENOUS

## 2018-10-21 MED ORDER — CELECOXIB 200 MG PO CAPS
ORAL_CAPSULE | ORAL | Status: AC
  Administered 2018-10-21: 15:00:00 400 mg via ORAL
  Filled 2018-10-21: qty 2

## 2018-10-21 MED ORDER — METOCLOPRAMIDE HCL 5 MG/ML IJ SOLN: 5.0000 mg | Freq: Three times a day (TID) | INTRAMUSCULAR | Status: DC | PRN

## 2018-10-21 MED ORDER — FENTANYL CITRATE (PF) 100 MCG/2ML IJ SOLN
INTRAMUSCULAR | Status: DC | PRN
  Administered 2018-10-21 (×4): 50 ug via INTRAVENOUS

## 2018-10-21 MED ORDER — PROPOFOL 10 MG/ML IV BOLUS
INTRAVENOUS | Status: DC | PRN
  Administered 2018-10-21: 200 mg via INTRAVENOUS

## 2018-10-21 MED ORDER — ROCURONIUM BROMIDE 50 MG/5ML IV SOLN
INTRAVENOUS | Status: AC
  Filled 2018-10-21: qty 1

## 2018-10-21 MED ORDER — PROPOFOL 500 MG/50ML IV EMUL
INTRAVENOUS | Status: DC | PRN
  Administered 2018-10-21: 75 ug/kg/min via INTRAVENOUS

## 2018-10-21 MED ORDER — LIDOCAINE HCL (CARDIAC) PF 100 MG/5ML IV SOSY
PREFILLED_SYRINGE | INTRAVENOUS | Status: DC | PRN
  Administered 2018-10-21: 100 mg via INTRAVENOUS

## 2018-10-21 MED ORDER — SODIUM CHLORIDE 0.9 % IV SOLN
INTRAVENOUS | Status: DC
  Administered 2018-10-21: 15:00:00 via INTRAVENOUS

## 2018-10-21 MED ORDER — SUGAMMADEX SODIUM 500 MG/5ML IV SOLN
INTRAVENOUS | Status: AC
  Filled 2018-10-21: qty 5

## 2018-10-21 MED ORDER — PROMETHAZINE HCL 25 MG/ML IJ SOLN: 6.2500 mg | INTRAMUSCULAR | Status: DC | PRN

## 2018-10-21 MED ORDER — ACETAMINOPHEN 10 MG/ML IV SOLN
INTRAVENOUS | Status: AC
  Filled 2018-10-21: qty 100

## 2018-10-21 MED ORDER — ONDANSETRON HCL 4 MG/2ML IJ SOLN
INTRAMUSCULAR | Status: AC
  Filled 2018-10-21: qty 2

## 2018-10-21 MED ORDER — ONDANSETRON HCL 4 MG PO TABS: 4.0000 mg | ORAL_TABLET | Freq: Four times a day (QID) | ORAL | Status: DC | PRN

## 2018-10-21 MED ORDER — BUPIVACAINE-EPINEPHRINE (PF) 0.25% -1:200000 IJ SOLN
INTRAMUSCULAR | Status: AC
  Filled 2018-10-21: qty 30

## 2018-10-21 MED ORDER — METOCLOPRAMIDE HCL 10 MG PO TABS: 5.0000 mg | ORAL_TABLET | Freq: Three times a day (TID) | ORAL | Status: DC | PRN

## 2018-10-21 MED ORDER — DEXAMETHASONE SODIUM PHOSPHATE 10 MG/ML IJ SOLN
INTRAMUSCULAR | Status: DC | PRN
  Administered 2018-10-21: 10 mg via INTRAVENOUS

## 2018-10-21 MED ORDER — MIDAZOLAM HCL 2 MG/2ML IJ SOLN
INTRAMUSCULAR | Status: DC | PRN
  Administered 2018-10-21: 2 mg via INTRAVENOUS

## 2018-10-21 MED ORDER — SUGAMMADEX SODIUM 500 MG/5ML IV SOLN
INTRAVENOUS | Status: DC | PRN
  Administered 2018-10-21: 500 mg via INTRAVENOUS

## 2018-10-21 MED ORDER — PHENYLEPHRINE HCL (PRESSORS) 10 MG/ML IV SOLN
INTRAVENOUS | Status: DC | PRN
  Administered 2018-10-21: 100 ug via INTRAVENOUS

## 2018-10-21 MED ORDER — SCOPOLAMINE 1 MG/3DAYS TD PT72
MEDICATED_PATCH | TRANSDERMAL | Status: AC
  Administered 2018-10-21: 1.5 mg via TRANSDERMAL
  Filled 2018-10-21: qty 1

## 2018-10-21 MED ORDER — DEXAMETHASONE SODIUM PHOSPHATE 10 MG/ML IJ SOLN
INTRAMUSCULAR | Status: AC
  Filled 2018-10-21: qty 1

## 2018-10-21 MED ORDER — PROPOFOL 500 MG/50ML IV EMUL
INTRAVENOUS | Status: AC
  Filled 2018-10-21: qty 100

## 2018-10-21 MED ORDER — PROPOFOL 10 MG/ML IV BOLUS
INTRAVENOUS | Status: AC
  Filled 2018-10-21: qty 20

## 2018-10-21 MED ORDER — SODIUM CHLORIDE FLUSH 0.9 % IV SOLN
INTRAVENOUS | Status: AC
  Filled 2018-10-21: qty 10

## 2018-10-21 MED ORDER — FAMOTIDINE 20 MG PO TABS
20.0000 mg | ORAL_TABLET | Freq: Once | ORAL | Status: AC
  Administered 2018-10-21: 15:00:00 20 mg via ORAL

## 2018-10-21 MED ORDER — ROCURONIUM BROMIDE 100 MG/10ML IV SOLN
INTRAVENOUS | Status: DC | PRN
  Administered 2018-10-21: 20 mg via INTRAVENOUS
  Administered 2018-10-21: 50 mg via INTRAVENOUS
  Administered 2018-10-21: 20 mg via INTRAVENOUS

## 2018-10-21 MED ORDER — SCOPOLAMINE 1 MG/3DAYS TD PT72
1.0000 | MEDICATED_PATCH | Freq: Once | TRANSDERMAL | Status: DC
  Administered 2018-10-21: 15:00:00 1.5 mg via TRANSDERMAL

## 2018-10-21 MED ORDER — BUPIVACAINE-EPINEPHRINE 0.25% -1:200000 IJ SOLN
INTRAMUSCULAR | Status: DC | PRN
  Administered 2018-10-21: 25 mL
  Administered 2018-10-21: 5 mL

## 2018-10-21 MED ORDER — ONDANSETRON HCL 4 MG/2ML IJ SOLN
INTRAMUSCULAR | Status: DC | PRN
  Administered 2018-10-21: 4 mg via INTRAVENOUS

## 2018-10-21 SURGICAL SUPPLY — 28 items
BLADE SHAVER 4.5 DBL SERAT CV (CUTTER) IMPLANT
COVER LIGHT HANDLE STERIS (MISCELLANEOUS) ×3 IMPLANT
COVER WAND RF STERILE (DRAPES) ×3 IMPLANT
CUFF TOURN SGL QUICK 24 (TOURNIQUET CUFF)
CUFF TOURN SGL QUICK 30 (TOURNIQUET CUFF) ×2
CUFF TRNQT CYL 24X4X16.5-23 (TOURNIQUET CUFF) IMPLANT
CUFF TRNQT CYL 30X4X21-28X (TOURNIQUET CUFF) ×1 IMPLANT
DRSG DERMACEA 8X12 NADH (GAUZE/BANDAGES/DRESSINGS) ×3 IMPLANT
DURAPREP 26ML APPLICATOR (WOUND CARE) ×6 IMPLANT
GAUZE SPONGE 4X4 12PLY STRL (GAUZE/BANDAGES/DRESSINGS) ×3 IMPLANT
GLOVE BIOGEL M STRL SZ7.5 (GLOVE) ×3 IMPLANT
GLOVE INDICATOR 8.0 STRL GRN (GLOVE) ×3 IMPLANT
GOWN STRL REUS W/ TWL LRG LVL3 (GOWN DISPOSABLE) ×3 IMPLANT
GOWN STRL REUS W/TWL LRG LVL3 (GOWN DISPOSABLE) ×6
IV LACTATED RINGER IRRG 3000ML (IV SOLUTION) ×12
IV LR IRRIG 3000ML ARTHROMATIC (IV SOLUTION) ×6 IMPLANT
KIT TURNOVER KIT A (KITS) ×3 IMPLANT
MANIFOLD NEPTUNE II (INSTRUMENTS) ×3 IMPLANT
PACK ARTHROSCOPY KNEE (MISCELLANEOUS) ×3 IMPLANT
SET TUBE SUCT SHAVER OUTFL 24K (TUBING) ×3 IMPLANT
SET TUBE TIP INTRA-ARTICULAR (MISCELLANEOUS) ×3 IMPLANT
SOL PREP PROV IODINE SCRUB 4OZ (MISCELLANEOUS) ×3 IMPLANT
STOCKINETTE M/LG 89821 (MISCELLANEOUS) ×3 IMPLANT
SUT ETHILON 3-0 FS-10 30 BLK (SUTURE) ×3
SUTURE EHLN 3-0 FS-10 30 BLK (SUTURE) ×1 IMPLANT
TUBING ARTHRO INFLOW-ONLY STRL (TUBING) ×3 IMPLANT
WAND HAND CNTRL MULTIVAC 50 (MISCELLANEOUS) ×3 IMPLANT
WRAP KNEE W/COLD PACKS 25.5X14 (SOFTGOODS) ×3 IMPLANT

## 2018-10-21 NOTE — Anesthesia Postprocedure Evaluation (Signed)
Anesthesia Post Note  Patient: Leslie Morris  Procedure(s) Performed: ARTHROSCOPY KNEE,  (Right Knee) CHONDROPLASTY (Right Knee) PARTIAL LATERAL MENISECTOMY (Right Knee)  Patient location during evaluation: PACU Anesthesia Type: General Level of consciousness: awake and alert Pain management: pain level controlled Vital Signs Assessment: post-procedure vital signs reviewed and stable Respiratory status: spontaneous breathing, nonlabored ventilation, respiratory function stable and patient connected to nasal cannula oxygen Cardiovascular status: blood pressure returned to baseline and stable Postop Assessment: no apparent nausea or vomiting Anesthetic complications: no     Last Vitals:  Vitals:   10/21/18 1919 10/21/18 1950  BP: 130/89 (!) 136/93  Pulse:  65  Resp:  16  Temp:    SpO2:  96%    Last Pain:  Vitals:   10/21/18 1950  TempSrc:   PainSc: 0-No pain                 Martha Clan

## 2018-10-21 NOTE — Discharge Instructions (Signed)
AMBULATORY SURGERY  °DISCHARGE INSTRUCTIONS ° ° °1) The drugs that you were given will stay in your system until tomorrow so for the next 24 hours you should not: ° °A) Drive an automobile °B) Make any legal decisions °C) Drink any alcoholic beverage ° ° °2) You may resume regular meals tomorrow.  Today it is better to start with liquids and gradually work up to solid foods. ° °You may eat anything you prefer, but it is better to start with liquids, then soup and crackers, and gradually work up to solid foods. ° ° °3) Please notify your doctor immediately if you have any unusual bleeding, trouble breathing, redness and pain at the surgery site, drainage, fever, or pain not relieved by medication. °4)  ° °5) Your post-operative visit with Dr.                     °           °     is: Date:                        Time:   ° °Please call to schedule your post-operative visit. ° °6) Additional Instructions: ° ° ° ° ° ° ° °Instructions after Knee Arthroscopy  ° ° James P. Hooten, Jr., M.D.    ° Dept. of Orthopaedics & Sports Medicine ° [facility] ° [address] ° [place], [place]  [address] ° ° Phone: [phone]   Fax: [phone] ° ° °DIET: °• Drink plenty of non-alcoholic fluids & begin a light diet. °• Resume your normal diet the day after surgery. ° °ACTIVITY:  °• You may use crutches or a walker with weight-bearing as tolerated, unless instructed otherwise. °• You may wean yourself off of the walker or crutches as tolerated.  °• Begin doing gentle exercises. Exercising will reduce the pain and swelling, increase motion, and prevent muscle weakness.   °• Avoid strenuous activities or athletics for a minimum of 4-6 weeks after arthroscopic surgery. °• Do not drive or operate any equipment until instructed. ° °WOUND CARE:  °• Place one to two pillows under the knee the first day or two when sitting or lying.  °• Continue to use the ice packs periodically to reduce pain and swelling. °• The small incisions in  your knee are closed with nylon stitches. The stitches will be removed in the office. °• The bulky dressing may be removed on the second day after surgery. DO NOT TOUCH THE STITCHES. Put a Band-Aid over each stitch. Do NOT use any ointments or creams on the incisions.  °• You may bathe or shower after the stitches are removed at the first office visit following surgery. ° °MEDICATIONS: °• You may resume your regular medications. °• Please take the pain medication as prescribed. °• Do not take pain medication on an empty stomach. °• Do not drive or drink alcoholic beverages when taking pain medications. ° °CALL THE OFFICE FOR: °• Temperature above 101 degrees °• Excessive bleeding or drainage on the dressing. °• Excessive swelling, coldness, or paleness of the toes. °• Persistent nausea and vomiting. ° °FOLLOW-UP:  °• You should have an appointment to return to the office in 7-10 days after surgery.  °  °

## 2018-10-21 NOTE — Anesthesia Post-op Follow-up Note (Signed)
Anesthesia QCDR form completed.        

## 2018-10-21 NOTE — H&P (Signed)
The patient has been re-examined, and the chart reviewed, and there have been no interval changes to the documented history and physical.    The risks, benefits, and alternatives have been discussed at length. The patient expressed understanding of the risks benefits and agreed with plans for surgical intervention.  Dreon Pineda P. Donnae Michels, Jr. M.D.    

## 2018-10-21 NOTE — Anesthesia Preprocedure Evaluation (Signed)
Anesthesia Evaluation  Patient identified by MRN, date of birth, ID band Patient awake    Reviewed: Allergy & Precautions, H&P , NPO status , Patient's Chart, lab work & pertinent test results, reviewed documented beta blocker date and time   History of Anesthesia Complications (+) PONV, Family history of anesthesia reaction and history of anesthetic complications  Airway Mallampati: II  TM Distance: >3 FB Neck ROM: full    Dental  (+) Dental Advidsory Given, Teeth Intact   Pulmonary neg pulmonary ROS, former smoker,    Pulmonary exam normal        Cardiovascular Exercise Tolerance: Good negative cardio ROS Normal cardiovascular exam     Neuro/Psych PSYCHIATRIC DISORDERS Anxiety negative neurological ROS     GI/Hepatic negative GI ROS, Neg liver ROS,   Endo/Other  diabetes, Oral Hypoglycemic AgentsMorbid obesity  Renal/GU Renal disease  negative genitourinary   Musculoskeletal   Abdominal   Peds  Hematology negative hematology ROS (+)   Anesthesia Other Findings Past Medical History: No date: Anxiety No date: Arthritis     Comment:  left ankle, wrist, hand No date: BMI 50.0-59.9, adult (HCC) No date: Carpal tunnel syndrome on left No date: Family history of adverse reaction to anesthesia     Comment:  Dad is slow to wake up and n/v No date: History of kidney stones No date: Left ankle tendonitis No date: Medullary sponge kidney     Comment:  bilateral No date: PONV (postoperative nausea and vomiting)     Comment:  severe No date: Right knee meniscal tear No date: Type 2 diabetes mellitus ([place])     Comment:  followed by pcp--- last A1c 6.8 on 09-25-2016 No date: Urinary incontinence     Comment:  due to Medullary sponge kidney No date: Vitamin D deficiency   Reproductive/Obstetrics negative OB ROS                             Anesthesia Physical Anesthesia Plan  ASA:  III  Anesthesia Plan: General   Post-op Pain Management:    Induction: Intravenous  PONV Risk Score and Plan: 4 or greater and Propofol infusion, TIVA, Midazolam, Promethazine, Treatment may vary due to age or medical condition, Ondansetron and Dexamethasone  Airway Management Planned: Oral ETT  Additional Equipment:   Intra-op Plan:   Post-operative Plan: Extubation in OR  Informed Consent: I have reviewed the patients History and Physical, chart, labs and discussed the procedure including the risks, benefits and alternatives for the proposed anesthesia with the patient or authorized representative who has indicated his/her understanding and acceptance.     Dental Advisory Given  Plan Discussed with: Anesthesiologist, CRNA and Surgeon  Anesthesia Plan Comments:         Anesthesia Quick Evaluation

## 2018-10-21 NOTE — Anesthesia Procedure Notes (Signed)
Procedure Name: Intubation Date/Time: 10/21/2018 4:35 PM Performed by: Johnna Acosta, CRNA Pre-anesthesia Checklist: Patient identified, Emergency Drugs available, Suction available, Patient being monitored and Timeout performed Patient Re-evaluated:Patient Re-evaluated prior to induction Oxygen Delivery Method: Circle system utilized Preoxygenation: Pre-oxygenation with 100% oxygen Induction Type: IV induction Ventilation: Mask ventilation without difficulty Laryngoscope Size: Miller and 2 Grade View: Grade I Tube type: Oral Tube size: 7.5 mm Number of attempts: 1 Airway Equipment and Method: Stylet Placement Confirmation: ETT inserted through vocal cords under direct vision,  positive ETCO2 and breath sounds checked- equal and bilateral Secured at: 21 cm Tube secured with: Tape Dental Injury: Teeth and Oropharynx as per pre-operative assessment  Difficulty Due To: Difficulty was anticipated

## 2018-10-21 NOTE — Transfer of Care (Signed)
Immediate Anesthesia Transfer of Care Note  Patient: Leslie Morris  Procedure(s) Performed: ARTHROSCOPY KNEE (Right Knee)  Patient Location: PACU  Anesthesia Type:General  Level of Consciousness: awake, alert  and oriented  Airway & Oxygen Therapy: Patient Spontanous Breathing and Patient connected to face mask oxygen  Post-op Assessment: Report given to RN, Post -op Vital signs reviewed and stable and Patient moving all extremities X 4  Post vital signs: Reviewed and stable  Last Vitals:  Vitals Value Taken Time  BP    Temp    Pulse 74 10/21/18 1825  Resp    SpO2 98 % 10/21/18 1825  Vitals shown include unvalidated device data.  Last Pain:  Vitals:   10/21/18 1440  TempSrc: Temporal  PainSc: 4          Complications: No apparent anesthesia complications

## 2018-10-21 NOTE — Op Note (Signed)
OPERATIVE NOTE  DATE OF SURGERY:  10/21/2018  PATIENT NAME:  Leslie Morris   DOB: [birth year]  MRN: [id number]   PRE-OPERATIVE DIAGNOSIS:  Internal derangement of the right knee   POST-OPERATIVE DIAGNOSIS:   Tear of the lateral meniscus, right knee Grade III chondromalacia of the medial, lateral, and patellofemoral compartments, right knee  PROCEDURE:  Right knee arthroscopy, partial lateral meniscectomy, and chondroplasty  SURGEON:  Marciano Sequin., M.D.   ASSISTANT: none  ANESTHESIA: general  ESTIMATED BLOOD LOSS: Minimal  FLUIDS REPLACED: 800 mL of crystalloid  TOURNIQUET TIME: Not used  INDICATIONS FOR SURGERY: Leslie Morris is a 48 y.o. year old female who has been seen for complaints of right knee pain. MRI demonstrated findings consistent with meniscal pathology. After discussion of the risks and benefits of surgical intervention, the patient expressed understanding of the risks benefits and agree with plans for right knee arthroscopy.   PROCEDURE IN DETAIL: The patient was brought into the operating room and, after adequate general anesthesia was achieved, a tourniquet was applied to the right thigh and the leg was placed in the leg holder. All bony prominences were well padded. The patient's right knee was cleaned and prepped with alcohol and Duraprep and draped in the usual sterile fashion. A "timeout" was performed as per usual protocol. The anticipated portal sites were injected with 0.25% Marcaine with epinephrine. An anterolateral incision was made and a cannula was inserted. A moderate effusion was evacuated and the knee was distended with fluid using the pump. The scope was advanced down the medial gutter into the medial compartment. Under visualization with the scope, an anteromedial portal was created and a hooked probe was inserted. The medial meniscus was visualized and probed.  The medial meniscus was intact without evidence of tear or instability.  The articular  cartilage was visualized.  There were grade 3 changes of chondromalacia involving the medial tibial plateau.  These areas were debrided and contoured using the 50 degree ArthroCare wand.  The scope was then advanced into the intercondylar notch. The anterior cruciate ligament was visualized and probed and felt to be intact. The scope was removed from the lateral portal and reinserted via the anteromedial portal to better visualize the lateral compartment. The lateral meniscus was visualized and probed.  There was a complex tear involving the anterior horn of the lateral meniscus with a flap type lesion noted anteriorly.  There was continuation of the complex tear involving the posterior horn as well.  The tears were debrided using meniscal punches and a 4.5 mm incisor shaver.  Additional contouring was performed using the 50 degree ArthroCare wand.  The remaining rim of meniscus was visualized and probed and felt to be stable.  The articular cartilage of the lateral compartment was visualized.  There were grade 3 changes of chondromalacia involving primarily the lateral tibial plateau.  These areas were debrided and contoured using the ArthroCare wand.  Finally, the scope was advanced so as to visualize the patellofemoral articulation. Good patellar tracking was appreciated.  There were grade 3 changes of chondromalacia involving the patellofemoral articulation and these areas were debrided and contoured using the ArthroCare wand.  The knee was irrigated with copius amounts of fluid and suctioned dry. The anterolateral portal was re-approximated with #3-0 nylon. A combination of 0.25% Marcaine with epinephrine and 4 mg of Morphine were injected via the scope. The scope was removed and the anteromedial portal was re-approximated with #3-0 nylon. A sterile dressing was  applied followed by application of an ice wrap.  The patient tolerated the procedure well and was transported to the PACU in stable  condition.  Leslie Morris., M.D.

## 2018-10-22 ENCOUNTER — Encounter: Payer: Self-pay | Admitting: Orthopedic Surgery

## 2018-11-05 ENCOUNTER — Other Ambulatory Visit: Payer: Self-pay | Admitting: Family Medicine

## 2018-11-05 DIAGNOSIS — E1169 Type 2 diabetes mellitus with other specified complication: Secondary | ICD-10-CM

## 2018-11-05 MED ORDER — ATORVASTATIN CALCIUM 10 MG PO TABS: 10.0000 mg | ORAL_TABLET | Freq: Every day | ORAL | 0 refills | Status: DC

## 2018-11-05 NOTE — Telephone Encounter (Signed)
Patient needs refills on Atorvastatin 10 mg. Sent to [facility] in [place].

## 2018-11-05 NOTE — Telephone Encounter (Signed)
Done

## 2018-12-17 ENCOUNTER — Encounter: Payer: Self-pay | Admitting: Family Medicine

## 2018-12-17 ENCOUNTER — Other Ambulatory Visit: Payer: Self-pay

## 2018-12-17 ENCOUNTER — Ambulatory Visit: Payer: Managed Care, Other (non HMO) | Admitting: Family Medicine

## 2018-12-17 VITALS — BP 144/107 | HR 94 | Temp 97.3°F | Wt 344.0 lb

## 2018-12-17 DIAGNOSIS — F4323 Adjustment disorder with mixed anxiety and depressed mood: Secondary | ICD-10-CM | POA: Diagnosis not present

## 2018-12-17 MED ORDER — CITALOPRAM HYDROBROMIDE 20 MG PO TABS: 20.0000 mg | ORAL_TABLET | Freq: Every day | ORAL | 3 refills | Status: DC

## 2018-12-17 MED ORDER — HYDROXYZINE HCL 10 MG PO TABS: 10.0000 mg | ORAL_TABLET | Freq: Three times a day (TID) | ORAL | 0 refills | Status: DC | PRN

## 2018-12-17 NOTE — Patient Instructions (Signed)
Adjustment Disorder, Adult Adjustment disorder is a group of symptoms that can develop after a stressful life event, such as the loss of a job or serious physical illness. The symptoms can affect how you feel, think, and act. They may interfere with your relationships. Adjustment disorder increases your risk of suicide and substance abuse. If this disorder is not managed early, it can develop into a more serious condition, such as major depressive disorder or post-traumatic stress disorder. What are the causes? This condition happens when you have trouble recovering from or coping with a stressful life event. What increases the risk? You are more likely to develop this condition if:  You have had depression or anxiety.  You are being treated for a long-term (chronic) illness.  You are being treated for an illness that cannot be cured (terminal illness).  You have a family history of mental illness. What are the signs or symptoms? Symptoms of this condition include:  Extreme trouble doing daily tasks, such as going to work.  Sadness, depression, or crying spells.  Worrying a lot.  Loss of enjoyment.  Change in appetite or weight.  Feelings of loss or hopelessness.  Thoughts of suicide.  Anxiety, worry, or nervousness.  Trouble sleeping.  Avoiding family and friends.  Fighting or vandalism.  Complaining of feeling sick without being ill.  Feeling dazed or disconnected.  Nightmares.  Trouble sleeping.  Irritability.  Reckless driving.  Poor work performance.  Ignoring bills. Symptoms of this condition start within three months of the stressful event. They do not last more than six months, unless the stressful circumstances last longer. Normal grieving after the death of a loved one is not a symptom of this condition. How is this diagnosed? To diagnose this condition, your health care provider will ask about what has happened in your life and how it has affected  you. He or she may also ask about your medical history and your use of medicines, alcohol, and other substances. Your health care provider may do a physical exam and order lab tests or other studies. You may be referred to a mental health specialist. How is this treated? Treatment options for this condition include:  Counseling or talk therapy. Talk therapy is usually provided by mental health specialists.  Medicines. Certain medicines may help with depression, anxiety, and sleep.  Support groups. These offer emotional support, advice, and guidance. They are made up of people who have had similar experiences.  Observation and time. This is sometimes called "watchful waiting." In this treatment, health care providers monitor your health and behavior without other treatment. Adjustment disorder sometimes gets better on its own with time. Follow these instructions at home:  Take over-the-counter and prescription medicines only as told by your health care provider.  Keep all follow-up visits as told by your health care provider. This is important. Contact a health care provider if:  Your symptoms do not improve in six months.  Your symptoms get worse. Get help right away if:  You have serious thoughts about hurting yourself or someone else. If you ever feel like you may hurt yourself or others, or have thoughts about taking your own life, get help right away. You can go to your nearest emergency department or call:  Your local emergency services (911 in the [place]  A suicide crisis helpline, such as the National Suicide Prevention Lifeline at 1-800-273-8255. This is open 24 hours a day. Summary  Adjustment disorder is a group of symptoms that can develop   after a stressful life event, such as the loss of a job or serious physical illness. The symptoms can affect how you feel, think, and act. They may interfere with your relationships.  Symptoms of this condition start within three months  of the stressful event. They do not last more than six months, unless the stressful circumstances last longer.  Treatment may include talk therapy, medicines, participation in a support group, or observation to see if symptoms improve.  Contact your health care provider if your symptoms get worse or do not improve in six months.  If you ever feel like you may hurt yourself or others, or have thoughts about taking your own life, get help right away. This information is not intended to replace advice given to you by your health care provider. Make sure you discuss any questions you have with your health care provider. Document Released: 10/02/2005 Document Revised: 01/10/2017 Document Reviewed: 03/29/2016 Elsevier Patient Education  2020 [employer].

## 2018-12-17 NOTE — Assessment & Plan Note (Signed)
Related to recent stressful situation with boyfriend cheating on her She is seeking therapy, which I encouraged further Will Start Celexa 20 mg daily Discussed potential side effects, incl GI upset, sexual dysfunction, increased anxiety, and SI Discussed that this is more weight neutral than Paxil which she took in the past Discussed that it can take 6-8 weeks to reach full efficacy She can take hydroxyzine as needed for any panic attack symptoms as she has a history of panic attacks We discussed that this is less habit-forming/addictive than lorazepam or benzos Contracted for safety - no SI/HI Discussed synergistic effects of medications and therapy  Follow-up in 6 weeks and repeat PHQ-9 and GAD-7 Consider dose titration of Celexa at follow-up visit

## 2018-12-17 NOTE — Progress Notes (Signed)
Patient: Leslie Morris Female    DOB: [birth year]   48 y.o.   MRN: [id number] Visit Date: 12/17/2018  Today's Provider: Lavon Paganini, MD   Chief Complaint  Patient presents with  . Depression   Subjective:     Depression        This is a new problem.  The problem has been gradually worsening since onset.  Associated symptoms include decreased concentration, fatigue, insomnia, restlessness, decreased interest and appetite change.  Associated symptoms include no headaches and no suicidal ideas.     The symptoms are aggravated by family issues.  Past medical history includes anxiety.   Anxiety Presents for initial visit. The problem has been gradually worsening. Symptoms include decreased concentration, depressed mood, excessive worry, insomnia, nervous/anxious behavior, panic and restlessness. Patient reports no chest pain, dizziness, nausea, palpitations or suicidal ideas. The quality of sleep is poor. Nighttime awakenings: several.    s/p Knee arthroscopy 10/21/2018. Tried to go to work yesterday nad borke down in tears and had to leave. Was originally going to be out of work until 9/16, but asked to go back early as she thought this would be helpful ot get her mind off of it.    Struggled with anxiety 20 years ago with panic attacks. She took Paxil and Ativan at that time.  Took for several years and she gained a lot of weight.  She is worried about weight gain and hesitant to take any medications for this at this time  Having nightmares and difficulty sleeping, difficulty concentrating, crying spells.  This started 3 wks ago after finding out that her boyfriend of 13 years was having an affair with her daughter while still with her.  She feels very betrayed and even embarrassed.  She is called a therapist and has a scheduled appointment in about 3 weeks.   Depression screen [facility] 2/9 12/17/2018 03/13/2018 02/27/2018 02/12/2018  Decreased Interest 3 0 0 0  Down, Depressed, Hopeless  3 0 0 0  PHQ - 2 Score 6 0 0 0  Altered sleeping 3 - - -  Tired, decreased energy 2 - - -  Change in appetite 2 - - -  Feeling bad or failure about yourself  3 - - -  Trouble concentrating 2 - - -  Moving slowly or fidgety/restless 1 - - -  Suicidal thoughts 0 - - -  PHQ-9 Score 19 - - -  Difficult doing work/chores Extremely dIfficult - - -     GAD 7 : Generalized Anxiety Score 12/17/2018  Nervous, Anxious, on Edge 2  Control/stop worrying 3  Worry too much - different things 3  Trouble relaxing 3  Restless 2  Easily annoyed or irritable 1  Afraid - awful might happen 2  Total GAD 7 Score 16  Anxiety Difficulty Extremely difficult      Allergies  Allergen Reactions  . Levaquin [Levofloxacin In D5w] Anaphylaxis and Rash     Current Outpatient Medications:  .  aspirin EC 81 MG tablet, Take 81 mg by mouth daily., Disp: , Rfl:  .  atorvastatin (LIPITOR) 10 MG tablet, Take 1 tablet (10 mg total) by mouth daily., Disp: 90 tablet, Rfl: 0 .  cyclobenzaprine (FLEXERIL) 10 MG tablet, Take 1 tablet (10 mg total) by mouth 2 (two) times daily as needed for muscle spasms., Disp: 30 tablet, Rfl: 0 .  empagliflozin (JARDIANCE) 10 MG TABS tablet, Take 10 mg by mouth daily., Disp: 90 tablet,  Rfl: 1 .  metFORMIN (GLUCOPHAGE) 1000 MG tablet, Take 1 tablet (1,000 mg total) by mouth 2 (two) times daily with a meal., Disp: 180 tablet, Rfl: 1 .  OneTouch Delica Lancets 99991111 MISC, USE AS DIRECTED 3 TIMES A DAY, Disp: 100 each, Rfl: 1 .  ONETOUCH VERIO test strip, USE AS DIRECTED, Disp: 100 strip, Rfl: 1 .  oxybutynin (DITROPAN XL) 15 MG 24 hr tablet, Take 1 tablet (15 mg total) by mouth at bedtime., Disp: 30 tablet, Rfl: 12 .  citalopram (CELEXA) 20 MG tablet, Take 1 tablet (20 mg total) by mouth daily., Disp: 30 tablet, Rfl: 3 .  HYDROcodone-acetaminophen (NORCO) 5-325 MG tablet, Take 1-2 tablets by mouth every 4 (four) hours as needed for moderate pain., Disp: 20 tablet, Rfl: 0 .  hydrOXYzine  (ATARAX/VISTARIL) 10 MG tablet, Take 1 tablet (10 mg total) by mouth 3 (three) times daily as needed for anxiety., Disp: 30 tablet, Rfl: 0  Review of Systems  Constitutional: Positive for appetite change and fatigue. Negative for activity change, chills, diaphoresis, fever and unexpected weight change.  Respiratory: Negative.   Cardiovascular: Positive for leg swelling. Negative for chest pain and palpitations.  Gastrointestinal: Positive for diarrhea. Negative for abdominal distention, abdominal pain, anal bleeding, blood in stool, constipation, nausea, rectal pain and vomiting.  Neurological: Negative for dizziness, light-headedness and headaches.  Hematological: Does not bruise/bleed easily.  Psychiatric/Behavioral: Positive for decreased concentration, depression and sleep disturbance. Negative for dysphoric mood, hallucinations, self-injury and suicidal ideas. The patient is nervous/anxious and has insomnia.     Social History   Tobacco Use  . Smoking status: Current Every Day Smoker    Years: 15.00    Types: Cigarettes  . Smokeless tobacco: Never Used  Substance Use Topics  . Alcohol use: No    Alcohol/week: 0.0 standard drinks      Objective:   BP (!) 144/107 (BP Location: Left Arm, Patient Position: Sitting, Cuff Size: Large)   Pulse 94   Temp (!) 97.3 F (36.3 C) (Temporal)   Wt (!) 344 lb (156 kg)   BMI 53.88 kg/m  Vitals:   12/17/18 1116  BP: (!) 144/107  Pulse: 94  Temp: (!) 97.3 F (36.3 C)  TempSrc: Temporal  Weight: (!) 344 lb (156 kg)  Body mass index is 53.88 kg/m.   Physical Exam Vitals signs reviewed.  Constitutional:      General: She is not in acute distress.    Appearance: Normal appearance. She is well-developed.  HENT:     Head: Normocephalic and atraumatic.  Eyes:     General: No scleral icterus.    Conjunctiva/sclera: Conjunctivae normal.  Cardiovascular:     Rate and Rhythm: Normal rate and regular rhythm.  Pulmonary:     Effort:  Pulmonary effort is normal. No respiratory distress.  Skin:    General: Skin is warm and dry.     Findings: No rash.  Neurological:     Mental Status: She is alert and oriented to person, place, and time. Mental status is at baseline.     Gait: Gait normal.  Psychiatric:        Mood and Affect: Mood is anxious and depressed. Affect is tearful.        Speech: Speech normal.        Behavior: Behavior is cooperative.        Thought Content: Thought content does not include homicidal or suicidal ideation. Thought content does not include homicidal or suicidal plan.  No results found for any visits on 12/17/18.     Assessment & Plan   Problem List Items Addressed This Visit      Other   Adjustment disorder with mixed anxiety and depressed mood - Primary    Related to recent stressful situation with boyfriend cheating on her She is seeking therapy, which I encouraged further Will Start Celexa 20 mg daily Discussed potential side effects, incl GI upset, sexual dysfunction, increased anxiety, and SI Discussed that this is more weight neutral than Paxil which she took in the past Discussed that it can take 6-8 weeks to reach full efficacy She can take hydroxyzine as needed for any panic attack symptoms as she has a history of panic attacks We discussed that this is less habit-forming/addictive than lorazepam or benzos Contracted for safety - no SI/HI Discussed synergistic effects of medications and therapy  Follow-up in 6 weeks and repeat PHQ-9 and GAD-7 Consider dose titration of Celexa at follow-up visit          Return in about 6 weeks (around 01/28/2019) for Adjustment disorder f/u.   The entirety of the information documented in the History of Present Illness, Review of Systems and Physical Exam were personally obtained by me. Portions of this information were initially documented by Ashley Royalty, CMA and reviewed by me for thoroughness and accuracy.    Lakina Mcintire,  Dionne Bucy, MD MPH [facility]

## 2018-12-18 ENCOUNTER — Other Ambulatory Visit: Payer: Self-pay | Admitting: Family Medicine

## 2018-12-18 DIAGNOSIS — E119 Type 2 diabetes mellitus without complications: Secondary | ICD-10-CM

## 2019-01-09 ENCOUNTER — Other Ambulatory Visit: Payer: Self-pay | Admitting: Family Medicine

## 2019-01-09 NOTE — Telephone Encounter (Signed)
Requested medication (s) are due for refill today: yes  Requested medication (s) are on the active medication list: yes  Last refill:  12/17/2018  Future visit scheduled: yes  Notes to clinic:  Requesting a 90 day with 2 refills   Requested Prescriptions  Pending Prescriptions Disp Refills   citalopram (CELEXA) 20 MG tablet [Pharmacy Med Name: CITALOPRAM HBR 20 MG TABLET] 90 tablet 2    Sig: TAKE 1 Francis     Psychiatry:  Antidepressants - SSRI Failed - 01/09/2019  9:31 AM      Failed - Completed PHQ-2 or PHQ-9 in the last 360 days.      Passed - Valid encounter within last 6 months    Recent Outpatient Visits          3 weeks ago Adjustment disorder with mixed anxiety and depressed mood   [facility] [place], Dionne Bucy, MD   3 months ago Acute otitis externa of right ear, unspecified type   [facility] Birdie Sons, MD   4 months ago Pre-op examination   [facility] F[place] Malling M, [place]   5 months ago Acute right-sided low back pain without sciatica   [place], [place], [place]   7 months ago Diabetes mellitus without complication [facility])   [place], [place]      Future Appointments            In 2 weeks Bacigalupo, Dionne Bucy, MD [facility], [place]

## 2019-01-11 NOTE — Telephone Encounter (Signed)
Pharmacy requesting 90 day supply.  Please advise

## 2019-01-12 ENCOUNTER — Other Ambulatory Visit: Payer: Self-pay | Admitting: Physician Assistant

## 2019-01-12 ENCOUNTER — Other Ambulatory Visit: Payer: Self-pay | Admitting: Family Medicine

## 2019-01-12 DIAGNOSIS — M545 Low back pain, unspecified: Secondary | ICD-10-CM

## 2019-01-12 DIAGNOSIS — N393 Stress incontinence (female) (male): Secondary | ICD-10-CM

## 2019-01-12 DIAGNOSIS — Z87440 Personal history of urinary (tract) infections: Secondary | ICD-10-CM

## 2019-01-12 NOTE — Telephone Encounter (Signed)
[facility] faxed refill request for the following medications:  oxybutynin (DITROPAN XL) 15 MG 24 hr tablet  Please advise.

## 2019-01-12 NOTE — Telephone Encounter (Signed)
Requested medication (s) are due for refill today: yes  Requested medication (s) are on the active medication list: yes  Last refill:  08/11/2018  Future visit scheduled: yes  Notes to clinic:  Not delegated    Requested Prescriptions  Pending Prescriptions Disp Refills   cyclobenzaprine (FLEXERIL) 10 MG tablet [Pharmacy Med Name: CYCLOBENZAPRINE 10 MG TABLET] 30 tablet 0    Sig: TAKE 1 TABLET BY MOUTH TWICE A DAY AS NEEDED FOR MUSCLE SPASMS     Not Delegated - Analgesics:  Muscle Relaxants Failed - 01/12/2019  3:38 PM      Failed - This refill cannot be delegated      Passed - Valid encounter within last 6 months    Recent Outpatient Visits          3 weeks ago Adjustment disorder with mixed anxiety and depressed mood   [facility] [place], Dionne Bucy, MD   3 months ago Acute otitis externa of right ear, unspecified type   [facility] Birdie Sons, MD   4 months ago Pre-op examination   [facility] F[place] Malling M, [place]   5 months ago Acute right-sided low back pain without sciatica   [place], [place], PA-C   7 months ago Diabetes mellitus without complication [facility])   [place], [place], [place]      Future Appointments            In 2 weeks Bacigalupo, Dionne Bucy, MD [facility], [place]

## 2019-01-13 MED ORDER — OXYBUTYNIN CHLORIDE ER 15 MG PO TB24: 15.0000 mg | ORAL_TABLET | Freq: Every day | ORAL | 3 refills | Status: DC

## 2019-01-13 NOTE — Telephone Encounter (Signed)
Please review

## 2019-01-27 ENCOUNTER — Other Ambulatory Visit: Payer: Self-pay | Admitting: Family Medicine

## 2019-01-27 DIAGNOSIS — E1169 Type 2 diabetes mellitus with other specified complication: Secondary | ICD-10-CM

## 2019-01-27 DIAGNOSIS — E785 Hyperlipidemia, unspecified: Secondary | ICD-10-CM

## 2019-01-29 ENCOUNTER — Ambulatory Visit: Payer: Managed Care, Other (non HMO) | Admitting: Family Medicine

## 2019-02-18 NOTE — Progress Notes (Signed)
Patient: Leslie Morris Female    DOB: 1970/02/21   49 y.o.   MRN: [id number] Visit Date: 02/19/2019  Today's Provider: Lavon Paganini, MD   Chief Complaint  Patient presents with  . Depression   Subjective:    I [place] S. Dimas, CMA, am acting as scribe for Lavon Paganini, MD. Virtual Visit via Telephone Note  I connected with [name] on 02/19/19 at  2:40 PM EST by telephone and verified that I am speaking with the correct person using two identifiers.  Location:  Patient location: home Provider location: [place] office Persons involved in the visit: patient, provider   I discussed the limitations, risks, security and privacy concerns of performing an evaluation and management service by telephone and the availability of in person appointments. I also discussed with the patient that there may be a patient responsible charge related to this service. The patient expressed understanding and agreed to proceed.   Diarrhea  This is a new problem. The current episode started in the past 7 days. The problem has been unchanged. The stool consistency is described as watery. Associated symptoms include arthralgias and myalgias. Pertinent negatives include no chills, coughing or fever. Nothing aggravates the symptoms. She has tried anti-motility drug and increased fluids for the symptoms. The treatment provided no relief.  symptoms present x4 days.  Has felt very fatigued for past 7-10 days.  Has trouble staying awake at work.  Also feels very thirsty and having dry mouth.  Trying to stay well hydrated.  She does a friend that has been tested for COVID that she has been exposed.  Denies cough, CP, congestion Does have myalgias and SOB that she attributes to the fatigue.    Depression, Follow-up  She  was last seen for this 2 months ago. Changes made at last visit include start Celexa 20mg  daily. Take hydroxyzine as needed for any panic attack.   She  reports good compliance with treatment. She is not having side effects. Diarrhea as above x4d though.  She was having nightmares for the first week or 2.  She reports good tolerance of treatment. Current symptoms include: fatigue, diarrhea She feels she is Improved since last visit.  ------------------------------------------------------------------------ Depression screen [facility] 2/9 02/19/2019 12/17/2018 03/13/2018 02/27/2018 02/12/2018  Decreased Interest 1 3 0 0 0  Down, Depressed, Hopeless 2 3 0 0 0  PHQ - 2 Score 3 6 0 0 0  Altered sleeping 2 3 - - -  Tired, decreased energy 3 2 - - -  Change in appetite 2 2 - - -  Feeling bad or failure about yourself  1 3 - - -  Trouble concentrating 2 2 - - -  Moving slowly or fidgety/restless 1 1 - - -  Suicidal thoughts 0 0 - - -  PHQ-9 Score 14 19 - - -  Difficult doing work/chores Somewhat difficult Extremely dIfficult - - -    GAD 7 : Generalized Anxiety Score 02/19/2019 12/17/2018  Nervous, Anxious, on Edge 1 2  Control/stop worrying 2 3  Worry too much - different things 2 3  Trouble relaxing 1 3  Restless 1 2  Easily annoyed or irritable 1 1  Afraid - awful might happen 2 2  Total GAD 7 Score 10 16  Anxiety Difficulty Very difficult Extremely difficult       Allergies  Allergen Reactions  . Levaquin [Levofloxacin In D5w] Anaphylaxis and Rash     Current Outpatient  Medications:  .  aspirin EC 81 MG tablet, Take 81 mg by mouth daily., Disp: , Rfl:  .  atorvastatin (LIPITOR) 10 MG tablet, TAKE 1 TABLET BY MOUTH EVERY DAY, Disp: 90 tablet, Rfl: 0 .  citalopram (CELEXA) 20 MG tablet, TAKE 1 TABLET BY MOUTH EVERY DAY, Disp: 90 tablet, Rfl: 2 .  cyclobenzaprine (FLEXERIL) 10 MG tablet, TAKE 1 TABLET BY MOUTH TWICE A DAY AS NEEDED FOR MUSCLE SPASMS, Disp: 30 tablet, Rfl: 0 .  empagliflozin (JARDIANCE) 10 MG TABS tablet, Take 10 mg by mouth daily., Disp: 90 tablet, Rfl: 1 .  hydrOXYzine (ATARAX/VISTARIL) 10 MG tablet, TAKE 1 TABLET (10 MG  TOTAL) BY MOUTH 3 (THREE) TIMES DAILY AS NEEDED FOR ANXIETY., Disp: 30 tablet, Rfl: 0 .  metFORMIN (GLUCOPHAGE) 1000 MG tablet, Take 1 tablet (1,000 mg total) by mouth 2 (two) times daily with a meal., Disp: 180 tablet, Rfl: 1 .  OneTouch Delica Lancets 99991111 MISC, USE AS DIRECTED 3 TIMES A DAY, Disp: 100 each, Rfl: 1 .  ONETOUCH VERIO test strip, USE AS DIRECTED, Disp: 100 strip, Rfl: 1 .  oxybutynin (DITROPAN XL) 15 MG 24 hr tablet, Take 1 tablet (15 mg total) by mouth at bedtime., Disp: 30 tablet, Rfl: 3  Review of Systems  Constitutional: Negative for chills and fever.  HENT: Negative for congestion and sore throat.   Eyes: Negative.   Respiratory: Negative for cough, chest tightness and shortness of breath.   Cardiovascular: Negative.   Gastrointestinal: Positive for diarrhea.  Genitourinary: Negative.   Musculoskeletal: Positive for arthralgias and myalgias.  Allergic/Immunologic: Negative.   Neurological: Negative.   Hematological: Negative.   Psychiatric/Behavioral: Negative.     Social History   Tobacco Use  . Smoking status: Current Every Day Smoker    Years: 15.00    Types: Cigarettes  . Smokeless tobacco: Never Used  Substance Use Topics  . Alcohol use: No    Alcohol/week: 0.0 standard drinks      Objective:   There were no vitals taken for this visit. There were no vitals filed for this visit.There is no height or weight on file to calculate BMI.   Physical Exam Speaking in full sentences in no apparent distress.  Depressed mood and flat affect.  No results found for any visits on 02/19/19.     Assessment & Plan    I discussed the assessment and treatment plan with the patient. The patient was provided an opportunity to ask questions and all were answered. The patient agreed with the plan and demonstrated an understanding of the instructions.   The patient was advised to call back or seek an in-person evaluation if the symptoms worsen or if the condition  fails to improve as anticipated.  I provided 35 minutes of non-face-to-face time during this encounter.  Problem List Items Addressed This Visit      Other   Adjustment disorder with mixed anxiety and depressed mood - Primary    Related to recent stressful situation with her boyfriend cheating on her We have talked about the importance of therapy previously She is tolerating Celexa well She is hesitant to stay on this for very long, but we discussed that her depression and anxiety are not well controlled at this time and is not yet time to stop this medication She has had some improvement, but she is not at goal yet, so we will increase her Celexa to 40 mg daily She has not taken hydroxyzine, but we again discussed  that she can take this as needed for any panic attack symptoms as she has a history of panic attacks We discussed that this is not habit-forming/addictive like benzos Contracted for safety-no SI/HI Follow-up in 4 to 6 weeks and repeat PHQ-9 and GAD-7       Other Visit Diagnoses    Viral illness       Diarrhea of presumed infectious origin        -New onset of fatigue, myalgias, arthralgias, shortness of breath, diarrhea -Start of symptoms was 4 days ago -Discussed importance of COVID-19 testing as all the symptoms could be related to a viral illness -Patient agrees to outpatient Covid testing and will schedule her appointment through the portal as we discussed -Discussed self-isolation for at least 10 days from start of symptoms and until fever free for at least 3 days -Work note given today -Return precautions discussed   Return in about 6 weeks (around 04/02/2019) for chronic disease f/u.   The entirety of the information documented in the History of Present Illness, Review of Systems and Physical Exam were personally obtained by me. Portions of this information were initially documented by Lynford Humphrey, CMA and reviewed by me for thoroughness and accuracy.     Quinlee Sciarra, Dionne Bucy, MD MPH [facility]

## 2019-02-19 ENCOUNTER — Ambulatory Visit (INDEPENDENT_AMBULATORY_CARE_PROVIDER_SITE_OTHER): Payer: Managed Care, Other (non HMO) | Admitting: Family Medicine

## 2019-02-19 DIAGNOSIS — F4323 Adjustment disorder with mixed anxiety and depressed mood: Secondary | ICD-10-CM | POA: Diagnosis not present

## 2019-02-19 DIAGNOSIS — B349 Viral infection, unspecified: Secondary | ICD-10-CM

## 2019-02-19 DIAGNOSIS — R197 Diarrhea, unspecified: Secondary | ICD-10-CM | POA: Diagnosis not present

## 2019-02-19 MED ORDER — CITALOPRAM HYDROBROMIDE 40 MG PO TABS: 40.0000 mg | ORAL_TABLET | Freq: Every day | ORAL | 3 refills | Status: DC

## 2019-02-19 NOTE — Assessment & Plan Note (Signed)
Related to recent stressful situation with her boyfriend cheating on her We have talked about the importance of therapy previously She is tolerating Celexa well She is hesitant to stay on this for very long, but we discussed that her depression and anxiety are not well controlled at this time and is not yet time to stop this medication She has had some improvement, but she is not at goal yet, so we will increase her Celexa to 40 mg daily She has not taken hydroxyzine, but we again discussed that she can take this as needed for any panic attack symptoms as she has a history of panic attacks We discussed that this is not habit-forming/addictive like benzos Contracted for safety-no SI/HI Follow-up in 4 to 6 weeks and repeat PHQ-9 and GAD-7

## 2019-02-22 ENCOUNTER — Telehealth: Payer: Self-pay

## 2019-02-22 ENCOUNTER — Ambulatory Visit: Payer: Managed Care, Other (non HMO) | Attending: Internal Medicine

## 2019-02-22 DIAGNOSIS — Z20822 Contact with and (suspected) exposure to covid-19: Secondary | ICD-10-CM

## 2019-02-22 NOTE — Telephone Encounter (Signed)
Sure. Leslie Morris can print the one that I wrote on Friday and fax

## 2019-02-22 NOTE — Telephone Encounter (Signed)
Patient advised as below.  

## 2019-02-22 NOTE — Telephone Encounter (Signed)
Copied from [place] [id number]. Topic: General - Inquiry >> Feb 22, 2019 10:31 AM [name] wrote: Reason for CRM: pt is not able to send the letter (return to work) the dr put for her in [place].  She is requesting you fax the letter to her job Leslie Morris  fax:  [phone]

## 2019-02-22 NOTE — Telephone Encounter (Signed)
Is it okay to fax the work note?  Thanks,   -Mickel Baas

## 2019-02-23 LAB — NOVEL CORONAVIRUS, NAA: SARS-CoV-2, NAA: NOT DETECTED

## 2019-02-24 ENCOUNTER — Ambulatory Visit (INDEPENDENT_AMBULATORY_CARE_PROVIDER_SITE_OTHER): Payer: Managed Care, Other (non HMO) | Admitting: Family Medicine

## 2019-02-24 ENCOUNTER — Encounter: Payer: Self-pay | Admitting: Family Medicine

## 2019-02-24 VITALS — Temp 96.9°F | Ht 67.5 in | Wt 344.0 lb

## 2019-02-24 DIAGNOSIS — Z20822 Contact with and (suspected) exposure to covid-19: Secondary | ICD-10-CM

## 2019-02-24 NOTE — Patient Instructions (Signed)
COVID-19 COVID-19 is a respiratory infection that is caused by a virus called severe acute respiratory syndrome coronavirus 2 (SARS-CoV-2). The disease is also known as coronavirus disease or novel coronavirus. In some people, the virus may not cause any symptoms. In others, it may cause a serious infection. The infection can get worse quickly and can lead to complications, such as:  Pneumonia, or infection of the lungs.  Acute respiratory distress syndrome or ARDS. This is a condition in which fluid build-up in the lungs prevents the lungs from filling with air and passing oxygen into the blood.  Acute respiratory failure. This is a condition in which there is not enough oxygen passing from the lungs to the body or when carbon dioxide is not passing from the lungs out of the body.  Sepsis or septic shock. This is a serious bodily reaction to an infection.  Blood clotting problems.  Secondary infections due to bacteria or fungus.  Organ failure. This is when your body's organs stop working. The virus that causes COVID-19 is contagious. This means that it can spread from person to person through droplets from coughs and sneezes (respiratory secretions). What are the causes? This illness is caused by a virus. You may catch the virus by:  Breathing in droplets from an infected person. Droplets can be spread by a person breathing, speaking, singing, coughing, or sneezing.  Touching something, like a table or a doorknob, that was exposed to the virus (contaminated) and then touching your mouth, nose, or eyes. What increases the risk? Risk for infection You are more likely to be infected with this virus if you:  Are within 6 feet (2 meters) of a person with COVID-19.  Provide care for or live with a person who is infected with COVID-19.  Spend time in crowded indoor spaces or live in shared housing. Risk for serious illness You are more likely to become seriously ill from the virus if you:   Are 50 years of age or older. The higher your age, the more you are at risk for serious illness.  Live in a nursing home or long-term care facility.  Have cancer.  Have a long-term (chronic) disease such as: ? Chronic lung disease, including chronic obstructive pulmonary disease or asthma. ? A long-term disease that lowers your body's ability to fight infection (immunocompromised). ? Heart disease, including heart failure, a condition in which the arteries that lead to the heart become narrow or blocked (coronary artery disease), a disease which makes the heart muscle thick, weak, or stiff (cardiomyopathy). ? Diabetes. ? Chronic kidney disease. ? Sickle cell disease, a condition in which red blood cells have an abnormal "sickle" shape. ? Liver disease.  Are obese. What are the signs or symptoms? Symptoms of this condition can range from mild to severe. Symptoms may appear any time from 2 to 14 days after being exposed to the virus. They include:  A fever or chills.  A cough.  Difficulty breathing.  Headaches, body aches, or muscle aches.  Runny or stuffy (congested) nose.  A sore throat.  New loss of taste or smell. Some people may also have stomach problems, such as nausea, vomiting, or diarrhea. Other people may not have any symptoms of COVID-19. How is this diagnosed? This condition may be diagnosed based on:  Your signs and symptoms, especially if: ? You live in an area with a COVID-19 outbreak. ? You recently traveled to or from an area where the virus is common. ? You   provide care for or live with a person who was diagnosed with COVID-19. ? You were exposed to a person who was diagnosed with COVID-19.  A physical exam.  Lab tests, which may include: ? Taking a sample of fluid from the back of your nose and throat (nasopharyngeal fluid), your nose, or your throat using a swab. ? A sample of mucus from your lungs (sputum). ? Blood tests.  Imaging tests, which  may include, X-rays, CT scan, or ultrasound. How is this treated? At present, there is no medicine to treat COVID-19. Medicines that treat other diseases are being used on a trial basis to see if they are effective against COVID-19. Your health care provider will talk with you about ways to treat your symptoms. For most people, the infection is mild and can be managed at home with rest, fluids, and over-the-counter medicines. Treatment for a serious infection usually takes places in a hospital intensive care unit (ICU). It may include one or more of the following treatments. These treatments are given until your symptoms improve.  Receiving fluids and medicines through an IV.  Supplemental oxygen. Extra oxygen is given through a tube in the nose, a face mask, or a hood.  Positioning you to lie on your stomach (prone position). This makes it easier for oxygen to get into the lungs.  Continuous positive airway pressure (CPAP) or bi-level positive airway pressure (BPAP) machine. This treatment uses mild air pressure to keep the airways open. A tube that is connected to a motor delivers oxygen to the body.  Ventilator. This treatment moves air into and out of the lungs by using a tube that is placed in your windpipe.  Tracheostomy. This is a procedure to create a hole in the neck so that a breathing tube can be inserted.  Extracorporeal membrane oxygenation (ECMO). This procedure gives the lungs a chance to recover by taking over the functions of the heart and lungs. It supplies oxygen to the body and removes carbon dioxide. Follow these instructions at home: Lifestyle  If you are sick, stay home except to get medical care. Your health care provider will tell you how long to stay home. Call your health care provider before you go for medical care.  Rest at home as told by your health care provider.  Do not use any products that contain nicotine or tobacco, such as cigarettes, e-cigarettes, and  chewing tobacco. If you need help quitting, ask your health care provider.  Return to your normal activities as told by your health care provider. Ask your health care provider what activities are safe for you. General instructions  Take over-the-counter and prescription medicines only as told by your health care provider.  Drink enough fluid to keep your urine pale yellow.  Keep all follow-up visits as told by your health care provider. This is important. How is this prevented?  There is no vaccine to help prevent COVID-19 infection. However, there are steps you can take to protect yourself and others from this virus. To protect yourself:   Do not travel to areas where COVID-19 is a risk. The areas where COVID-19 is reported change often. To identify high-risk areas and travel restrictions, check the CDC travel website: wwwnc.cdc.gov/travel/notices  If you live in, or must travel to, an area where COVID-19 is a risk, take precautions to avoid infection. ? Stay away from people who are sick. ? Wash your hands often with soap and water for 20 seconds. If soap and water   are not available, use an alcohol-based hand sanitizer. ? Avoid touching your mouth, face, eyes, or nose. ? Avoid going out in public, follow guidance from your state and local health authorities. ? If you must go out in public, wear a cloth face covering or face mask. Make sure your mask covers your nose and mouth. ? Avoid crowded indoor spaces. Stay at least 6 feet (2 meters) away from others. ? Disinfect objects and surfaces that are frequently touched every day. This may include:  Counters and tables.  Doorknobs and light switches.  Sinks and faucets.  Electronics, such as phones, remote controls, keyboards, computers, and tablets. To protect others: If you have symptoms of COVID-19, take steps to prevent the virus from spreading to others.  If you think you have a COVID-19 infection, contact your health care  provider right away. Tell your health care team that you think you may have a COVID-19 infection.  Stay home. Leave your house only to seek medical care. Do not use public transport.  Do not travel while you are sick.  Wash your hands often with soap and water for 20 seconds. If soap and water are not available, use alcohol-based hand sanitizer.  Stay away from other members of your household. Let healthy household members care for children and pets, if possible. If you have to care for children or pets, wash your hands often and wear a mask. If possible, stay in your own room, separate from others. Use a different bathroom.  Make sure that all people in your household wash their hands well and often.  Cough or sneeze into a tissue or your sleeve or elbow. Do not cough or sneeze into your hand or into the air.  Wear a cloth face covering or face mask. Make sure your mask covers your nose and mouth. Where to find more information  Centers for Disease Control and Prevention: www.cdc.gov/coronavirus/2019-ncov/index.html  World Health Organization: www.who.int/health-topics/coronavirus Contact a health care provider if:  You live in or have traveled to an area where COVID-19 is a risk and you have symptoms of the infection.  You have had contact with someone who has COVID-19 and you have symptoms of the infection. Get help right away if:  You have trouble breathing.  You have pain or pressure in your chest.  You have confusion.  You have bluish lips and fingernails.  You have difficulty waking from sleep.  You have symptoms that get worse. These symptoms may represent a serious problem that is an emergency. Do not wait to see if the symptoms will go away. Get medical help right away. Call your local emergency services (911 in the [place] Do not drive yourself to the hospital. Let the emergency medical personnel know if you think you have COVID-19. Summary  COVID-19 is a  respiratory infection that is caused by a virus. It is also known as coronavirus disease or novel coronavirus. It can cause serious infections, such as pneumonia, acute respiratory distress syndrome, acute respiratory failure, or sepsis.  The virus that causes COVID-19 is contagious. This means that it can spread from person to person through droplets from breathing, speaking, singing, coughing, or sneezing.  You are more likely to develop a serious illness if you are 50 years of age or older, have a weak immune system, live in a nursing home, or have chronic disease.  There is no medicine to treat COVID-19. Your health care provider will talk with you about ways to treat your symptoms.    Take steps to protect yourself and others from infection. Wash your hands often and disinfect objects and surfaces that are frequently touched every day. Stay away from people who are sick and wear a mask if you are sick. This information is not intended to replace advice given to you by your health care provider. Make sure you discuss any questions you have with your health care provider. Document Revised: 11/27/2018 Document Reviewed: 03/05/2018 Elsevier Patient Education  2020 Elsevier Inc.  

## 2019-02-24 NOTE — Progress Notes (Signed)
Patient: Leslie Morris Female    DOB: July 14, 1970   49 y.o.   MRN: [id number] Visit Date: 02/24/2019  Today's Provider: Lavon Paganini, MD   Chief Complaint  Patient presents with  . Cough   Subjective:    I [place] S. Dimas, CMA, am acting as scribe for Lavon Paganini, MD. Virtual Visit via Telephone Note  I connected with Leslie Morris on 02/24/19 at  3:40 PM EST by telephone and verified that I am speaking with the correct person using two identifiers.  Location:  Patient location: home Provider location: [facility] Persons involved in the visit: patient, provider    I discussed the limitations, risks, security and privacy concerns of performing an evaluation and management service by telephone and the availability of in person appointments. I also discussed with the patient that there may be a patient responsible charge related to this service. The patient expressed understanding and agreed to proceed.   HPI  Patient C/O cough, fatigue, headache, myalgias, congestion and shortness of breath for about 8-9 days. Patient reports that her COVID-19 test came back negative. Patient reports she takes advile for symptoms reports no control. Patient denies any fever, ear pain or sore throat. Patient denies any sick contacts. Patient reports her fasting blood sugars are elevated. Patient reports highest number 207. Patient reports taking all prescription medications as directed.   Taste is altered (nothing tastes good). Has some eye drainage when she wakes up Feels like she has run a mile when she just gets up to go to the bathroom.  COVID test was negative on 02/22/19. Feels warm and cold, but no true fever Taking ibuprofen 400mg  prn, but not regularly Has not taken anything else, because she was not sure what she should be taking   Allergies  Allergen Reactions  . Levaquin [Levofloxacin In D5w] Anaphylaxis and Rash     Current Outpatient Medications:   .  aspirin EC 81 MG tablet, Take 81 mg by mouth daily., Disp: , Rfl:  .  atorvastatin (LIPITOR) 10 MG tablet, TAKE 1 TABLET BY MOUTH EVERY DAY, Disp: 90 tablet, Rfl: 0 .  citalopram (CELEXA) 40 MG tablet, Take 1 tablet (40 mg total) by mouth daily., Disp: 30 tablet, Rfl: 3 .  empagliflozin (JARDIANCE) 10 MG TABS tablet, Take 10 mg by mouth daily., Disp: 90 tablet, Rfl: 1 .  metFORMIN (GLUCOPHAGE) 1000 MG tablet, Take 1 tablet (1,000 mg total) by mouth 2 (two) times daily with a meal., Disp: 180 tablet, Rfl: 1 .  OneTouch Delica Lancets [id number] MISC, USE AS DIRECTED 3 TIMES A DAY, Disp: 100 each, Rfl: 1 .  ONETOUCH VERIO test strip, USE AS DIRECTED, Disp: 100 strip, Rfl: 1 .  oxybutynin (DITROPAN XL) 15 MG 24 hr tablet, Take 1 tablet (15 mg total) by mouth at bedtime., Disp: 30 tablet, Rfl: 3 .  hydrOXYzine (ATARAX/VISTARIL) 10 MG tablet, TAKE 1 TABLET (10 MG TOTAL) BY MOUTH 3 (THREE) TIMES DAILY AS NEEDED FOR ANXIETY. (Patient not taking: Reported on 02/24/2019), Disp: 30 tablet, Rfl: 0  Review of Systems  Constitutional: Positive for chills and fatigue.  HENT: Positive for congestion. Negative for ear pain, postnasal drip, rhinorrhea, sinus pressure, sinus pain and sore throat.   Eyes: Positive for discharge. Negative for redness and visual disturbance.  Respiratory: Positive for cough and shortness of breath. Negative for wheezing.   Neurological: Positive for headaches.    Social History   Tobacco Use  .  Smoking status: Current Every Day Smoker    Years: 15.00    Types: Cigarettes  . Smokeless tobacco: Never Used  Substance Use Topics  . Alcohol use: No    Alcohol/week: 0.0 standard drinks      Objective:   Temp (!) 96.9 F (36.1 C) (Oral)   Ht 5' 7.5" (1.715 m)   Wt (!) 344 lb (156 kg)   BMI 53.08 kg/m  Vitals:   02/24/19 1501  Temp: (!) 96.9 F (36.1 C)  TempSrc: Oral  Weight: (!) 344 lb (156 kg)  Height: 5' 7.5" (1.715 m)  Body mass index is 53.08 kg/m.   Physical  Exam Speaking in full sentences with no apparent distress Intermittent coughing  No results found for any visits on 02/24/19.     Assessment & Plan    1. Suspected COVID-19 virus infection -Despite negative Covid test on 02/22/2019, symptoms, including loss of taste, extreme fatigue, shortness of breath, all concerning for COVID-19 infection -Discussed low sensitivity and high false-negative rate with Covid testing with the patient -Discussed to presume that she is positive and self isolate for the recommended 10 days from the start of symptoms and until respiratory symptoms start improving and she is fever free for at least 2 days -As she is not improving and her cough and shortness of breath are worsening, we will send her for chest x-ray to ensure no secondary bacterial pneumonia -Discussed symptomatic management-start Delsym and Mucinex and can increase ibuprofen to 800 mg 3 times daily -Discussed that unless she has signs of secondary bacterial pneumonia, there is no indication for antibiotics at this time -Discussed strict return/ER precautions -New work note given to resume on 03/01/2019 pending clinical improvement - DG Chest 2 View; Future    Return if symptoms worsen or fail to improve.   The entirety of the information documented in the History of Present Illness, Review of Systems and Physical Exam were personally obtained by me. Portions of this information were initially documented by Lynford Humphrey, CMA and reviewed by me for thoroughness and accuracy.    Leslie Morris, Dionne Bucy, MD MPH [facility]

## 2019-02-25 ENCOUNTER — Telehealth: Payer: Self-pay

## 2019-02-25 ENCOUNTER — Ambulatory Visit
Admission: RE | Admit: 2019-02-25 | Discharge: 2019-02-25 | Disposition: A | Discharge status: Home / Self Care | Payer: Managed Care, Other (non HMO) | Attending: Family Medicine | Admitting: Family Medicine

## 2019-02-25 ENCOUNTER — Ambulatory Visit
Admission: RE | Admit: 2019-02-25 | Discharge: 2019-02-25 | Disposition: A | Discharge status: Home / Self Care | Payer: Managed Care, Other (non HMO) | Source: Ambulatory Visit | Attending: Family Medicine | Admitting: Family Medicine

## 2019-02-25 ENCOUNTER — Other Ambulatory Visit: Payer: Self-pay

## 2019-02-25 DIAGNOSIS — R05 Cough: Secondary | ICD-10-CM | POA: Diagnosis present

## 2019-02-25 DIAGNOSIS — R911 Solitary pulmonary nodule: Secondary | ICD-10-CM | POA: Insufficient documentation

## 2019-02-25 DIAGNOSIS — Z20822 Contact with and (suspected) exposure to covid-19: Secondary | ICD-10-CM

## 2019-02-25 DIAGNOSIS — R0602 Shortness of breath: Secondary | ICD-10-CM | POA: Diagnosis not present

## 2019-02-25 NOTE — Telephone Encounter (Signed)
Copied from [place] [id number]. Topic: General - Other >> Feb 25, 2019 10:02 AM Celene Kras wrote: Reason for CRM: Erline Levine, from imaging, called with a call report for the pts chest x ray. She states that the pt has a left lung base nodule, she is recommending a chest ct. Please advise.    [phone]

## 2019-02-25 NOTE — Telephone Encounter (Signed)
Tried calling patient. Left message to call back. OK for [facility] Triage nurse to advise patient, then route message back to office with patients response.

## 2019-02-25 NOTE — Telephone Encounter (Signed)
Called and spoke with the patient and she agreed to have the CT of her chest with contrast. She also stated that that may be an old problem that they saw it 20years ago and took additional images of it and stated that it may be scar tissue from bronchitis. The patient gave verbal understanding of her results and orders.

## 2019-02-25 NOTE — Telephone Encounter (Signed)
-----   Message from Virginia Crews, MD sent at 02/25/2019 10:14 AM EST ----- No pneumonia or edema on chest XRay, but there is a nodule in the medial left lung base.  Recommended to get a CT with contrast to evaluate this further.  If patient agrees, we can get this ordered

## 2019-02-25 NOTE — Telephone Encounter (Signed)
Chest x-ray reviewed 

## 2019-02-25 NOTE — Telephone Encounter (Signed)
Copied from [place] [id number]. Topic: General - Other >> Feb 25, 2019  4:39 PM Antonieta Iba C wrote: Reason for CRM: pt is returning call for x-ray results.

## 2019-02-26 ENCOUNTER — Telehealth: Payer: Self-pay | Admitting: Family Medicine

## 2019-02-26 ENCOUNTER — Encounter: Payer: Self-pay | Admitting: Family Medicine

## 2019-02-26 NOTE — Telephone Encounter (Signed)
Pt has sent a [employer]. Pt is thinking she may have sinus infection. Pt is having headaches, nasal congestion and cough. [facility] [address] in [place]. Pt had telephone visit with dr b on 02-24-2019

## 2019-02-26 NOTE — Telephone Encounter (Signed)
Please call patient back.  Will not respond to Mychart message as I think it's easier to answer back and forth a bit over the phone.  I do believe that she is still in the window of a viral illness.  Sinus congestion and pressure goes along with that.  If symptoms persist into next week and sinus congestion is predominant symptom, especially if it is one-sided, I would consider an antibiotic for possible bacterial sinus infection at that point.  There is no indication for one at this time.  I know that she just wants to feel better.  I did continue Delsym, Mucinex.  She can add a sinus congestion pill, like phenylephrine.  I would also do some nasal rinses and Flonase.  Of course, we are also going to wait on her chest CT to come back to evaluate that consolidation/nodule that was seen in her lung.

## 2019-02-26 NOTE — Telephone Encounter (Signed)
Pt advised.   Thanks,   -Tildon Silveria  

## 2019-03-01 ENCOUNTER — Encounter: Payer: Self-pay | Admitting: Family Medicine

## 2019-03-01 ENCOUNTER — Telehealth: Payer: Self-pay

## 2019-03-01 MED ORDER — AMOXICILLIN-POT CLAVULANATE 875-125 MG PO TABS: 1.0000 | ORAL_TABLET | Freq: Two times a day (BID) | ORAL | 0 refills | Status: DC

## 2019-03-01 NOTE — Telephone Encounter (Signed)
Pt called and stated that she is still not feeling well. Pt states that she feels like everything is still in her head and draining down her throat. Pt states that she would like to know if she can have an antibiotic called in. Also she is to return to work tomorrow 02/28/18 and would like to know if she should do so. If not she will need another note for work. Please advise

## 2019-03-01 NOTE — Telephone Encounter (Signed)
Work note printed. Please fax to employer as we have previously

## 2019-03-01 NOTE — Telephone Encounter (Signed)
Patient advised as below. Patient would like work note to be extended till 03/04/2019.

## 2019-03-01 NOTE — Telephone Encounter (Signed)
Likely still COVID, but ok to give augmentin 875 1 tab BID x7d #14 r0 for possible secondary bacterial sinusitis.  If she feels up to work, she is okay to go ahead in, but if she needs [place] to extend her work note because she continues to feel poorly, I am okay with doing.

## 2019-03-01 NOTE — Telephone Encounter (Signed)
Contacted patient to let her know that she possibly still has covid and about the prescription being sent in. Pt would like to know If she could have her work note extended until Thursday 03/04/19, to see how she feels after taking medication.

## 2019-03-15 ENCOUNTER — Telehealth: Payer: Self-pay

## 2019-03-15 ENCOUNTER — Other Ambulatory Visit: Payer: Self-pay

## 2019-03-15 ENCOUNTER — Ambulatory Visit
Admission: RE | Admit: 2019-03-15 | Discharge: 2019-03-15 | Disposition: A | Discharge status: Home / Self Care | Payer: Managed Care, Other (non HMO) | Source: Ambulatory Visit | Attending: Family Medicine | Admitting: Family Medicine

## 2019-03-15 DIAGNOSIS — R911 Solitary pulmonary nodule: Secondary | ICD-10-CM

## 2019-03-15 LAB — POCT I-STAT CREATININE: Creatinine, Ser: 0.7 mg/dL (ref 0.44–1.00)

## 2019-03-15 MED ORDER — IOHEXOL 300 MG/ML  SOLN
75.0000 mL | Freq: Once | INTRAMUSCULAR | Status: AC | PRN
  Administered 2019-03-15: 75 mL via INTRAVENOUS

## 2019-03-15 NOTE — Telephone Encounter (Signed)
-----   Message from Virginia Crews, MD sent at 03/15/2019  1:28 PM EST ----- Patient does have a lung nodule.  It has been there since 2006, and is only increased slightly in size.  It is thought to likely be benign, but radiologist recommends pulmonologist referral.  If patient is agreeable to this, we can go and place a referral.  She also has fatty liver changes with some questionable nodularity of the liver on the CT scan.  This could be concerning for cirrhosis.  I recommend checking right upper quadrant ultrasound.  This does not have to be urgent, however.  We should also check her liver function test labs at her next visit.

## 2019-03-15 NOTE — Telephone Encounter (Signed)
Pt advised.  She would like to discuss the results at her follow up visit on 04/05/2019  Thanks,   -Mickel Baas

## 2019-03-19 ENCOUNTER — Other Ambulatory Visit: Payer: Self-pay | Admitting: Family Medicine

## 2019-03-19 NOTE — Telephone Encounter (Signed)
Pharmacy is requesting changes to Rx- sent for PCP review of request

## 2019-03-26 ENCOUNTER — Other Ambulatory Visit: Payer: Self-pay | Admitting: Family Medicine

## 2019-04-05 ENCOUNTER — Ambulatory Visit (INDEPENDENT_AMBULATORY_CARE_PROVIDER_SITE_OTHER): Payer: Managed Care, Other (non HMO) | Admitting: Physician Assistant

## 2019-04-05 ENCOUNTER — Encounter: Payer: Self-pay | Admitting: Physician Assistant

## 2019-04-05 ENCOUNTER — Telehealth: Payer: Self-pay

## 2019-04-05 ENCOUNTER — Ambulatory Visit: Payer: Self-pay | Admitting: Family Medicine

## 2019-04-05 ENCOUNTER — Other Ambulatory Visit: Payer: Self-pay

## 2019-04-05 VITALS — BP 142/100 | HR 85 | Temp 96.8°F | Wt 355.0 lb

## 2019-04-05 DIAGNOSIS — Z6841 Body Mass Index (BMI) 40.0 and over, adult: Secondary | ICD-10-CM

## 2019-04-05 DIAGNOSIS — E119 Type 2 diabetes mellitus without complications: Secondary | ICD-10-CM

## 2019-04-05 DIAGNOSIS — E1169 Type 2 diabetes mellitus with other specified complication: Secondary | ICD-10-CM | POA: Diagnosis not present

## 2019-04-05 DIAGNOSIS — D1721 Benign lipomatous neoplasm of skin and subcutaneous tissue of right arm: Secondary | ICD-10-CM

## 2019-04-05 DIAGNOSIS — F4322 Adjustment disorder with anxiety: Secondary | ICD-10-CM

## 2019-04-05 DIAGNOSIS — N393 Stress incontinence (female) (male): Secondary | ICD-10-CM | POA: Diagnosis not present

## 2019-04-05 DIAGNOSIS — E785 Hyperlipidemia, unspecified: Secondary | ICD-10-CM

## 2019-04-05 DIAGNOSIS — L659 Nonscarring hair loss, unspecified: Secondary | ICD-10-CM

## 2019-04-05 DIAGNOSIS — R911 Solitary pulmonary nodule: Secondary | ICD-10-CM

## 2019-04-05 MED ORDER — SOLIFENACIN SUCCINATE 10 MG PO TABS: 10.0000 mg | ORAL_TABLET | Freq: Every day | ORAL | 0 refills | Status: DC

## 2019-04-05 NOTE — Progress Notes (Signed)
Patient: Leslie Morris Female    DOB: 04-27-1970   49 y.o.   MRN: [id number] Visit Date: 04/13/2019  Today's Provider: Mar Daring, PA-C   Chief Complaint  Patient presents with  . Anxiety   Subjective:     Anxiety Presents for follow-up visit. Patient reports no confusion, decreased concentration, depressed mood, dizziness, excessive worry, insomnia, irritability, nervous/anxious behavior, palpitations, panic, restlessness, shortness of breath or suicidal ideas. The quality of sleep is good.    Otalgia  There is pain in the right ear. This is a new problem. The problem occurs constantly. There has been no fever. Pertinent negatives include no abdominal pain, coughing, diarrhea, ear discharge, headaches, hearing loss or sore throat.   Pt reports having "knots" under her skin in several areas. Notices one on her forearm, on the left flank, and has some that have come and gone. None are bothering her at this time or seem infected.   Hair loss. Started a few months ago. Diffuse. Has been under a lot of stress.   Blood sugar running high.  Fasting high 100's. No known reason why. No changes in diet or exercise.    Allergies  Allergen Reactions  . Levaquin [Levofloxacin In D5w] Anaphylaxis and Rash     Current Outpatient Medications:  .  aspirin EC 81 MG tablet, Take 81 mg by mouth daily., Disp: , Rfl:  .  atorvastatin (LIPITOR) 10 MG tablet, TAKE 1 TABLET BY MOUTH EVERY DAY, Disp: 90 tablet, Rfl: 0 .  empagliflozin (JARDIANCE) 10 MG TABS tablet, Take 10 mg by mouth daily., Disp: 90 tablet, Rfl: 1 .  metFORMIN (GLUCOPHAGE) 1000 MG tablet, TAKE 1 TABLET (1,000 MG TOTAL) BY MOUTH 2 (TWO) TIMES DAILY WITH A MEAL., Disp: 60 tablet, Rfl: 0 .  OneTouch Delica Lancets [id number] MISC, USE AS DIRECTED 3 TIMES A DAY, Disp: 100 each, Rfl: 1 .  ONETOUCH VERIO test strip, USE AS DIRECTED, Disp: 100 strip, Rfl: 1 .  solifenacin (VESICARE) 10 MG tablet, Take 1 tablet (10 mg total) by  mouth at bedtime., Disp: 30 tablet, Rfl: 0  Review of Systems  Constitutional: Positive for appetite change (Increased appetite) and fatigue. Negative for activity change, chills, diaphoresis, fever, irritability and unexpected weight change.  HENT: Positive for ear pain, sinus pressure and sinus pain. Negative for congestion, ear discharge, hearing loss, postnasal drip, sneezing, sore throat, tinnitus and voice change.   Respiratory: Negative.  Negative for cough and shortness of breath.   Cardiovascular: Negative.  Negative for palpitations.  Gastrointestinal: Negative.  Negative for abdominal pain and diarrhea.  Neurological: Negative for dizziness, light-headedness and headaches.  Psychiatric/Behavioral: Negative for agitation, behavioral problems, confusion, decreased concentration, dysphoric mood, self-injury, sleep disturbance and suicidal ideas. The patient is not nervous/anxious and does not have insomnia.     Social History   Tobacco Use  . Smoking status: Current Every Day Smoker    Years: 15.00    Types: Cigarettes  . Smokeless tobacco: Never Used  Substance Use Topics  . Alcohol use: No    Alcohol/week: 0.0 standard drinks      Objective:   BP (!) 142/100 (BP Location: Left Wrist, Patient Position: Sitting, Cuff Size: Normal)   Pulse 85   Temp (!) 96.8 F (36 C) (Temporal)   Wt (!) 355 lb (161 kg)   BMI 54.78 kg/m  Vitals:   04/05/19 1628  BP: (!) 142/100  Pulse: 85  Temp: (!) 96.8 F (  36 C)  TempSrc: Temporal  Weight: (!) 355 lb (161 kg)  Body mass index is 54.78 kg/m.   Physical Exam Vitals reviewed.  Constitutional:      General: She is not in acute distress.    Appearance: Normal appearance. She is well-developed. She is obese. She is not ill-appearing or diaphoretic.  Neck:     Thyroid: No thyromegaly.     Vascular: No JVD.     Trachea: No tracheal deviation.  Cardiovascular:     Rate and Rhythm: Normal rate and regular rhythm.     Pulses:  Normal pulses.     Heart sounds: Normal heart sounds. No murmur. No friction rub. No gallop.   Pulmonary:     Effort: Pulmonary effort is normal. No respiratory distress.     Breath sounds: Normal breath sounds. No wheezing or rales.  Musculoskeletal:     Cervical back: Normal range of motion and neck supple.  Lymphadenopathy:     Cervical: No cervical adenopathy.  Skin:    General: Skin is warm and dry.     Comments: sebaceous cyst noted in a few areas, none infected.  Neurological:     Mental Status: She is alert.      No results found for any visits on 04/05/19.     Assessment & Plan    1. Stress incontinence Mentions having increasing stress incontinence with leaking with coughing, sneezing, laughing and increased nocturia. Will start Vesicare as below. Call if side effects. Will re-evaluate in one month to make sure improving. - solifenacin (VESICARE) 10 MG tablet; Take 1 tablet (10 mg total) by mouth at bedtime.  Dispense: 30 tablet; Refill: 0  2. Hyperlipidemia associated with type 2 diabetes mellitus (HCC) Continue atorvastatin 10mg . Will check labs as below and f/u pending results. - CBC w/Diff/Platelet - Comprehensive Metabolic Panel (CMET) - HgB A1c  3. BMI 50.0-59.9, adult ([place]) Counseled patient on healthy lifestyle modifications including dieting and exercise.  - CBC w/Diff/Platelet - Comprehensive Metabolic Panel (CMET) - HgB A1c  4. Diabetes mellitus without complication ([place]) Will check labs as below and f/u pending results. Continue Jardiance 10mg , metformin 1000mg  BID. Return in 3 months.  - CBC w/Diff/Platelet - Comprehensive Metabolic Panel (CMET) - HgB A1c  5. Lipoma of right upper extremity Noted on right forearm area. Not causing problems at this time.   6. Adjustment disorder with anxious mood Stable.   7. Hair loss Will check labs as below to r/o organic source for hair loss. Discussed starting Biotin.  - CBC w/Diff/Platelet -  Comprehensive Metabolic Panel (CMET) - T4 AND TSH - HgB A1c - Fe+TIBC+Fer  8. Lung nodule seen on imaging study Noted on CT scan with slight increase in size over last 14 years. Will get PET scan as below to make sure there is no noted metabolism that may indicate [place] to pursue further workup.  - NM PET Image Initial (PI) Skull Base To Thigh; Future  9. Solitary pulmonary nodule See above medical treatment plan. - NM PET Image Initial (PI) Skull Base To Thigh; Future     Mar Daring, PA-C  [facility]

## 2019-04-05 NOTE — Telephone Encounter (Signed)
Copied from [place] [id number]. Topic: General - Call Back - No Documentation >> Apr 05, 2019  8:46 AM Richardo Priest, NT wrote: Reason for CRM: Patient called in stating she received a call this morning from someone in office in regards to her appointment today. Pt stated message was not clear. Attempted to reach office but unable to do so. Pt has been screened already. Please advise.

## 2019-04-05 NOTE — Telephone Encounter (Signed)
Appt rescheduled

## 2019-04-05 NOTE — Patient Instructions (Addendum)
Go down to 20mg  Citalopram x 1 week. Cut 20mg  in half (10mg ) x 1 week Take 1/2 tab (10mg ) every other day x 1 week  Then stop  Vesicare/Solifenacin tablets What is this medicine? SOLIFENACIN (sol i FEN a cin) is used to treat overactive bladder. This medicine reduces the amount of bathroom visits. It may also help to control wetting accidents. It may be used alone, but sometimes may be given with other treatments. This medicine may be used for other purposes; ask your health care provider or pharmacist if you have questions. COMMON BRAND NAME(S): VESIcare What should I tell my health care provider before I take this medicine? They need to know if you have any of these conditions:  glaucoma  history of irregular heartbeat  kidney disease  liver disease  problems urinating  prostate disease  stomach or intestine problems  an unusual or allergic reaction to solifenacin, other medicines, foods, dyes, or preservatives  pregnant or trying to get pregnant  breast-feeding How should I use this medicine? Take this medicine by mouth with a glass of water. Follow the directions on the prescription label. Swallow the tablets whole. You can take it with or without food. If it upsets your stomach, take it with food. Take your doses at regular intervals. Do not take your medicine more often than directed. Do not stop taking except on the advice of your doctor or health care professional. Talk to your pediatrician regarding the use of this medicine in children. Special care may be needed. Overdosage: If you think you have taken too much of this medicine contact a poison control center or emergency room at once. NOTE: This medicine is only for you. Do not share this medicine with others. What if I miss a dose? If you miss a dose, take it as soon as you can. If it is almost time for your next dose, take only that dose. Do not take double or extra doses. What may interact with this medicine? Do  not take this medicine with any of the following medications:  cisapride  dronedarone  pimozide  thioridazine This medicine may also interact with the following medications:  atropine  certain other medicines for bladder problems like oxybutynin, tolterodine  certain medicines for fungal infections like fluconazole, itraconazole, ketoconazole, posaconazole, voriconazole  certain medicines for travel sickness like scopolamine  other medicines that prolong the QT interval (cause an abnormal heart rhythm) like dofetilide, ziprasidone  rifampin  St. John's Wort This list may not describe all possible interactions. Give your health care provider a list of all the medicines, herbs, non-prescription drugs, or dietary supplements you use. Also tell them if you smoke, drink alcohol, or use illegal drugs. Some items may interact with your medicine. What should I watch for while using this medicine? Visit your doctor or health care professional for regular checks on your progress. You may need to limit your intake tea, coffee, caffeinated sodas, and alcohol. These drinks may make your symptoms worse. You may get drowsy or dizzy. Do not drive, use machinery, or do anything that needs mental alertness until you know how this medicine affects you. Do not stand or sit up quickly, especially if you are an older patient. This reduces the risk of dizzy or fainting spells. Your mouth may get dry. Chewing sugarless gum or sucking hard candy, and drinking plenty of water may help. Contact your doctor if the problem does not go away or is severe. This medicine may cause dry eyes  and blurred vision. If you wear contact lenses, you may feel some discomfort. Lubricating drops may help. See your eye care professional if the problem does not go away or is severe. Avoid extreme heat. This medicine can cause you to sweat less than normal. Your body temperature could increase to dangerous levels, which may lead to  heat stroke. What side effects may I notice from receiving this medicine? Side effects that you should report to your doctor or health care professional as soon as possible:  allergic reactions like skin rash, itching or hives, swelling of the face, lips, or tongue  breathing problems  changes in vision  confusion  eye pain  fast, irregular heartbeat  feeling faint or lightheaded, falls  hallucinations, loss of contact with reality  signs and symptoms of heat stroke such as decreased sweating; high temperature or fever; nausea; unusually weak or tired  trouble passing urine or change in the amount of urine Side effects that usually do not require medical attention (report to your doctor or health care professional if they continue or are bothersome):  constipation  dizziness  drowsiness  headache  stomach upset This list may not describe all possible side effects. Call your doctor for medical advice about side effects. You may report side effects to FDA at 1-800-FDA-1088. Where should I keep my medicine? Keep out of the reach of children. Store at room temperature between 15 and 30 degrees C (59 and 86 degrees F). Throw away any unused medicine after the expiration date. NOTE: This sheet is a summary. It may not cover all possible information. If you have questions about this medicine, talk to your doctor, pharmacist, or health care provider.  2020 Elsevier/Gold Standard (2018-01-20 10:17:17)

## 2019-04-15 ENCOUNTER — Telehealth: Payer: Self-pay

## 2019-04-15 DIAGNOSIS — R911 Solitary pulmonary nodule: Secondary | ICD-10-CM

## 2019-04-15 NOTE — Telephone Encounter (Signed)
Please notify patient PET scan denied. Will need pulmonology referral first. Ok to order if she agrees.

## 2019-04-15 NOTE — Telephone Encounter (Signed)
Patient advised. She agreed to pulmonologist referral. Referral placed.

## 2019-04-15 NOTE — Telephone Encounter (Signed)
Copied from [place] [id number]. Topic: General - Other >> Apr 15, 2019  8:38 AM Parke Poisson wrote: Reason for CRM: Insurance company denied [place]  PET imaging study due to no recent biopsy

## 2019-04-15 NOTE — Addendum Note (Signed)
Addended by: Jules Schick on: 04/15/2019 03:08 PM   Modules accepted: Orders

## 2019-04-22 ENCOUNTER — Other Ambulatory Visit: Payer: Self-pay | Admitting: Family Medicine

## 2019-04-22 ENCOUNTER — Telehealth: Payer: Self-pay

## 2019-04-22 DIAGNOSIS — E1169 Type 2 diabetes mellitus with other specified complication: Secondary | ICD-10-CM

## 2019-04-22 DIAGNOSIS — N393 Stress incontinence (female) (male): Secondary | ICD-10-CM

## 2019-04-22 MED ORDER — MIRABEGRON ER 25 MG PO TB24: 25.0000 mg | ORAL_TABLET | Freq: Every day | ORAL | 0 refills | Status: DC

## 2019-04-22 NOTE — Addendum Note (Signed)
Addended by: Mar Daring on: 04/22/2019 03:45 PM   Modules accepted: Orders

## 2019-04-22 NOTE — Telephone Encounter (Signed)
Please remind patient to come and have labs drawn (see order from last month)

## 2019-04-22 NOTE — Telephone Encounter (Signed)
Will change to myrbetriq to see if that is more effective.

## 2019-04-22 NOTE — Telephone Encounter (Signed)
Called patient back and she reports that the Solifenacin is working for her and she doesn't know if the dosage needs to be increase or change to something else? Or if she needs to go back on Oxybutin with a higher dose.  [facility] [place]

## 2019-04-22 NOTE — Telephone Encounter (Signed)
Copied from [place] [id number]. Topic: General - Other >> Apr 22, 2019  9:54 AM Rainey Pines A wrote: Patient would like a callback from [place] in  regards to her bladder medication. Please advise

## 2019-04-22 NOTE — Telephone Encounter (Signed)
Approved per protocol.  

## 2019-04-22 NOTE — Telephone Encounter (Signed)
Requested  medications are  due for refill today yes  Requested medications are on the active medication list yes  Last refill 12/16  Future visit scheduled na  Notes to clinic office note stated that pt needed labs but do not see where they were drawn.

## 2019-04-23 ENCOUNTER — Other Ambulatory Visit: Payer: Self-pay | Admitting: Family Medicine

## 2019-04-23 DIAGNOSIS — E1165 Type 2 diabetes mellitus with hyperglycemia: Secondary | ICD-10-CM

## 2019-04-23 NOTE — Telephone Encounter (Signed)
Requested medication (s) are due for refill today:yes  Requested medication (s) are on the active medication list: yes  Last refill:  03/26/19  Future visit scheduled: no  Notes to clinic:  last OV  04/05/19 patient did not have her labs done. Orders still pending. Last A1C 09/04/2018.    Requested Prescriptions  Pending Prescriptions Disp Refills   metFORMIN (GLUCOPHAGE) 1000 MG tablet [Pharmacy Med Name: METFORMIN HCL 1,000 MG TABLET] 60 tablet 0    Sig: TAKE 1 TABLET (1,000 MG TOTAL) BY MOUTH 2 (TWO) TIMES DAILY WITH A MEAL.      Endocrinology:  Diabetes - Biguanides Failed - 04/23/2019 12:31 PM      Failed - HBA1C is between 0 and 7.9 and within 180 days    Hemoglobin A1C  Date Value Ref Range Status  09/04/2018 7.1 (A) 4.0 - 5.6 % Final    Comment:    Average 157   Hgb A1c MFr Bld  Date Value Ref Range Status  02/09/2018 10.4 (H) 4.8 - 5.6 % Final    Comment:    (NOTE) Pre diabetes:          5.7%-6.4% Diabetes:              >6.4% Glycemic control for   <7.0% adults with diabetes           Passed - Cr in normal range and within 360 days    Creatinine  Date Value Ref Range Status  09/29/2012 0.68 0.60 - 1.30 mg/dL Final   Creatinine, Ser  Date Value Ref Range Status  03/15/2019 0.70 0.44 - 1.00 mg/dL Final          Passed - eGFR in normal range and within 360 days    EGFR (African American)  Date Value Ref Range Status  09/29/2012 >60  Final   GFR calc Af Amer  Date Value Ref Range Status  09/04/2018 108 >59 mL/min/1.73 Final   EGFR (Non-African Amer.)  Date Value Ref Range Status  09/29/2012 >60  Final    Comment:    eGFR values <22m/min/1.73 m2 may be an indication of chronic kidney disease (CKD). Calculated eGFR is useful in patients with stable renal function. The eGFR calculation will not be reliable in acutely ill patients when serum creatinine is changing rapidly. It is not useful in  patients on dialysis. The eGFR calculation may not be  applicable to patients at the low and high extremes of body sizes, pregnant women, and vegetarians.    GFR calc non Af Amer  Date Value Ref Range Status  09/04/2018 94 >59 mL/min/1.73 Final          Passed - Valid encounter within last 6 months    Recent Outpatient Visits           2 weeks ago Stress incontinence   [facility]  1 month ago [facility] COVID-19 virus [facility]   2 months ago Adjustment disorder with mixed anxiety and depressed mood   [facility]   4 months ago Adjustment disorder with mixed anxiety and depressed mood   [facility]   7 months ago Acute otitis externa of right ear, unspecified type   [facility]

## 2019-04-23 NOTE — Telephone Encounter (Signed)
Patient advised as below.  

## 2019-04-26 ENCOUNTER — Other Ambulatory Visit: Payer: Self-pay | Admitting: Physician Assistant

## 2019-04-26 DIAGNOSIS — N393 Stress incontinence (female) (male): Secondary | ICD-10-CM

## 2019-04-26 MED ORDER — FESOTERODINE FUMARATE ER 8 MG PO TB24: 8.0000 mg | ORAL_TABLET | Freq: Every day | ORAL | 0 refills | Status: DC

## 2019-04-26 NOTE — Progress Notes (Signed)
Myrbetriq not covered. Has to also fail Toviaz ER 8mg . [facility].

## 2019-04-26 NOTE — Progress Notes (Signed)
Patient advised as directed below. 

## 2019-04-28 ENCOUNTER — Other Ambulatory Visit: Payer: Self-pay | Admitting: Physician Assistant

## 2019-04-28 DIAGNOSIS — N393 Stress incontinence (female) (male): Secondary | ICD-10-CM

## 2019-05-03 ENCOUNTER — Telehealth: Payer: Self-pay

## 2019-05-03 DIAGNOSIS — N393 Stress incontinence (female) (male): Secondary | ICD-10-CM

## 2019-05-03 MED ORDER — FESOTERODINE FUMARATE ER 8 MG PO TB24: 8.0000 mg | ORAL_TABLET | Freq: Every day | ORAL | 0 refills | Status: DC

## 2019-05-03 NOTE — Telephone Encounter (Signed)
Copied from [place] #[id number]. Topic: General - Inquiry >> May 03, 2019 10:40 AM Mathis Bud wrote: Reason for CRM: Patient is requesting to speak with PCP nurse regarding bladder medication, Patient states that pharmacy still does not have any medication. Patient has been waiting for new bladder medication for over a week.  Patient states bladder medication she is on now she is urinating constantly.  Patient states its hard to make it to the rest room. She would like a call back asap.  Call back # [phone]

## 2019-05-03 NOTE — Telephone Encounter (Signed)
Patient was advised that the PA for Toviaz still pending still no outcome whether approved or denied will call the costumer service number to check.

## 2019-05-03 NOTE — Telephone Encounter (Signed)
Toviaz 8mg  was sent [facility] [place] on 04/26/19 and resent to [facility] today. Myrbetriq was denied because insurance wanted her to try Toviaz 8mg  also.   Not sure if another PA was required. Josie have you seen anything on the [place]?

## 2019-05-05 ENCOUNTER — Telehealth: Payer: Self-pay | Admitting: Physician Assistant

## 2019-05-05 DIAGNOSIS — N3281 Overactive bladder: Secondary | ICD-10-CM

## 2019-05-05 DIAGNOSIS — N393 Stress incontinence (female) (male): Secondary | ICD-10-CM

## 2019-05-05 MED ORDER — MIRABEGRON ER 25 MG PO TB24: 25.0000 mg | ORAL_TABLET | Freq: Every day | ORAL | 0 refills | Status: DC

## 2019-05-05 NOTE — Telephone Encounter (Signed)
Ok I will send in Myrbetriq and see if we can get that covered now.  Patient has now failed oxybutinin 5 and 10mg  and 10 mg ER, Vesicare 10mg , and toviaz 8mg  (intolerant).

## 2019-05-05 NOTE — Telephone Encounter (Signed)
PA for Myrbetriq sent though Cover My Meds.   Thanks,   -Mickel Baas

## 2019-05-05 NOTE — Telephone Encounter (Signed)
Patient states that she has tried the [place].  It is causing her to have headache and dizziness.  She also has had ankle swelling since being on it.  She said she can't take any more of the Toviaz.   She is aware that this will not be seen until Thursday.  Please advise patient.

## 2019-05-05 NOTE — Telephone Encounter (Signed)
Pt called to speak with the nurse about her fesoterodine (TOVIAZ) 8 MG TB24 tablet Pt would like a call back today / please advise

## 2019-05-07 ENCOUNTER — Telehealth: Payer: Self-pay | Admitting: *Deleted

## 2019-05-07 DIAGNOSIS — N393 Stress incontinence (female) (male): Secondary | ICD-10-CM

## 2019-05-07 MED ORDER — OXYBUTYNIN CHLORIDE 5 MG PO TABS: 5.0000 mg | ORAL_TABLET | Freq: Three times a day (TID) | ORAL | 0 refills | Status: DC

## 2019-05-07 NOTE — Telephone Encounter (Signed)
LMTCB or to send a my chart message. I called the [employer] Prior Authorization spoke with Hassan Rowan and reports that the request went though management and now is for final reviews with the pharmacist and they still have a 72 hours time frame. We will send oxybutynin for a week until we hear from the insurance about the Myrbetriq for you.

## 2019-05-07 NOTE — Telephone Encounter (Signed)
Leslie Minister M "Sharyn Lull" (Patient) Leslie Innocent "Sharyn Lull" (Patient) General - Inquiry  Summary: medication  Reason for CRM: Patient is requesting to speak with PCP nurse regarding bladder medication, Patient states that pharmacy still does not have any medication. Patient has been waiting for new bladder medication for over a week. Patient states bladder medication she is on now she is urinating constantly. Patient states its hard to make it to the rest room. She would like a call back asap.   Call back # [phone]

## 2019-05-07 NOTE — Telephone Encounter (Signed)
Oxybutynin 5mg  to take TID sent in for patient until we see if Myrbetriq gets covered.

## 2019-05-17 NOTE — Telephone Encounter (Signed)
Pt called and asked to speak with Joseline about the medication she has been trying to get / please advise

## 2019-05-17 NOTE — Telephone Encounter (Signed)
This was denied the 30th.

## 2019-05-17 NOTE — Telephone Encounter (Signed)
LMTCB- Myrbetriq was denied.

## 2019-05-18 ENCOUNTER — Telehealth: Payer: Self-pay

## 2019-05-18 ENCOUNTER — Telehealth: Payer: Self-pay | Admitting: Physician Assistant

## 2019-05-18 DIAGNOSIS — N393 Stress incontinence (female) (male): Secondary | ICD-10-CM

## 2019-05-18 MED ORDER — OXYBUTYNIN CHLORIDE 5 MG PO TABS: 5.0000 mg | ORAL_TABLET | Freq: Three times a day (TID) | ORAL | 1 refills | Status: DC

## 2019-05-18 NOTE — Telephone Encounter (Unsigned)
Copied from [place] [id number]. Topic: General - Other >> May 18, 2019  9:08 AM Celene Kras wrote: Reason for CRM: Pt called stating that she is needing the medication for her bladder. Pt states that she only has enough medication for today. Pt is checking to see if medication has been approved by insurance. Please advise.

## 2019-05-18 NOTE — Telephone Encounter (Signed)
Advised patient that medication was denied and sh states that Tawanna Sat was handling the issue for her. Patient would like for message to be send to [facility] and she is aware that Tawanna Sat is out of the office until Thursday.

## 2019-05-18 NOTE — Telephone Encounter (Signed)
Copied from [place] [id number]. Topic: General - Other >> May 18, 2019  9:08 AM Celene Kras wrote: Reason for CRM: Pt called stating that she is needing the medication for her bladder. Pt states that she only has enough medication for today. Pt is checking to see if medication has been approved by insurance. Please advise.     [id number] [place] [place] [facility] [address] [place] [address] Phone: [phone] Fax: [phone] Not a 24 hour pharmacy; exact hours not known.

## 2019-05-18 NOTE — Addendum Note (Signed)
Addended by: Mar Daring on: 05/18/2019 11:32 AM   Modules accepted: Orders

## 2019-05-18 NOTE — Telephone Encounter (Signed)
Has she tried doing the oxybutynin IR to take throughout the day instead of the once daily?  I have sent this in for her since Myrbetriq was denied.

## 2019-05-19 NOTE — Telephone Encounter (Signed)
Patient advised as below. Patient verbalizes understanding and is in agreement with treatment plan.  

## 2019-05-26 ENCOUNTER — Telehealth: Payer: Self-pay

## 2019-05-26 DIAGNOSIS — N393 Stress incontinence (female) (male): Secondary | ICD-10-CM

## 2019-05-26 NOTE — Telephone Encounter (Signed)
Tawanna Sat, In your absence I attempted to assist this patient.  She is very frustrated over the problems with her OAB medications.  Apparently the insurance company was denying the new Rx, then another was sent in and rejected. From what I understand she then went back on the original medication but was only given 1 weeks worth and is our and needs more.  Her frustration was then exacerbated by talking to several different people in the office and she expressed that one was very rude to her.  That is why she was asking for Josie.  I explained to her I was trying to help her out but I understand the reason for her frustration. I assured her that I would get this message to you and when you came in on tomorrow I would follow up to make sure it was addressed.  If you don't mind could you please return it to me since I did make her this promise.  Thanks.     Copied from [place] [phone]. Topic: General - Other >> May 26, 2019  9:21 AM Oneta Rack wrote: Reason for CRM:   Patient requesting to speak with "Josie" stating its regarding a medication, patient did not want to elaborate and states she would know. Please advise

## 2019-05-27 MED ORDER — OXYBUTYNIN CHLORIDE 5 MG PO TABS: 5.0000 mg | ORAL_TABLET | Freq: Three times a day (TID) | ORAL | 5 refills | Status: DC

## 2019-05-27 NOTE — Telephone Encounter (Signed)
I had sent in the oxybutynin 5mg  TID on 05/18/19 to [facility]. I will resend.  Other option would be referral to urology for this issue as other medications have not been covered by insurance.

## 2019-05-27 NOTE — Addendum Note (Signed)
Addended by: Mar Daring on: 05/27/2019 01:33 PM   Modules accepted: Orders

## 2019-05-27 NOTE — Telephone Encounter (Signed)
Patient advised and expressed her gratitude

## 2019-07-12 ENCOUNTER — Other Ambulatory Visit: Payer: Self-pay | Admitting: Family Medicine

## 2019-08-10 ENCOUNTER — Other Ambulatory Visit: Payer: Self-pay | Admitting: Family Medicine

## 2019-08-10 DIAGNOSIS — E119 Type 2 diabetes mellitus without complications: Secondary | ICD-10-CM

## 2019-08-10 NOTE — Telephone Encounter (Signed)
Requested Prescriptions  Pending Prescriptions Disp Refills  . ONETOUCH VERIO test strip [employer] Med Name: ONE TOUCH VERIO TEST STRIP] 100 strip 1    Sig: USE AS DIRECTED     Endocrinology: Diabetes - Testing Supplies Passed - 08/10/2019  2:58 AM      Passed - Valid encounter within last 12 months    Recent Outpatient Visits          4 months ago Stress incontinence   [place], PA-C   5 months ago Suspected COVID-19 virus infection   [facility], Dionne Bucy, MD   5 months ago Adjustment disorder with mixed anxiety and depressed mood   [facility] [place], Dionne Bucy, MD   7 months ago Adjustment disorder with mixed anxiety and depressed mood   [facility], Dionne Bucy, MD   10 months ago Acute otitis externa of right ear, unspecified type   [facility] Birdie Sons, MD

## 2019-10-06 ENCOUNTER — Other Ambulatory Visit: Payer: Self-pay | Admitting: Physician Assistant

## 2019-10-06 NOTE — Telephone Encounter (Signed)
Requested medications are due for refill today? Yes  Requested medications are on active medication list?  Yes  Last Refill:  07/13/2019  # 90 with no refills   Future visit scheduled?  No  Notes to Clinic:  Medication failed RX refill protocol due to labs not within established time frames.  Last LDL was 04/14/2018.  Last HGB A1C / GFR was 09/04/2018.

## 2019-10-12 ENCOUNTER — Telehealth: Payer: Self-pay

## 2019-10-12 MED ORDER — EMPAGLIFLOZIN 10 MG PO TABS: 10.0000 mg | ORAL_TABLET | Freq: Every day | ORAL | 0 refills | Status: DC

## 2019-10-12 NOTE — Telephone Encounter (Signed)
Copied from [place] [id number]. Topic: General - Other >> Oct 12, 2019  9:04 AM Yvette Rack wrote: Reason for CRM: Pt requests a new Rx for empagliflozin (JARDIANCE) 10 MG TABS tablet. Pt stated the pharmacy only offered a 30 day supply but it was the same price that she pays for a 90 day supply.

## 2019-10-16 ENCOUNTER — Other Ambulatory Visit: Payer: Self-pay | Admitting: Family Medicine

## 2019-10-16 ENCOUNTER — Other Ambulatory Visit: Payer: Self-pay | Admitting: Physician Assistant

## 2019-10-16 DIAGNOSIS — E1165 Type 2 diabetes mellitus with hyperglycemia: Secondary | ICD-10-CM

## 2019-10-16 DIAGNOSIS — E785 Hyperlipidemia, unspecified: Secondary | ICD-10-CM

## 2019-10-16 NOTE — Telephone Encounter (Signed)
Requested Prescriptions  Pending Prescriptions Disp Refills   metFORMIN (GLUCOPHAGE) 1000 MG tablet [Pharmacy Med Name: METFORMIN HCL 1,000 MG TABLET] 180 tablet 0    Sig: TAKE 1 TABLET (1,000 MG TOTAL) BY MOUTH 2 (TWO) TIMES DAILY WITH A MEAL.     Endocrinology:  Diabetes - Biguanides Failed - 10/16/2019  8:51 AM      Failed - HBA1C is between 0 and 7.9 and within 180 days    Hemoglobin A1C  Date Value Ref Range Status  09/04/2018 7.1 (A) 4.0 - 5.6 % Final    Comment:    Average 157   Hgb A1c MFr Bld  Date Value Ref Range Status  02/09/2018 10.4 (H) 4.8 - 5.6 % Final    Comment:    (NOTE) Pre diabetes:          5.7%-6.4% Diabetes:              >6.4% Glycemic control for   <7.0% adults with diabetes          Failed - eGFR in normal range and within 360 days    EGFR (African American)  Date Value Ref Range Status  09/29/2012 >60  Final   GFR calc Af Amer  Date Value Ref Range Status  09/04/2018 108 >59 mL/min/1.73 Final   EGFR (Non-African Amer.)  Date Value Ref Range Status  09/29/2012 >60  Final    Comment:    eGFR values <14mL/min/1.73 m2 may be an indication of chronic kidney disease (CKD). Calculated eGFR is useful in patients with stable renal function. The eGFR calculation will not be reliable in acutely ill patients when serum creatinine is changing rapidly. It is not useful in  patients on dialysis. The eGFR calculation may not be applicable to patients at the low and high extremes of body sizes, pregnant women, and vegetarians.    GFR calc non Af Amer  Date Value Ref Range Status  09/04/2018 94 >59 mL/min/1.73 Final         Failed - Valid encounter within last 6 months    Recent Outpatient Visits          6 months ago Stress incontinence   [place], [place], PA-C   7 months ago Suspected COVID-19 virus infection   [facility], Dionne Bucy, MD   7 months ago Adjustment disorder with mixed anxiety  and depressed mood   [facility], Dionne Bucy, MD   10 months ago Adjustment disorder with mixed anxiety and depressed mood   [facility], Dionne Bucy, MD   1 year ago Acute otitis externa of right ear, unspecified type   [facility] Birdie Sons, MD      Future Appointments            In 1 week [place], Clearnce Sorrel, PA-C [facility]           Passed - Cr in normal range and within 360 days    Creatinine  Date Value Ref Range Status  09/29/2012 0.68 0.60 - 1.30 mg/dL Final   Creatinine, Ser  Date Value Ref Range Status  03/15/2019 0.70 0.44 - 1.00 mg/dL Final

## 2019-10-16 NOTE — Telephone Encounter (Signed)
Requested Prescriptions  Pending Prescriptions Disp Refills  . atorvastatin (LIPITOR) 10 MG tablet [Pharmacy Med Name: ATORVASTATIN 10 MG TABLET] 30 tablet 0    Sig: TAKE 1 TABLET BY MOUTH EVERY DAY     Cardiovascular:  Antilipid - Statins Failed - 10/16/2019  8:51 AM      Failed - Total Cholesterol in normal range and within 360 days    Cholesterol, Total  Date Value Ref Range Status  04/14/2018 212 (H) 100 - 199 mg/dL Final         Failed - LDL in normal range and within 360 days    LDL Calculated  Date Value Ref Range Status  04/14/2018 142 (H) 0 - 99 mg/dL Final         Failed - HDL in normal range and within 360 days    HDL  Date Value Ref Range Status  04/14/2018 36 (L) >39 mg/dL Final         Failed - Triglycerides in normal range and within 360 days    Triglycerides  Date Value Ref Range Status  04/14/2018 170 (H) 0 - 149 mg/dL Final         Passed - Patient is not pregnant      Passed - Valid encounter within last 12 months    Recent Outpatient Visits          6 months ago Stress incontinence   [place], [place], PA-C   7 months ago Suspected COVID-19 virus infection   [facility] [place], Dionne Bucy, MD   7 months ago Adjustment disorder with mixed anxiety and depressed mood   [facility] [place], Dionne Bucy, MD   10 months ago Adjustment disorder with mixed anxiety and depressed mood   [facility] [place], Dionne Bucy, MD   1 year ago Acute otitis externa of right ear, unspecified type   [facility] Birdie Sons, MD      Future Appointments            In 1 week [place], Clearnce Sorrel, PA-C [facility] [place]

## 2019-10-21 ENCOUNTER — Other Ambulatory Visit: Payer: Self-pay

## 2019-10-21 ENCOUNTER — Ambulatory Visit: Payer: Managed Care, Other (non HMO) | Admitting: Physician Assistant

## 2019-10-21 ENCOUNTER — Encounter: Payer: Self-pay | Admitting: Physician Assistant

## 2019-10-21 VITALS — BP 132/82 | HR 78 | Temp 98.3°F | Wt 334.0 lb

## 2019-10-21 DIAGNOSIS — J029 Acute pharyngitis, unspecified: Secondary | ICD-10-CM | POA: Diagnosis not present

## 2019-10-21 DIAGNOSIS — H9202 Otalgia, left ear: Secondary | ICD-10-CM | POA: Diagnosis not present

## 2019-10-21 NOTE — Progress Notes (Signed)
Established patient visit   Patient: Leslie Morris   DOB: 1970/04/19   49 y.o. Female  MRN: [id number] Visit Date: 10/21/2019  Today's healthcare provider: Trinna Post, PA-C   Chief Complaint  Patient presents with  . Ear Pain   Subjective    Otalgia  There is pain in the left ear. This is a new problem. The current episode started yesterday. The problem occurs constantly. The problem has been gradually worsening. There has been no fever. Associated symptoms include headaches. Pertinent negatives include no coughing or ear discharge. She has tried NSAIDs for the symptoms. The treatment provided mild relief.        Medications: Outpatient Medications Prior to Visit  Medication Sig  . aspirin EC 81 MG tablet Take 81 mg by mouth daily.  Marland Kitchen atorvastatin (LIPITOR) 10 MG tablet TAKE 1 TABLET BY MOUTH EVERY DAY  . empagliflozin (JARDIANCE) 10 MG TABS tablet Take 1 tablet (10 mg total) by mouth daily. Please schedule an office visit before anymore refills.  . metFORMIN (GLUCOPHAGE) 1000 MG tablet TAKE 1 TABLET (1,000 MG TOTAL) BY MOUTH 2 (TWO) TIMES DAILY WITH A MEAL.  Glory Rosebush Delica Lancets 31D MISC USE AS DIRECTED 3 TIMES A DAY  . ONETOUCH VERIO test strip USE AS DIRECTED  . oxybutynin (DITROPAN) 5 MG tablet Take 1 tablet (5 mg total) by mouth 3 (three) times daily.  . fesoterodine (TOVIAZ) 8 MG TB24 tablet Take 1 tablet (8 mg total) by mouth daily. (Patient not taking: Reported on 10/21/2019)   No facility-administered medications prior to visit.    Review of Systems  Constitutional: Negative for fatigue and fever.  HENT: Positive for congestion, ear pain, postnasal drip, sinus pressure and sinus pain. Negative for ear discharge.   Respiratory: Negative for cough, shortness of breath and wheezing.   Musculoskeletal: Positive for myalgias.  Neurological: Positive for headaches.      Objective    BP 132/82   Pulse 78   Temp 98.3 F (36.8 C)   Wt (!) 334 lb (151.5  kg)   BMI 51.54 kg/m    Physical Exam Constitutional:      Appearance: Normal appearance.  HENT:     Right Ear: Tympanic membrane and ear canal normal.     Left Ear: Tympanic membrane and ear canal normal.  Cardiovascular:     Rate and Rhythm: Normal rate and regular rhythm.     Heart sounds: Normal heart sounds.  Pulmonary:     Effort: Pulmonary effort is normal.  Skin:    General: Skin is warm and dry.  Neurological:     Mental Status: She is alert.  Psychiatric:        Mood and Affect: Mood normal.        Behavior: Behavior normal.       Results for orders placed or performed in visit on 10/21/19  Novel Coronavirus, NAA ([facility])   Specimen: Nasopharyngeal(NP) swabs in vial transport medium   Nasopharynge  Is this  Result Value Ref Range   SARS-CoV-2, NAA Not Detected Not Detected  SARS-COV-2, NAA 2 DAY TAT   Nasopharynge  Is this  Result Value Ref Range   SARS-CoV-2, NAA 2 DAY TAT Performed     Assessment & Plan    1. Left ear pain  - symptoms and exam c/w viral URI  - no evidence of strep pharyngitis, CAP, AOM, bacterial sinusitis, or other bacterial infection - concern for possible COVID19 infection -  will send for outpatient testing - discussed need to quarantine 10 days from start of symptoms and until fever-free for at least 48 hours - discussed need to quarantine household members - discussed symptomatic management, natural course, and return precautions -delsym, tylenol and ibuprofen for sx relief.    2. Sore throat  - Novel Coronavirus, NAA ([facility])   Return if symptoms worsen or fail to improve.      ITrinna Post, PA-C, have reviewed all documentation for this visit. The documentation on 10/26/19 for the exam, diagnosis, procedures, and orders are all accurate and complete.  The entirety of the information documented in the History of Present Illness, Review of Systems and Physical Exam were personally obtained by me. Portions of this  information were initially documented by Wilburt Finlay, CMA and reviewed by me for thoroughness and accuracy.     Paulene Floor  [facility] [phone] (phone) [phone] (fax)  [place]

## 2019-10-21 NOTE — Patient Instructions (Signed)

## 2019-10-22 ENCOUNTER — Telehealth: Payer: Self-pay

## 2019-10-22 NOTE — Telephone Encounter (Signed)
Please advise, I dont see patient on schedule today for covid test at office. KW

## 2019-10-22 NOTE — Telephone Encounter (Signed)
scheduled

## 2019-10-22 NOTE — Telephone Encounter (Signed)
She was probably tested yesterday as she saw Adriana yesterday.   I can send the work note through her mychart.

## 2019-10-22 NOTE — Telephone Encounter (Signed)
Copied from [place] [id number]. Topic: General - Inquiry >> Oct 21, 2019  3:21 PM Greggory Keen D wrote: Reason for CRM: Pt called asking if she can get a note for work until she gets her lab results back from her covid test.  She said she is supposed to come back to the office at 4 pm today in the parking lot and get a test.  She ask if she could get the note at that time.  CB#  [phone]

## 2019-10-23 LAB — NOVEL CORONAVIRUS, NAA: SARS-CoV-2, NAA: NOT DETECTED

## 2019-10-23 LAB — SARS-COV-2, NAA 2 DAY TAT

## 2019-10-26 NOTE — Progress Notes (Signed)
MyChart Video Visit    Virtual Visit via Video Note   This visit type was conducted due to national recommendations for restrictions regarding the COVID-19 Pandemic (e.g. social distancing) in an effort to limit this patient's exposure and mitigate transmission in our community. This patient is at least at moderate risk for complications without adequate follow up. This format is felt to be most appropriate for this patient at this time. Physical exam was limited by quality of the video and audio technology used for the visit.   Patient location: Home Provider location: [facility]  I discussed the limitations of evaluation and management by telemedicine and the availability of in person appointments. The patient expressed understanding and agreed to proceed.  Patient: Leslie Morris   DOB: 1970/05/21   49 y.o. Female  MRN: [id number] Visit Date: 10/27/2019  Today's healthcare provider: Mar Daring, PA-C   No chief complaint on file.  Subjective    URI  This is a new (Patient was seen in office 10/21/19 for ear pain that started on 10/20/19.) problem. The current episode started in the past 7 days. The problem has been gradually worsening. There has been no fever. Associated symptoms include congestion, coughing (mild), ear pain (left), headaches, a plugged ear sensation, rhinorrhea and sinus pain. Pertinent negatives include no abdominal pain, chest pain, diarrhea, nausea, neck pain, rash, sneezing, sore throat (scratchy throat), swollen glands, vomiting or wheezing. She has tried sleep, NSAIDs and decongestant for the symptoms. The treatment provided no relief.    Patient has had symptoms since 9/7-9/8. Was seen via mychart video visit on 9/9 and was tested for covid 19, which was negative. Patient has been using Advil cold and sinus without improvement or relief.    Patient Active Problem List   Diagnosis Date Noted  . Hyperlipidemia associated with type 2 diabetes mellitus ([place])  04/05/2019  . Adjustment disorder with mixed anxiety and depressed mood 12/17/2018  . BMI 50.0-59.9, adult ([place]) 01/04/2018  . Stress incontinence 12/04/2017  . History of recurrent UTI (urinary tract infection) 12/04/2017  . Diabetes mellitus without complication ([place]) 87/56/4332  . Carpal tunnel syndrome of left wrist 03/07/2017  . Right knee pain 06/30/2015   Past Medical History:  Diagnosis Date  . Anxiety   . Arthritis    left ankle, wrist, hand  . BMI 50.0-59.9, adult ([place])   . Carpal tunnel syndrome on left   . Family history of adverse reaction to anesthesia    Dad is slow to wake up and n/v  . History of kidney stones   . Left ankle tendonitis   . Medullary sponge kidney    bilateral  . PONV (postoperative nausea and vomiting)    severe  . Right knee meniscal tear   . Type 2 diabetes mellitus ([place])    followed by pcp--- last A1c 6.8 on 09-25-2016  . Urinary incontinence    due to Medullary sponge kidney  . Vitamin D deficiency       Medications: Outpatient Medications Prior to Visit  Medication Sig  . aspirin EC 81 MG tablet Take 81 mg by mouth daily.  Marland Kitchen atorvastatin (LIPITOR) 10 MG tablet TAKE 1 TABLET BY MOUTH EVERY DAY  . empagliflozin (JARDIANCE) 10 MG TABS tablet Take 1 tablet (10 mg total) by mouth daily. Please schedule an office visit before anymore refills.  . fesoterodine (TOVIAZ) 8 MG TB24 tablet Take 1 tablet (8 mg total) by mouth daily. (Patient not taking: Reported on  10/21/2019)  . metFORMIN (GLUCOPHAGE) 1000 MG tablet TAKE 1 TABLET (1,000 MG TOTAL) BY MOUTH 2 (TWO) TIMES DAILY WITH A MEAL.  Glory Rosebush Delica Lancets 62I MISC USE AS DIRECTED 3 TIMES A DAY  . ONETOUCH VERIO test strip USE AS DIRECTED  . oxybutynin (DITROPAN) 5 MG tablet Take 1 tablet (5 mg total) by mouth 3 (three) times daily.   No facility-administered medications prior to visit.    Review of Systems  Constitutional: Positive for fatigue. Negative for fever.  HENT: Positive for  congestion, ear pain (left), rhinorrhea and sinus pain. Negative for sneezing and sore throat (scratchy throat).   Respiratory: Positive for cough (mild). Negative for chest tightness and wheezing.   Cardiovascular: Negative for chest pain.  Gastrointestinal: Negative for abdominal pain, diarrhea, nausea and vomiting.  Musculoskeletal: Negative for neck pain.  Skin: Negative for rash.  Neurological: Positive for dizziness (yesterday) and headaches.    Last CBC Lab Results  Component Value Date   WBC 11.0 (H) 09/04/2018   HGB 12.7 09/04/2018   HCT 40.0 09/04/2018   MCV 82 09/04/2018   MCH 25.9 (L) 09/04/2018   RDW 15.1 09/04/2018   PLT 283 94/85/4627   Last metabolic panel Lab Results  Component Value Date   GLUCOSE 99 09/04/2018   NA 139 09/04/2018   K 4.2 09/04/2018   CL 102 09/04/2018   CO2 20 09/04/2018   BUN 17 09/04/2018   CREATININE 0.70 03/15/2019   GFRNONAA 94 09/04/2018   GFRAA 108 09/04/2018   CALCIUM 9.1 09/04/2018   PHOS 3.0 08/02/2016   PROT 7.0 09/04/2018   ALBUMIN 4.3 09/04/2018   LABGLOB 2.7 09/04/2018   AGRATIO 1.6 09/04/2018   BILITOT 0.4 09/04/2018   ALKPHOS 65 09/04/2018   AST 12 09/04/2018   ALT 16 09/04/2018   ANIONGAP 13 02/09/2018      Objective    There were no vitals taken for this visit. BP Readings from Last 3 Encounters:  10/21/19 132/82  04/05/19 (!) 142/100  12/17/18 (!) 144/107   Wt Readings from Last 3 Encounters:  10/21/19 (!) 334 lb (151.5 kg)  04/05/19 (!) 355 lb (161 kg)  02/24/19 (!) 344 lb (156 kg)      Physical Exam Vitals reviewed.  Constitutional:      General: She is not in acute distress.    Appearance: Normal appearance. She is well-developed. She is ill-appearing.  HENT:     Head: Normocephalic and atraumatic.  Pulmonary:     Effort: Pulmonary effort is normal. No respiratory distress.  Musculoskeletal:     Cervical back: Normal range of motion and neck supple.  Neurological:     Mental Status: She  is alert.  Psychiatric:        Mood and Affect: Mood normal.        Behavior: Behavior normal.        Thought Content: Thought content normal.        Judgment: Judgment normal.        Assessment & Plan     1. Acute non-recurrent pansinusitis Worsening symptoms that have not responded to OTC medications. Will give augmentin as below. Continue allergy medications. Stay well hydrated and get plenty of rest. Call if no symptom improvement or if symptoms worsen. - amoxicillin-clavulanate (AUGMENTIN) 875-125 MG tablet; Take 1 tablet by mouth 2 (two) times daily.  Dispense: 20 tablet; Refill: 0  2. Non-recurrent acute suppurative otitis media of left ear without spontaneous rupture of tympanic membrane  See above medical treatment plan. - amoxicillin-clavulanate (AUGMENTIN) 875-125 MG tablet; Take 1 tablet by mouth 2 (two) times daily.  Dispense: 20 tablet; Refill: 0  3. Antibiotic-induced yeast infection Gets yeast infections with antibiotics. Diflucan given as below.  - fluconazole (DIFLUCAN) 150 MG tablet; Take 1 tablet (150 mg total) by mouth once for 1 dose. May repeat in 48-72 hrs if needed  Dispense: 2 tablet; Refill: 0   No follow-ups on file.     I discussed the assessment and treatment plan with the patient. The patient was provided an opportunity to ask questions and all were answered. The patient agreed with the plan and demonstrated an understanding of the instructions.   The patient was advised to call back or seek an in-person evaluation if the symptoms worsen or if the condition fails to improve as anticipated.  I provided 14 minutes of non-face-to-face time during this encounter.  Reynolds Bowl, PA-C, have reviewed all documentation for this visit. The documentation on 10/27/19 for the exam, diagnosis, procedures, and orders are all accurate and complete.  [place] [facility] [phone] (phone) [phone] (fax)  [place]

## 2019-10-27 ENCOUNTER — Encounter: Payer: Managed Care, Other (non HMO) | Admitting: Physician Assistant

## 2019-10-27 ENCOUNTER — Encounter: Payer: Self-pay | Admitting: Physician Assistant

## 2019-10-27 ENCOUNTER — Telehealth (INDEPENDENT_AMBULATORY_CARE_PROVIDER_SITE_OTHER): Payer: Managed Care, Other (non HMO) | Admitting: Physician Assistant

## 2019-10-27 DIAGNOSIS — J0140 Acute pansinusitis, unspecified: Secondary | ICD-10-CM | POA: Diagnosis not present

## 2019-10-27 DIAGNOSIS — H66002 Acute suppurative otitis media without spontaneous rupture of ear drum, left ear: Secondary | ICD-10-CM

## 2019-10-27 DIAGNOSIS — B379 Candidiasis, unspecified: Secondary | ICD-10-CM

## 2019-10-27 DIAGNOSIS — T3695XA Adverse effect of unspecified systemic antibiotic, initial encounter: Secondary | ICD-10-CM

## 2019-10-27 MED ORDER — AMOXICILLIN-POT CLAVULANATE 875-125 MG PO TABS: 1.0000 | ORAL_TABLET | Freq: Two times a day (BID) | ORAL | 0 refills | Status: DC

## 2019-10-27 MED ORDER — FLUCONAZOLE 150 MG PO TABS: 150.0000 mg | ORAL_TABLET | Freq: Once | ORAL | 0 refills | Status: AC

## 2019-10-27 NOTE — Patient Instructions (Signed)
Otitis Media, Adult  Otitis media occurs when there is inflammation and fluid in the middle ear. Your middle ear is a part of the ear that contains bones for hearing as well as air that helps send sounds to your brain. What are the causes? This condition is caused by a blockage in the eustachian tube. This tube drains fluid from the ear to the back of the nose (nasopharynx). A blockage in this tube can be caused by an object or by swelling (edema) in the tube. Problems that can cause a blockage include:  A cold or other upper respiratory infection.  Allergies.  An irritant, such as tobacco smoke.  Enlarged adenoids. The adenoids are areas of soft tissue located high in the back of the throat, behind the nose and the roof of the mouth.  A mass in the nasopharynx.  Damage to the ear caused by pressure changes (barotrauma). What are the signs or symptoms? Symptoms of this condition include:  Ear pain.  A fever.  Decreased hearing.  A headache.  Tiredness (lethargy).  Fluid leaking from the ear.  Ringing in the ear. How is this diagnosed? This condition is diagnosed with a physical exam. During the exam your health care provider will use an instrument called an otoscope to look into your ear and check for redness, swelling, and fluid. He or she will also ask about your symptoms. Your health care provider may also order tests, such as:  A test to check the movement of the eardrum (pneumatic otoscopy). This test is done by squeezing a small amount of air into the ear.  A test that changes air pressure in the middle ear to check how well the eardrum moves and whether the eustachian tube is working (tympanogram). How is this treated? This condition usually goes away on its own within 3-5 days. But if the condition is caused by a bacteria infection and does not go away own its own, or keeps coming back, your health care provider may:  Prescribe antibiotic medicines to treat the  infection.  Prescribe or recommend medicines to control pain. Follow these instructions at home:  Take over-the-counter and prescription medicines only as told by your health care provider.  If you were prescribed an antibiotic medicine, take it as told by your health care provider. Do not stop taking the antibiotic even if you start to feel better.  Keep all follow-up visits as told by your health care provider. This is important. Contact a health care provider if:  You have bleeding from your nose.  There is a lump on your neck.  You are not getting better in 5 days.  You feel worse instead of better. Get help right away if:  You have severe pain that is not controlled with medicine.  You have swelling, redness, or pain around your ear.  You have stiffness in your neck.  A part of your face is paralyzed.  The bone behind your ear (mastoid) is tender when you touch it.  You develop a severe headache. Summary  Otitis media is redness, soreness, and swelling of the middle ear.  This condition usually goes away on its own within 3-5 days.  If the problem does not go away in 3-5 days, your health care provider may prescribe or recommend medicines to treat your symptoms.  If you were prescribed an antibiotic medicine, take it as told by your health care provider. This information is not intended to replace advice given to you by   your health care provider. Make sure you discuss any questions you have with your health care provider. Document Revised: 01/10/2017 Document Reviewed: 01/19/2016 Elsevier Patient Education  [place]. Sinusitis, Adult Sinusitis is inflammation of your sinuses. Sinuses are hollow spaces in the bones around your face. Your sinuses are located:  Around your eyes.  In the middle of your forehead.  Behind your nose.  In your cheekbones. Mucus normally drains out of your sinuses. When your nasal tissues become inflamed or swollen, mucus  can become trapped or blocked. This allows bacteria, viruses, and fungi to grow, which leads to infection. Most infections of the sinuses are caused by a virus. Sinusitis can develop quickly. It can last for up to 4 weeks (acute) or for more than 12 weeks (chronic). Sinusitis often develops after a cold. What are the causes? This condition is caused by anything that creates swelling in the sinuses or stops mucus from draining. This includes:  Allergies.  Asthma.  Infection from bacteria or viruses.  Deformities or blockages in your nose or sinuses.  Abnormal growths in the nose (nasal polyps).  Pollutants, such as chemicals or irritants in the air.  Infection from fungi (rare). What increases the risk? You are more likely to develop this condition if you:  Have a weak body defense system (immune system).  Do a lot of swimming or diving.  Overuse nasal sprays.  Smoke. What are the signs or symptoms? The main symptoms of this condition are pain and a feeling of pressure around the affected sinuses. Other symptoms include:  Stuffy nose or congestion.  Thick drainage from your nose.  Swelling and warmth over the affected sinuses.  Headache.  Upper toothache.  A cough that may get worse at night.  Extra mucus that collects in the throat or the back of the nose (postnasal drip).  Decreased sense of smell and taste.  Fatigue.  A fever.  Sore throat.  Bad breath. How is this diagnosed? This condition is diagnosed based on:  Your symptoms.  Your medical history.  A physical exam.  Tests to find out if your condition is acute or chronic. This may include: ? Checking your nose for nasal polyps. ? Viewing your sinuses using a device that has a light (endoscope). ? Testing for allergies or bacteria. ? Imaging tests, such as an MRI or CT scan. In rare cases, a bone biopsy may be done to rule out more serious types of fungal sinus disease. How is this  treated? Treatment for sinusitis depends on the cause and whether your condition is chronic or acute.  If caused by a virus, your symptoms should go away on their own within 10 days. You may be given medicines to relieve symptoms. They include: ? Medicines that shrink swollen nasal passages (topical intranasal decongestants). ? Medicines that treat allergies (antihistamines). ? A spray that eases inflammation of the nostrils (topical intranasal corticosteroids). ? Rinses that help get rid of thick mucus in your nose (nasal saline washes).  If caused by bacteria, your health care provider may recommend waiting to see if your symptoms improve. Most bacterial infections will get better without antibiotic medicine. You may be given antibiotics if you have: ? A severe infection. ? A weak immune system.  If caused by narrow nasal passages or nasal polyps, you may need to have surgery. Follow these instructions at home: Medicines  Take, use, or apply over-the-counter and prescription medicines only as told by your health care provider. These  may include nasal sprays.  If you were prescribed an antibiotic medicine, take it as told by your health care provider. Do not stop taking the antibiotic even if you start to feel better. Hydrate and humidify   Drink enough fluid to keep your urine pale yellow. Staying hydrated will help to thin your mucus.  Use a cool mist humidifier to keep the humidity level in your home above 50%.  Inhale steam for 10-15 minutes, 3-4 times a day, or as told by your health care provider. You can do this in the bathroom while a hot shower is running.  Limit your exposure to cool or dry air. Rest  Rest as much as possible.  Sleep with your head raised (elevated).  Make sure you get enough sleep each night. General instructions   Apply a warm, moist washcloth to your face 3-4 times a day or as told by your health care provider. This will help with  discomfort.  Wash your hands often with soap and water to reduce your exposure to germs. If soap and water are not available, use hand sanitizer.  Do not smoke. Avoid being around people who are smoking (secondhand smoke).  Keep all follow-up visits as told by your health care provider. This is important. Contact a health care provider if:  You have a fever.  Your symptoms get worse.  Your symptoms do not improve within 10 days. Get help right away if:  You have a severe headache.  You have persistent vomiting.  You have severe pain or swelling around your face or eyes.  You have vision problems.  You develop confusion.  Your neck is stiff.  You have trouble breathing. Summary  Sinusitis is soreness and inflammation of your sinuses. Sinuses are hollow spaces in the bones around your face.  This condition is caused by nasal tissues that become inflamed or swollen. The swelling traps or blocks the flow of mucus. This allows bacteria, viruses, and fungi to grow, which leads to infection.  If you were prescribed an antibiotic medicine, take it as told by your health care provider. Do not stop taking the antibiotic even if you start to feel better.  Keep all follow-up visits as told by your health care provider. This is important. This information is not intended to replace advice given to you by your health care provider. Make sure you discuss any questions you have with your health care provider. Document Revised: 06/30/2017 Document Reviewed: 06/30/2017 Elsevier Patient Education  [place].

## 2019-11-10 ENCOUNTER — Other Ambulatory Visit: Payer: Self-pay | Admitting: Family Medicine

## 2019-11-10 DIAGNOSIS — E785 Hyperlipidemia, unspecified: Secondary | ICD-10-CM

## 2019-11-10 NOTE — Telephone Encounter (Signed)
Requested medication (s) are due for refill today: Early  Requested medication (s) are on the active medication list: Yes  Last refill:  10/16/19  Future visit scheduled: Yes 11/22/19  Notes to clinic:  Last refill will not last until next appointment.    Requested Prescriptions  Pending Prescriptions Disp Refills   atorvastatin (LIPITOR) 10 MG tablet [Pharmacy Med Name: ATORVASTATIN 10 MG TABLET] 30 tablet 0    Sig: TAKE 1 TABLET BY MOUTH EVERY DAY      Cardiovascular:  Antilipid - Statins Failed - 11/10/2019  2:31 PM      Failed - Total Cholesterol in normal range and within 360 days    Cholesterol, Total  Date Value Ref Range Status  04/14/2018 212 (H) 100 - 199 mg/dL Final          Failed - LDL in normal range and within 360 days    LDL Calculated  Date Value Ref Range Status  04/14/2018 142 (H) 0 - 99 mg/dL Final          Failed - HDL in normal range and within 360 days    HDL  Date Value Ref Range Status  04/14/2018 36 (L) >39 mg/dL Final          Failed - Triglycerides in normal range and within 360 days    Triglycerides  Date Value Ref Range Status  04/14/2018 170 (H) 0 - 149 mg/dL Final          Passed - Patient is not pregnant      Passed - Valid encounter within last 12 months    Recent Outpatient Visits           2 weeks ago Acute non-recurrent pansinusitis   [place], [place], [place]   2 weeks ago Left ear pain   [place], [place], [place]   7 months ago Stress incontinence   [place], [place], [place]   8 months ago Suspected COVID-19 virus infection   [facility] [place], Dionne Bucy, [place]   8 months ago Adjustment disorder with mixed anxiety and depressed mood   [facility], Dionne Bucy, MD

## 2019-11-22 ENCOUNTER — Ambulatory Visit (INDEPENDENT_AMBULATORY_CARE_PROVIDER_SITE_OTHER): Payer: Managed Care, Other (non HMO) | Admitting: Physician Assistant

## 2019-11-22 ENCOUNTER — Other Ambulatory Visit: Payer: Self-pay

## 2019-11-22 ENCOUNTER — Encounter: Payer: Self-pay | Admitting: Physician Assistant

## 2019-11-22 VITALS — BP 119/84 | HR 87 | Temp 98.2°F | Resp 16 | Wt 325.6 lb

## 2019-11-22 DIAGNOSIS — R911 Solitary pulmonary nodule: Secondary | ICD-10-CM

## 2019-11-22 DIAGNOSIS — Z1211 Encounter for screening for malignant neoplasm of colon: Secondary | ICD-10-CM

## 2019-11-22 DIAGNOSIS — Z0000 Encounter for general adult medical examination without abnormal findings: Secondary | ICD-10-CM | POA: Diagnosis not present

## 2019-11-22 DIAGNOSIS — Z124 Encounter for screening for malignant neoplasm of cervix: Secondary | ICD-10-CM | POA: Diagnosis not present

## 2019-11-22 DIAGNOSIS — E785 Hyperlipidemia, unspecified: Secondary | ICD-10-CM

## 2019-11-22 DIAGNOSIS — Z1239 Encounter for other screening for malignant neoplasm of breast: Secondary | ICD-10-CM

## 2019-11-22 DIAGNOSIS — Z114 Encounter for screening for human immunodeficiency virus [HIV]: Secondary | ICD-10-CM

## 2019-11-22 DIAGNOSIS — K760 Fatty (change of) liver, not elsewhere classified: Secondary | ICD-10-CM

## 2019-11-22 DIAGNOSIS — E1169 Type 2 diabetes mellitus with other specified complication: Secondary | ICD-10-CM

## 2019-11-22 DIAGNOSIS — IMO0001 Reserved for inherently not codable concepts without codable children: Secondary | ICD-10-CM

## 2019-11-22 DIAGNOSIS — Z1159 Encounter for screening for other viral diseases: Secondary | ICD-10-CM

## 2019-11-22 DIAGNOSIS — E119 Type 2 diabetes mellitus without complications: Secondary | ICD-10-CM

## 2019-11-22 DIAGNOSIS — Z6841 Body Mass Index (BMI) 40.0 and over, adult: Secondary | ICD-10-CM

## 2019-11-22 LAB — POCT UA - MICROALBUMIN: Microalbumin Ur, POC: 50 mg/L

## 2019-11-22 NOTE — Progress Notes (Signed)
Complete physical exam   Patient: Leslie Morris   DOB: 1970-09-13   49 y.o. Female  MRN: [id number] Visit Date: 11/22/2019  Today's healthcare provider: Mar Daring, PA-C   Chief Complaint  Patient presents with  . Annual Exam   Subjective    Leslie Morris is a 49 y.o. female who presents today for a complete physical exam.  She reports consuming a general diet. Home exercise routine includes walks 4 times a week for 30 minutes. She generally feels fairly well. She reports sleeping fairly well. She does have additional problems to discuss today.  HPI  She would like to have the PET-CT done, reports that she was due to have one but never did it. Has a lung nodule that was noted and was recommended for either pulm referral or PET scan. Patient had decided she would rather have PET scan.   She received the flu shot at work about 2 weeks ago.  Past Medical History:  Diagnosis Date  . Anxiety   . Arthritis    left ankle, wrist, hand  . BMI 50.0-59.9, adult ([place])   . Carpal tunnel syndrome on left   . Family history of adverse reaction to anesthesia    Dad is slow to wake up and n/v  . History of kidney stones   . Left ankle tendonitis   . Medullary sponge kidney    bilateral  . PONV (postoperative nausea and vomiting)    severe  . Right knee meniscal tear   . Type 2 diabetes mellitus ([place])    followed by pcp--- last A1c 6.8 on 09-25-2016  . Urinary incontinence    due to Medullary sponge kidney  . Vitamin D deficiency    Past Surgical History:  Procedure Laterality Date  . ABDOMINAL HYSTERECTOMY  2003  approx.   ovaries remained   . APPENDECTOMY    . CARPAL TUNNEL RELEASE Left 10/24/2016   Procedure: CARPAL TUNNEL RELEASE;  Surgeon: Iran Planas, MD;  Location: [facility];  Service: Orthopedics;  Laterality: Left;  . CHONDROPLASTY Right 10/21/2018   Procedure: CHONDROPLASTY;  Surgeon: Dereck Leep, MD;  Location: [facility];  Service: Orthopedics;  Laterality:  Right;  . COLONOSCOPY  2003 approx.  Arnetha Courser IMPLANT  10-05-2012    [facility]  . HERNIA REPAIR     umbilical  . KNEE ARTHROSCOPY Right 10/21/2018   Procedure: ARTHROSCOPY KNEE, ;  Surgeon: Dereck Leep, MD;  Location: [facility];  Service: Orthopedics;  Laterality: Right;  . LAPAROSCOPIC APPENDECTOMY  12/04/2010   [facility]   and Umbilical Hernia Repair  . LAPAROSCOPIC CHOLECYSTECTOMY  10-10-2009   [facility]  . MENISECTOMY Right 10/21/2018   Procedure: PARTIAL LATERAL MENISECTOMY;  Surgeon: Dereck Leep, MD;  Location: [facility];  Service: Orthopedics;  Laterality: Right;  . ORIF LEFT ANKLE COMPLEX FRACTURES  age 58   same year hardware removed  . TONSILLECTOMY  child  . ULNAR COLLATERAL LIGAMENT REPAIR Left 03/21/2016   Procedure: Left thumb ulnar collateral ligament repair;  Surgeon: Iran Planas, MD;  Location: [place];  Service: Orthopedics;  Laterality: Left;  . URETEROLITHOTOMY  2005   Social History   Socioeconomic History  . Marital status: Single    Spouse name: Not on file  . Number of children: 2  . Years of education: 68  . Highest education level: Not on file  Occupational History  . Occupation: [employer]  Tobacco Use  . Smoking status:  Current Every Day Smoker    Packs/day: 0.50    Years: 15.00    Pack years: 7.50    Types: Cigarettes  . Smokeless tobacco: Never Used  Vaping Use  . Vaping Use: Never used  Substance and Sexual Activity  . Alcohol use: No    Alcohol/week: 0.0 standard drinks  . Drug use: No  . Sexual activity: Not on file  Other Topics Concern  . Not on file  Social History Narrative  . Not on file   Social Determinants of Health   Financial Resource Strain:   . Difficulty of Paying Living Expenses: Not on file  Food Insecurity:   . Worried About Charity fundraiser in the Last Year: Not on file  . Ran Out of Food in the Last Year: Not on file  Transportation Needs:   . Lack of Transportation (Medical): Not on file  . Lack  of Transportation (Non-Medical): Not on file  Physical Activity:   . Days of Exercise per Week: Not on file  . Minutes of Exercise per Session: Not on file  Stress:   . Feeling of Stress : Not on file  Social Connections:   . Frequency of Communication with Friends and Family: Not on file  . Frequency of Social Gatherings with Friends and Family: Not on file  . Attends Religious Services: Not on file  . Active Member of Clubs or Organizations: Not on file  . Attends Archivist Meetings: Not on file  . Marital Status: Not on file  Intimate Partner Violence:   . Fear of Current or Ex-Partner: Not on file  . Emotionally Abused: Not on file  . Physically Abused: Not on file  . Sexually Abused: Not on file   Family Status  Relation Name Status  . Mother  Alive  . Father  Alive  . Mat Aunt  (Not Specified)  . Ethlyn Daniels  (Not Specified)  . Neg Hx  (Not Specified)   Family History  Problem Relation Age of Onset  . Diabetes Father   . Heart attack Father   . Cancer Maternal Aunt        cervical cancer  . Cancer Paternal Aunt        breast cancer  . Heart disease Neg Hx   . Hyperlipidemia Neg Hx   . Hypertension Neg Hx   . Stroke Neg Hx    Allergies  Allergen Reactions  . Levaquin [Levofloxacin In D5w] Anaphylaxis and Rash    Patient Care Team: Mar Daring, PA-C as PCP - General (Family Medicine)   Medications: Outpatient Medications Prior to Visit  Medication Sig  . aspirin EC 81 MG tablet Take 81 mg by mouth daily.  Marland Kitchen atorvastatin (LIPITOR) 10 MG tablet TAKE 1 TABLET BY MOUTH EVERY DAY  . empagliflozin (JARDIANCE) 10 MG TABS tablet Take 1 tablet (10 mg total) by mouth daily. Please schedule an office visit before anymore refills.  . metFORMIN (GLUCOPHAGE) 1000 MG tablet TAKE 1 TABLET (1,000 MG TOTAL) BY MOUTH 2 (TWO) TIMES DAILY WITH A MEAL.  Glory Rosebush Delica Lancets 46F MISC USE AS DIRECTED 3 TIMES A DAY  . ONETOUCH VERIO test strip USE AS DIRECTED    . oxybutynin (DITROPAN) 5 MG tablet Take 1 tablet (5 mg total) by mouth 3 (three) times daily.  Marland Kitchen amoxicillin-clavulanate (AUGMENTIN) 875-125 MG tablet Take 1 tablet by mouth 2 (two) times daily.  . fesoterodine (TOVIAZ) 8 MG TB24 tablet Take 1  tablet (8 mg total) by mouth daily. (Patient not taking: Reported on 10/21/2019)   No facility-administered medications prior to visit.    Review of Systems  Constitutional: Negative.   HENT: Negative.   Eyes: Negative.   Respiratory: Negative.   Cardiovascular: Negative.   Gastrointestinal: Positive for abdominal pain.  Endocrine: Negative.   Genitourinary: Positive for frequency.  Musculoskeletal: Negative.   Skin: Negative.   Allergic/Immunologic: Positive for environmental allergies.  Neurological: Negative.   Hematological: Negative.   Psychiatric/Behavioral: Negative.        Objective    BP 119/84 (BP Location: Left Arm, Patient Position: Sitting, Cuff Size: Large)   Pulse 87   Temp 98.2 F (36.8 C) (Oral)   Resp 16   Wt (!) 325 lb 9.6 oz (147.7 kg)   BMI 50.24 kg/m     Physical Exam Vitals reviewed.  Constitutional:      General: She is not in acute distress.    Appearance: Normal appearance. She is well-developed. She is obese. She is not ill-appearing or diaphoretic.  HENT:     Head: Normocephalic and atraumatic.     Right Ear: Tympanic membrane, ear canal and external ear normal.     Left Ear: Tympanic membrane, ear canal and external ear normal.     Nose: Nose normal.     Mouth/Throat:     Mouth: Mucous membranes are moist.     Pharynx: Oropharynx is clear. No oropharyngeal exudate or posterior oropharyngeal erythema.  Eyes:     General: No scleral icterus.       Right eye: No discharge.        Left eye: No discharge.     Extraocular Movements: Extraocular movements intact.     Conjunctiva/sclera: Conjunctivae normal.     Pupils: Pupils are equal, round, and reactive to light.  Neck:     Thyroid: No  thyromegaly.     Vascular: No carotid bruit or JVD.     Trachea: No tracheal deviation.  Cardiovascular:     Rate and Rhythm: Normal rate and regular rhythm.     Pulses: Normal pulses.     Heart sounds: Normal heart sounds. No murmur heard.  No friction rub. No gallop.   Pulmonary:     Effort: Pulmonary effort is normal. No respiratory distress.     Breath sounds: Normal breath sounds. No wheezing or rales.  Chest:     Chest wall: No tenderness.  Abdominal:     General: Abdomen is flat. Bowel sounds are normal. There is no distension.     Palpations: Abdomen is soft. There is no mass.     Tenderness: There is no abdominal tenderness. There is no guarding or rebound.  Musculoskeletal:        General: No tenderness. Normal range of motion.     Cervical back: Normal range of motion and neck supple. No tenderness.     Right lower leg: No edema.     Left lower leg: No edema.  Lymphadenopathy:     Cervical: No cervical adenopathy.  Skin:    General: Skin is warm and dry.     Capillary Refill: Capillary refill takes less than 2 seconds.     Findings: No rash.  Neurological:     General: No focal deficit present.     Mental Status: She is alert and oriented to person, place, and time. Mental status is at baseline.  Psychiatric:        Mood and Affect:  Mood normal.        Behavior: Behavior normal.        Thought Content: Thought content normal.        Judgment: Judgment normal.     Last depression screening scores PHQ 2/9 Scores 11/22/2019 02/19/2019 12/17/2018  PHQ - 2 Score 2 3 6   PHQ- 9 Score 9 14 19    Last fall risk screening Fall Risk  11/22/2019  Falls in the past year? 0  Number falls in past yr: 0  Injury with Fall? 0  Risk for fall due to : No Fall Risks  Follow up Falls evaluation completed   Last Audit-C alcohol use screening Alcohol Use Disorder Test (AUDIT) 11/22/2019  1. How often do you have a drink containing alcohol? 0  2. How many drinks containing  alcohol do you have on a typical day when you are drinking? 0  3. How often do you have six or more drinks on one occasion? 0  AUDIT-C Score 0  Alcohol Brief Interventions/Follow-up AUDIT Score <7 follow-up not indicated   A score of 3 or more in women, and 4 or more in men indicates increased risk for alcohol abuse, EXCEPT if all of the points are from question 1   No results found for any visits on 11/22/19.  Assessment & Plan    Routine Health Maintenance and Physical Exam  Exercise Activities and Dietary recommendations Goals   None     Immunization History  Administered Date(s) Administered  . Tdap 02/12/2008    Health Maintenance  Topic Date Due  . Hepatitis C Screening  Never done  . PNEUMOCOCCAL POLYSACCHARIDE VACCINE AGE 40-64 HIGH RISK  Never done  . FOOT EXAM  Never done  . OPHTHALMOLOGY EXAM  Never done  . COVID-19 Vaccine (1) Never done  . HIV Screening  Never done  . PAP SMEAR-Modifier  Never done  . HEMOGLOBIN A1C  03/07/2019  . URINE MICROALBUMIN  09/04/2019  . INFLUENZA VACCINE  Never done  . TETANUS/TDAP  12/02/2027    Discussed health benefits of physical activity, and encouraged her to engage in regular exercise appropriate for her age and condition.  1. Annual physical exam Normal physical exam today. Will check labs as below and f/u pending lab results. If labs are stable and WNL she will not need to have these rechecked for one year at her next annual physical exam. She is to call the office in the meantime if she has any acute issue, questions or concerns.  2. Encounter for breast cancer screening using non-mammogram modality Breast exam today was normal. There is no family history of breast cancer. Patient declines mammogram at this time.  3. Colon cancer screening Declines colonoscopy or cologuard at this time.   4. Cervical cancer screening Not a candidate. S/P hysterectomy.  5. Hyperlipidemia associated with type 2 diabetes mellitus  ([place]) Stable. Continue Atorvastatin 10mg . Will check labs as below and f/u pending results. - CBC with Differential/Platelet - Comprehensive metabolic panel - Hemoglobin A1c - TSH - Lipid panel  6. Diabetes mellitus without complication ([facility]) Stable. Continue Jardiance 10mg , metformin 1000mg  BID. Will check labs as below and f/u pending results. - CBC with Differential/Platelet - Comprehensive metabolic panel - Hemoglobin A1c - TSH - Lipid panel - POCT UA - Microalbumin  7. BMI 50.0-59.9, adult ([place]) Counseled patient on healthy lifestyle modifications including dieting and exercise. Patient is going to contact office for f/u in coming 1-2 months for pharmaceutical assistance. Will  check labs as below and f/u pending results.  - CBC with Differential/Platelet - Comprehensive metabolic panel - Hemoglobin A1c - TSH - Lipid panel  8. Screening for HIV without presence of risk factors Will check labs as below and f/u pending results. - HIV Antibody (routine testing w rflx)  9. Encounter for hepatitis C screening test for low risk patient Will check labs as below and f/u pending results. - Hepatitis C antibody  10. Lung nodule < 6cm on CT Noted on chest CT from 03/2019. Patient requesting pet scan over pulmonology referral.  - NM PET (AXUMIN) [place]; Future  11. Solitary pulmonary nodule See above medical treatment plan. - NM PET (AXUMIN) [place]ULL BASE TO MID THIGH; Future  12. Hepatic steatosis Noted on Chest CT. Labs have been stable.  - NM PET (AXUMIN) [place]; Future   No follow-ups on file.     Reynolds Bowl, PA-C, have reviewed all documentation for this visit. The documentation on 11/23/19 for the exam, diagnosis, procedures, and orders are all accurate and complete.   Rubye Beach  [facility] [phone] (phone) [phone] (fax)  [place]

## 2019-11-22 NOTE — Patient Instructions (Signed)
Health Maintenance, Female Adopting a healthy lifestyle and getting preventive care are important in promoting health and wellness. Ask your health care provider about:  The right schedule for you to have regular tests and exams.  Things you can do on your own to prevent diseases and keep yourself healthy. What should I know about diet, weight, and exercise? Eat a healthy diet   Eat a diet that includes plenty of vegetables, fruits, low-fat dairy products, and lean protein.  Do not eat a lot of foods that are high in solid fats, added sugars, or sodium. Maintain a healthy weight Body mass index (BMI) is used to identify weight problems. It estimates body fat based on height and weight. Your health care provider can help determine your BMI and help you achieve or maintain a healthy weight. Get regular exercise Get regular exercise. This is one of the most important things you can do for your health. Most adults should:  Exercise for at least 150 minutes each week. The exercise should increase your heart rate and make you sweat (moderate-intensity exercise).  Do strengthening exercises at least twice a week. This is in addition to the moderate-intensity exercise.  Spend less time sitting. Even light physical activity can be beneficial. Watch cholesterol and blood lipids Have your blood tested for lipids and cholesterol at 47 years of age, then have this test every 5 years. Have your cholesterol levels checked more often if:  Your lipid or cholesterol levels are high.  You are older than 47 years of age.  You are at high risk for heart disease. What should I know about cancer screening? Depending on your health history and family history, you may need to have cancer screening at various ages. This may include screening for:  Breast cancer.  Cervical cancer.  Colorectal cancer.  Skin cancer.  Lung cancer. What should I know about heart disease, diabetes, and high blood  pressure? Blood pressure and heart disease  High blood pressure causes heart disease and increases the risk of stroke. This is more likely to develop in people who have high blood pressure readings, are of African descent, or are overweight.  Have your blood pressure checked: ? Every 3-5 years if you are 18-39 years of age. ? Every year if you are 40 years old or older. Diabetes Have regular diabetes screenings. This checks your fasting blood sugar level. Have the screening done:  Once every three years after age 40 if you are at a normal weight and have a low risk for diabetes.  More often and at a younger age if you are overweight or have a high risk for diabetes. What should I know about preventing infection? Hepatitis B If you have a higher risk for hepatitis B, you should be screened for this virus. Talk with your health care provider to find out if you are at risk for hepatitis B infection. Hepatitis C Testing is recommended for:  Everyone born from 1945 through 1965.  Anyone with known risk factors for hepatitis C. Sexually transmitted infections (STIs)  Get screened for STIs, including gonorrhea and chlamydia, if: ? You are sexually active and are younger than 47 years of age. ? You are older than 47 years of age and your health care provider tells you that you are at risk for this type of infection. ? Your sexual activity has changed since you were last screened, and you are at increased risk for chlamydia or gonorrhea. Ask your health care provider if   you are at risk.  Ask your health care provider about whether you are at high risk for HIV. Your health care provider may recommend a prescription medicine to help prevent HIV infection. If you choose to take medicine to prevent HIV, you should first get tested for HIV. You should then be tested every 3 months for as long as you are taking the medicine. Pregnancy  If you are about to stop having your period (premenopausal) and  you may become pregnant, seek counseling before you get pregnant.  Take 400 to 800 micrograms (mcg) of folic acid every day if you become pregnant.  Ask for birth control (contraception) if you want to prevent pregnancy. Osteoporosis and menopause Osteoporosis is a disease in which the bones lose minerals and strength with aging. This can result in bone fractures. If you are 65 years old or older, or if you are at risk for osteoporosis and fractures, ask your health care provider if you should:  Be screened for bone loss.  Take a calcium or vitamin D supplement to lower your risk of fractures.  Be given hormone replacement therapy (HRT) to treat symptoms of menopause. Follow these instructions at home: Lifestyle  Do not use any products that contain nicotine or tobacco, such as cigarettes, e-cigarettes, and chewing tobacco. If you need help quitting, ask your health care provider.  Do not use street drugs.  Do not share needles.  Ask your health care provider for help if you need support or information about quitting drugs. Alcohol use  Do not drink alcohol if: ? Your health care provider tells you not to drink. ? You are pregnant, may be pregnant, or are planning to become pregnant.  If you drink alcohol: ? Limit how much you use to 0-1 drink a day. ? Limit intake if you are breastfeeding.  Be aware of how much alcohol is in your drink. In the [place] one drink equals one 12 oz bottle of beer (355 mL), one 5 oz glass of wine (148 mL), or one 1 oz glass of hard liquor (44 mL). General instructions  Schedule regular health, dental, and eye exams.  Stay current with your vaccines.  Tell your health care provider if: ? You often feel depressed. ? You have ever been abused or do not feel safe at home. Summary  Adopting a healthy lifestyle and getting preventive care are important in promoting health and wellness.  Follow your health care provider's instructions about healthy  diet, exercising, and getting tested or screened for diseases.  Follow your health care provider's instructions on monitoring your cholesterol and blood pressure. This information is not intended to replace advice given to you by your health care provider. Make sure you discuss any questions you have with your health care provider. Document Revised: 01/21/2018 Document Reviewed: 01/21/2018 Elsevier Patient Education  2020 Elsevier Inc.  

## 2019-11-23 LAB — CBC WITH DIFFERENTIAL/PLATELET
Basophils Absolute: 0.1 10*3/uL (ref 0.0–0.2)
Basos: 0 %
EOS (ABSOLUTE): 0.2 10*3/uL (ref 0.0–0.4)
Eos: 1 %
Hematocrit: 43.9 % (ref 34.0–46.6)
Hemoglobin: 14.3 g/dL (ref 11.1–15.9)
Immature Grans (Abs): 0 10*3/uL (ref 0.0–0.1)
Immature Granulocytes: 0 %
Lymphocytes Absolute: 3.4 10*3/uL — ABNORMAL HIGH (ref 0.7–3.1)
Lymphs: 30 %
MCH: 27.1 pg (ref 26.6–33.0)
MCHC: 32.6 g/dL (ref 31.5–35.7)
MCV: 83 fL (ref 79–97)
Monocytes Absolute: 0.5 10*3/uL (ref 0.1–0.9)
Monocytes: 5 %
Neutrophils Absolute: 7.3 10*3/uL — ABNORMAL HIGH (ref 1.4–7.0)
Neutrophils: 64 %
Platelets: 302 10*3/uL (ref 150–450)
RBC: 5.28 x10E6/uL (ref 3.77–5.28)
RDW: 14.8 % (ref 11.7–15.4)
WBC: 11.4 10*3/uL — ABNORMAL HIGH (ref 3.4–10.8)

## 2019-11-23 LAB — COMPREHENSIVE METABOLIC PANEL
ALT: 18 IU/L (ref 0–32)
AST: 16 IU/L (ref 0–40)
Albumin/Globulin Ratio: 1.6 (ref 1.2–2.2)
Albumin: 4.6 g/dL (ref 3.8–4.8)
Alkaline Phosphatase: 85 IU/L (ref 44–121)
BUN/Creatinine Ratio: 21 (ref 9–23)
BUN: 15 mg/dL (ref 6–24)
Bilirubin Total: 0.4 mg/dL (ref 0.0–1.2)
CO2: 22 mmol/L (ref 20–29)
Calcium: 10.1 mg/dL (ref 8.7–10.2)
Chloride: 100 mmol/L (ref 96–106)
Creatinine, Ser: 0.73 mg/dL (ref 0.57–1.00)
GFR calc Af Amer: 114 mL/min/{1.73_m2} (ref >59)
GFR calc non Af Amer: 99 mL/min/{1.73_m2} (ref >59)
Globulin, Total: 2.8 g/dL (ref 1.5–4.5)
Glucose: 110 mg/dL — ABNORMAL HIGH (ref 65–99)
Potassium: 4.3 mmol/L (ref 3.5–5.2)
Sodium: 140 mmol/L (ref 134–144)
Total Protein: 7.4 g/dL (ref 6.0–8.5)

## 2019-11-23 LAB — HEPATITIS C ANTIBODY: Hep C Virus Ab: <0.1 s/co ratio (ref 0.0–0.9)

## 2019-11-23 LAB — HEMOGLOBIN A1C
Est. average glucose Bld gHb Est-mCnc: 151 mg/dL
Hgb A1c MFr Bld: 6.9 % — ABNORMAL HIGH (ref 4.8–5.6)

## 2019-11-23 LAB — LIPID PANEL
Chol/HDL Ratio: 5.1 ratio — ABNORMAL HIGH (ref 0.0–4.4)
Cholesterol, Total: 168 mg/dL (ref 100–199)
HDL: 33 mg/dL — ABNORMAL LOW (ref >39)
LDL Chol Calc (NIH): 82 mg/dL (ref 0–99)
Triglycerides: 326 mg/dL — ABNORMAL HIGH (ref 0–149)
VLDL Cholesterol Cal: 53 mg/dL — ABNORMAL HIGH (ref 5–40)

## 2019-11-23 LAB — HIV ANTIBODY (ROUTINE TESTING W REFLEX): HIV Screen 4th Generation wRfx: NONREACTIVE

## 2019-11-23 LAB — TSH: TSH: 3.57 u[IU]/mL (ref 0.450–4.500)

## 2019-11-28 ENCOUNTER — Other Ambulatory Visit: Payer: Self-pay | Admitting: Physician Assistant

## 2019-11-28 DIAGNOSIS — N393 Stress incontinence (female) (male): Secondary | ICD-10-CM

## 2019-11-28 NOTE — Telephone Encounter (Signed)
Requested Prescriptions  Pending Prescriptions Disp Refills  . oxybutynin (DITROPAN) 5 MG tablet [Pharmacy Med Name: OXYBUTYNIN 5 MG TABLET] 270 tablet 1    Sig: TAKE 1 TABLET BY MOUTH THREE TIMES A DAY     Urology:  Bladder Agents Passed - 11/28/2019 10:40 AM      Passed - Valid encounter within last 12 months    Recent Outpatient Visits          6 days ago Annual physical exam   [place], [place], PA-C   1 month ago Acute non-recurrent pansinusitis   [place], [place]   1 month ago Left ear pain   [place], [place], [place]   7 months ago Stress incontinence   [place], [place]   9 months ago Suspected COVID-19 virus infection   [facility], Dionne Bucy, MD

## 2019-11-30 ENCOUNTER — Other Ambulatory Visit: Payer: Self-pay

## 2019-11-30 ENCOUNTER — Encounter
Admission: RE | Admit: 2019-11-30 | Discharge: 2019-11-30 | Disposition: A | Discharge status: Home / Self Care | Payer: Managed Care, Other (non HMO) | Source: Ambulatory Visit | Attending: Physician Assistant | Admitting: Physician Assistant

## 2019-11-30 ENCOUNTER — Encounter: Payer: Self-pay | Admitting: *Deleted

## 2019-11-30 DIAGNOSIS — R911 Solitary pulmonary nodule: Secondary | ICD-10-CM | POA: Insufficient documentation

## 2019-11-30 DIAGNOSIS — IMO0001 Reserved for inherently not codable concepts without codable children: Secondary | ICD-10-CM

## 2019-11-30 DIAGNOSIS — K760 Fatty (change of) liver, not elsewhere classified: Secondary | ICD-10-CM | POA: Diagnosis present

## 2019-11-30 LAB — GLUCOSE, CAPILLARY: Glucose-Capillary: 144 mg/dL — ABNORMAL HIGH (ref 70–99)

## 2019-11-30 MED ORDER — FLUDEOXYGLUCOSE F - 18 (FDG) INJECTION
16.0000 | Freq: Once | INTRAVENOUS | Status: AC | PRN
  Administered 2019-11-30: 16.65 via INTRAVENOUS

## 2019-12-01 ENCOUNTER — Telehealth: Payer: Self-pay

## 2019-12-01 NOTE — Telephone Encounter (Signed)
Written by Mar Daring, PA-C on 12/01/2019 7:50 AM EDT View Full Comments Seen by patient Gwenith Daily on 12/01/2019 7:51 AM

## 2019-12-01 NOTE — Telephone Encounter (Signed)
-----   Message from Mar Daring, [place] sent at 12/01/2019  7:50 AM EDT ----- Luckily PET scan is negative for the small pulmonary nodule. Most likely a benign process. It was noted you do have kidney stones on the right. Mild fatty liver noted. Mild enlargement of the heart. Important to control BP and continue working on weight loss for both of these issues. Also have a small hernia at the belly button. Only containing fat. Should not cause issue. Also have diverticulosis. These are outpouches on the colon. They can get infected and cause issues. Maintaining good bowel health, avoiding constipation, can help prevent these from causing issues.

## 2019-12-02 ENCOUNTER — Encounter: Payer: Self-pay | Admitting: Physician Assistant

## 2019-12-11 ENCOUNTER — Other Ambulatory Visit: Payer: Self-pay | Admitting: Physician Assistant

## 2019-12-11 DIAGNOSIS — E119 Type 2 diabetes mellitus without complications: Secondary | ICD-10-CM

## 2019-12-11 NOTE — Telephone Encounter (Signed)
Requested Prescriptions  Pending Prescriptions Disp Refills   ONETOUCH VERIO test strip [Pharmacy Med Name: [place] TEST STRIP] 100 strip 3    Sig: USE AS DIRECTED     Endocrinology: Diabetes - Testing Supplies Passed - 12/11/2019  8:46 AM      Passed - Valid encounter within last 12 months    Recent Outpatient Visits          2 weeks ago Annual physical exam   [place], [employer], PA-C   1 month ago Acute non-recurrent pansinusitis   [place], [place]   1 month ago Left ear pain   [place], [place], [place]   8 months ago Stress incontinence   [place], [place]   9 months ago Suspected COVID-19 virus infection   [facility], Dionne Bucy, MD

## 2019-12-15 ENCOUNTER — Other Ambulatory Visit: Payer: Self-pay | Admitting: Family Medicine

## 2020-01-10 ENCOUNTER — Other Ambulatory Visit: Payer: Self-pay | Admitting: Physician Assistant

## 2020-01-10 DIAGNOSIS — E1165 Type 2 diabetes mellitus with hyperglycemia: Secondary | ICD-10-CM

## 2020-01-14 ENCOUNTER — Other Ambulatory Visit: Payer: Self-pay | Admitting: Physician Assistant

## 2020-02-08 ENCOUNTER — Other Ambulatory Visit: Payer: Self-pay | Admitting: Physician Assistant

## 2020-02-08 DIAGNOSIS — E785 Hyperlipidemia, unspecified: Secondary | ICD-10-CM

## 2020-02-28 ENCOUNTER — Telehealth: Payer: Self-pay

## 2020-02-28 NOTE — Telephone Encounter (Signed)
Copied from [place] [id number]. Topic: General - Other >> Feb 28, 2020  3:01 PM Hinda Lenis D wrote: Pt need a covid test / she has symptom / please advise

## 2020-02-29 ENCOUNTER — Other Ambulatory Visit: Payer: Managed Care, Other (non HMO)

## 2020-03-01 ENCOUNTER — Telehealth (INDEPENDENT_AMBULATORY_CARE_PROVIDER_SITE_OTHER): Payer: Managed Care, Other (non HMO) | Admitting: Physician Assistant

## 2020-03-01 ENCOUNTER — Encounter: Payer: Self-pay | Admitting: Physician Assistant

## 2020-03-01 DIAGNOSIS — Z20822 Contact with and (suspected) exposure to covid-19: Secondary | ICD-10-CM | POA: Diagnosis not present

## 2020-03-01 MED ORDER — ALBUTEROL SULFATE HFA 108 (90 BASE) MCG/ACT IN AERS: 2.0000 | INHALATION_SPRAY | Freq: Four times a day (QID) | RESPIRATORY_TRACT | 0 refills | Status: DC | PRN

## 2020-03-01 MED ORDER — BENZONATATE 200 MG PO CAPS: 200.0000 mg | ORAL_CAPSULE | Freq: Three times a day (TID) | ORAL | 0 refills | Status: DC | PRN

## 2020-03-01 MED ORDER — PULSE OXIMETER MISC: 0 refills | Status: DC

## 2020-03-01 NOTE — Telephone Encounter (Signed)
I am unable to sch virtual on Leslie Morris schedule for today. I  Do not have access override. Pt is having muscle pain, fever, chills and cough. Please schedule

## 2020-03-01 NOTE — Telephone Encounter (Signed)
Pt has an appointment 03/01/2020 at 5:20  Thanks,   -Mickel Baas

## 2020-03-01 NOTE — Patient Instructions (Addendum)
Can take to lessen severity: Vit C 500mg  twice daily Quercertin 250-500mg  twice daily Zinc 75-100mg  daily Melatonin 3-6 mg at bedtime Vit D3 1000-2000 IU daily Aspirin 81 mg daily with food (optional) Optional: Famotidine 20mg  daily Also can add tylenol/ibuprofen as needed for fevers and body aches May add Mucinex or Mucinex DM as needed for cough/congestion  COVID-19: What to Do if You Are Sick If you have a fever, cough or other symptoms, you might have COVID-19. Most people have mild illness and are able to recover at home. If you are sick:  Keep track of your symptoms.  If you have an emergency warning sign (including trouble breathing), call 911. Steps to help prevent the spread of COVID-19 if you are sick If you are sick with COVID-19 or think you might have COVID-19, follow the steps below to care for yourself and to help protect other people in your home and community. Stay home except to get medical care  Stay home. Most people with COVID-19 have mild illness and can recover at home without medical care. Do not leave your home, except to get medical care. Do not visit public areas.  Take care of yourself. Get rest and stay hydrated. Take over-the-counter medicines, such as acetaminophen, to help you feel better.  Stay in touch with your doctor. Call before you get medical care. Be sure to get care if you have trouble breathing, or have any other emergency warning signs, or if you think it is an emergency.  Avoid public transportation, ride-sharing, or taxis. Separate yourself from other people As much as possible, stay in a specific room and away from other people and pets in your home. If possible, you should use a separate bathroom. If you need to be around other people or animals in or outside of the home, wear a mask. Tell your close contactsthat they may have been exposed to COVID-19. An infected person can spread COVID-19 starting 48 hours (or 2 days) before the person  has any symptoms or tests positive. By letting your close contacts know they may have been exposed to COVID-19, you are helping to protect everyone.  Additional guidance is available for those living in close quarters and shared housing.  See COVID-19 and Animals if you have questions about pets.  If you are diagnosed with COVID-19, someone from the health department may call you. Answer the call to slow the spread. Monitor your symptoms  Symptoms of COVID-19 include fever, cough, or other symptoms.  Follow care instructions from your healthcare provider and local health department. Your local health authorities may give instructions on checking your symptoms and reporting information. When to seek emergency medical attention Look for emergency warning signs* for COVID-19. If someone is showing any of these signs, seek emergency medical care immediately:  Trouble breathing  Persistent pain or pressure in the chest  New confusion  Inability to wake or stay awake  Pale, gray, or blue-colored skin, lips, or nail beds, depending on skin tone *This list is not all possible symptoms. Please call your medical provider for any other symptoms that are severe or concerning to you. Call 911 or call ahead to your local emergency facility: Notify the operator that you are seeking care for someone who has or may have COVID-19. Call ahead before visiting your doctor  Call ahead. Many medical visits for routine care are being postponed or done by phone or telemedicine.  If you have a medical appointment that cannot be postponed, call  your doctor's office, and tell them you have or may have COVID-19. This will help the office protect themselves and other patients. Get  tested  If you have symptoms of COVID-19, get tested. While waiting for test results, you stay away from others, including staying apart from those living in your household.  You can visit your state, tribal, local, and  territorialhealth department's website to look for the latest local information on testing sites. If you are sick, wear a mask over your nose and mouth  You should wear a mask over your nose and mouth if you must be around other people or animals, including pets (even at home).  You don't need to wear the mask if you are alone. If you can't put on a mask (because of trouble breathing, for example), cover your coughs and sneezes in some other way. Try to stay at least 6 feet away from other people. This will help protect the people around you.  Masks should not be placed on young children under age 16 years, anyone who has trouble breathing, or anyone who is not able to remove the mask without help. Note: During the COVID-19 pandemic, medical grade facemasks are reserved for healthcare workers and some first responders. Cover your coughs and sneezes  Cover your mouth and nose with a tissue when you cough or sneeze.  Throw away used tissues in a lined trash can.  Immediately wash your hands with soap and water for at least 20 seconds. If soap and water are not available, clean your hands with an alcohol-based hand sanitizer that contains at least 60% alcohol. Clean your hands often  Wash your hands often with soap and water for at least 20 seconds. This is especially important after blowing your nose, coughing, or sneezing; going to the bathroom; and before eating or preparing food.  Use hand sanitizer if soap and water are not available. Use an alcohol-based hand sanitizer with at least 60% alcohol, covering all surfaces of your hands and rubbing them together until they feel dry.  Soap and water are the best option, especially if hands are visibly dirty.  Avoid touching your eyes, nose, and mouth with unwashed hands.  Handwashing Tips Avoid sharing personal household items  Do not share dishes, drinking glasses, cups, eating utensils, towels, or bedding with other people in your  home.  Wash these items thoroughly after using them with soap and water or put in the dishwasher. Clean all "high-touch" surfaces everyday  Clean and disinfect high-touch surfaces in your "sick room" and bathroom; wear disposable gloves. Let someone else clean and disinfect surfaces in common areas, but you should clean your bedroom and bathroom, if possible.  If a caregiver or other person needs to clean and disinfect a sick person's bedroom or bathroom, they should do so on an as-needed basis. The caregiver/other person should wear a mask and disposable gloves prior to cleaning. They should wait as long as possible after the person who is sick has used the bathroom before coming in to clean and use the bathroom. ? High-touch surfaces include phones, remote controls, counters, tabletops, doorknobs, bathroom fixtures, toilets, keyboards, tablets, and bedside tables.  Clean and disinfect areas that may have blood, stool, or body fluids on them.  Use household cleaners and disinfectants. Clean the area or item with soap and water or another detergent if it is dirty. Then, use a household disinfectant. ? Be sure to follow the instructions on the label to ensure safe and  effective use of the product. Many products recommend keeping the surface wet for several minutes to ensure germs are killed. Many also recommend precautions such as wearing gloves and making sure you have good ventilation during use of the product. ? Use a product from [employer] List N: Disinfectants for Coronavirus (MOLMB-86). ? Complete Disinfection Guidance When you can be around others after being sick with COVID-19 Deciding when you can be around others is different for different situations. Find out when you can safely end home isolation. For any additional questions about your care, contact your healthcare provider or state or local health department. 04/28/2019 Content source: [facility] for Immunization and Respiratory  Diseases (NCIRD), Division of Viral Diseases This information is not intended to replace advice given to you by your health care provider. Make sure you discuss any questions you have with your health care provider. Document Revised: 12/13/2019 Document Reviewed: 12/13/2019 Elsevier Patient Education  2021 [employer].

## 2020-03-01 NOTE — Progress Notes (Signed)
Virtual telephone visit    Virtual Visit via Telephone Note   This visit type was conducted due to national recommendations for restrictions regarding the COVID-19 Pandemic (e.g. social distancing) in an effort to limit this patient's exposure and mitigate transmission in our community. Due to her co-morbid illnesses, this patient is at least at moderate risk for complications without adequate follow up. This format is felt to be most appropriate for this patient at this time. The patient did not have access to video technology or had technical difficulties with video requiring transitioning to audio format only (telephone). Physical exam was limited to content and character of the telephone converstion.    Patient location: Home Provider location: [facility]  I discussed the limitations of evaluation and management by telemedicine and the availability of in person appointments. The patient expressed understanding and agreed to proceed.   Visit Date: 03/01/2020  Today's healthcare provider: Mar Daring, PA-C   No chief complaint on file.  Subjective    URI  This is a new problem. The current episode started in the past 7 days (Saturday afternoon, 02/26/20). The problem has been gradually worsening. Maximum temperature: unable to check. Associated symptoms include congestion, coughing, diarrhea, headaches and a sore throat. Pertinent negatives include no abdominal pain, chest pain, dysuria, ear pain, nausea, neck pain, rash, rhinorrhea, sinus pain, vomiting or wheezing. She has tried acetaminophen for the symptoms. The treatment provided no relief.        Patient Active Problem List   Diagnosis Date Noted  . Hyperlipidemia associated with type 2 diabetes mellitus ([place]) 04/05/2019  . Adjustment disorder with mixed anxiety and depressed mood 12/17/2018  . BMI 50.0-59.9, adult ([place]) 01/04/2018  . Stress incontinence 12/04/2017  . History of recurrent UTI  (urinary tract infection) 12/04/2017  . Diabetes mellitus without complication ([place]) 16/11/9602  . Carpal tunnel syndrome of left wrist 03/07/2017  . Right knee pain 06/30/2015   Past Medical History:  Diagnosis Date  . Anxiety   . Arthritis    left ankle, wrist, hand  . BMI 50.0-59.9, adult ([place])   . Carpal tunnel syndrome on left   . Family history of adverse reaction to anesthesia    Dad is slow to wake up and n/v  . History of kidney stones   . Left ankle tendonitis   . Medullary sponge kidney    bilateral  . PONV (postoperative nausea and vomiting)    severe  . Right knee meniscal tear   . Type 2 diabetes mellitus ([place])    followed by pcp--- last A1c 6.8 on 09-25-2016  . Urinary incontinence    due to Medullary sponge kidney  . Vitamin D deficiency    Social History   Tobacco Use  . Smoking status: Current Every Day Smoker    Packs/day: 0.50    Years: 15.00    Pack years: 7.50    Types: Cigarettes  . Smokeless tobacco: Never Used  Vaping Use  . Vaping Use: Never used  Substance Use Topics  . Alcohol use: No    Alcohol/week: 0.0 standard drinks  . Drug use: No   Allergies  Allergen Reactions  . Levaquin [Levofloxacin In D5w] Anaphylaxis and Rash    Medications: Outpatient Medications Prior to Visit  Medication Sig  . aspirin EC 81 MG tablet Take 81 mg by mouth daily.  Marland Kitchen atorvastatin (LIPITOR) 10 MG tablet TAKE 1 TABLET BY MOUTH EVERY DAY  . JARDIANCE 10 MG  TABS tablet TAKE 1 TABLET (10 MG TOTAL) BY MOUTH DAILY. PLEASE SCHEDULE AN OFFICE VISIT BEFORE ANYMORE REFILLS.  . metFORMIN (GLUCOPHAGE) 1000 MG tablet TAKE 1 TABLET (1,000 MG TOTAL) BY MOUTH 2 (TWO) TIMES DAILY WITH A MEAL.  Glory Rosebush Delica Lancets 88C MISC USE AS DIRECTED 3 TIMES A DAY  . ONETOUCH VERIO test strip USE AS DIRECTED  . oxybutynin (DITROPAN) 5 MG tablet TAKE 1 TABLET BY MOUTH THREE TIMES A DAY   No facility-administered medications prior to visit.    Review of Systems   Constitutional: Positive for chills, diaphoresis, fatigue and fever. Negative for activity change, appetite change and unexpected weight change.  HENT: Positive for congestion and sore throat. Negative for ear discharge, ear pain, hearing loss, nosebleeds, postnasal drip, rhinorrhea, sinus pressure, sinus pain, tinnitus and trouble swallowing.   Eyes: Negative.  Negative for photophobia, pain, discharge and redness.  Respiratory: Positive for cough and shortness of breath. Negative for apnea, choking, chest tightness, wheezing and stridor.   Cardiovascular: Negative.  Negative for chest pain, palpitations and leg swelling.  Gastrointestinal: Positive for diarrhea. Negative for abdominal distention, abdominal pain, anal bleeding, blood in stool, constipation, nausea, rectal pain and vomiting.  Endocrine: Negative for polydipsia.  Genitourinary: Negative for dysuria, flank pain, frequency, hematuria and urgency.  Musculoskeletal: Positive for myalgias. Negative for back pain and neck pain.  Skin: Negative for rash.  Allergic/Immunologic: Positive for environmental allergies.  Neurological: Positive for dizziness and headaches. Negative for tremors, seizures, weakness and light-headedness.  Hematological: Does not bruise/bleed easily.  Psychiatric/Behavioral: Negative for hallucinations and suicidal ideas. The patient is not nervous/anxious.       Objective    LMP 11/30/1998 (Approximate) Comment: Hysterectomy at age 77 yo     Assessment & Plan     1. Suspected COVID-19 virus infection Symptoms are pretty consistent with covid 19. She was tested this morning but does not have her results yet. Will treat symptomatically. Add tessalon for cough, albuterol for SOB, wheezing, and/or chest tightness.  - benzonatate (TESSALON) 200 MG capsule; Take 1 capsule (200 mg total) by mouth 3 (three) times daily as needed.  Dispense: 30 capsule; Refill: 0 - albuterol (VENTOLIN HFA) 108 (90 Base) MCG/ACT  inhaler; Inhale 2 puffs into the lungs every 6 (six) hours as needed for wheezing or shortness of breath.  Dispense: 8 g; Refill: 0   No follow-ups on file.    I discussed the assessment and treatment plan with the patient. The patient was provided an opportunity to ask questions and all were answered. The patient agreed with the plan and demonstrated an understanding of the instructions.   The patient was advised to call back or seek an in-person evaluation if the symptoms worsen or if the condition fails to improve as anticipated.  I provided 18 minutes of non-face-to-face time during this encounter.  Reynolds Bowl, PA-C, have reviewed all documentation for this visit. The documentation on 03/01/20 for the exam, diagnosis, procedures, and orders are all accurate and complete.  [place] [facility] [phone] (phone) [phone] (fax)  [place]

## 2020-03-02 ENCOUNTER — Encounter: Payer: Self-pay | Admitting: Physician Assistant

## 2020-03-02 DIAGNOSIS — Z20822 Contact with and (suspected) exposure to covid-19: Secondary | ICD-10-CM

## 2020-03-03 MED ORDER — PULSE OXIMETER MISC: 0 refills | Status: DC

## 2020-03-03 NOTE — Addendum Note (Signed)
Addended by: Mar Daring on: 03/03/2020 11:31 AM   Modules accepted: Orders

## 2020-03-06 ENCOUNTER — Telehealth (INDEPENDENT_AMBULATORY_CARE_PROVIDER_SITE_OTHER): Payer: Managed Care, Other (non HMO) | Admitting: Physician Assistant

## 2020-03-06 DIAGNOSIS — U071 COVID-19: Secondary | ICD-10-CM

## 2020-03-06 MED ORDER — AZITHROMYCIN 250 MG PO TABS: ORAL_TABLET | ORAL | 0 refills | Status: DC

## 2020-03-06 MED ORDER — PREDNISONE 20 MG PO TABS: 20.0000 mg | ORAL_TABLET | Freq: Every day | ORAL | 0 refills | Status: DC

## 2020-03-06 NOTE — Telephone Encounter (Signed)
Can we call and see if she can do a virtual appt this evening to answer questions and see how she is feeling and such.

## 2020-03-06 NOTE — Telephone Encounter (Signed)
Apt today at 5:40.  (03/06/2020)  Thanks,   -Mickel Baas

## 2020-03-06 NOTE — Progress Notes (Unsigned)
MyChart Video Visit    Virtual Visit via Video Note   This visit type was conducted due to national recommendations for restrictions regarding the COVID-19 Pandemic (e.g. social distancing) in an effort to limit this patient's exposure and mitigate transmission in our community. This patient is at least at moderate risk for complications without adequate follow up. This format is felt to be most appropriate for this patient at this time. Physical exam was limited by quality of the video and audio technology used for the visit.   Patient location: Home Provider location: [facility]  I discussed the limitations of evaluation and management by telemedicine and the availability of in person appointments. The patient expressed understanding and agreed to proceed.  Patient: Leslie Morris   DOB: 1970/02/29   50 y.o. Female  MRN: [id number] Visit Date: 03/06/2020  Today's healthcare provider: Mar Daring, PA-C   Chief Complaint  Patient presents with  . Covid Positive   Subjective    HPI  Leslie Morris is a 50 yr old female that presents today for worsening symptoms with Covid-19. Symptoms started on 02/26/19 and tested positive for Covid 19 on 03/03/19. She reports she was feeling ok and thought she was getting better. Then yesterday she started feeling worse again, started getting low fever, body aches, chills and worsening cough. She reports she is having more fatigue and cough than previously.   Patient Active Problem List   Diagnosis Date Noted  . Hyperlipidemia associated with type 2 diabetes mellitus ([place]) 04/05/2019  . Adjustment disorder with mixed anxiety and depressed mood 12/17/2018  . BMI 50.0-59.9, adult ([place]) 01/04/2018  . Stress incontinence 12/04/2017  . History of recurrent UTI (urinary tract infection) 12/04/2017  . Diabetes mellitus without complication ([place]) [id number]  . Carpal tunnel syndrome of left wrist 03/07/2017  . Right knee pain  06/30/2015   Past Medical History:  Diagnosis Date  . Anxiety   . Arthritis    left ankle, wrist, hand  . BMI 50.0-59.9, adult ([place])   . Carpal tunnel syndrome on left   . Family history of adverse reaction to anesthesia    Dad is slow to wake up and n/v  . History of kidney stones   . Left ankle tendonitis   . Medullary sponge kidney    bilateral  . PONV (postoperative nausea and vomiting)    severe  . Right knee meniscal tear   . Type 2 diabetes mellitus ([place])    followed by pcp--- last A1c 6.8 on 09-25-2016  . Urinary incontinence    due to Medullary sponge kidney  . Vitamin D deficiency    Social History   Tobacco Use  . Smoking status: Current Every Day Smoker    Packs/day: 0.50    Years: 15.00    Pack years: 7.50    Types: Cigarettes  . Smokeless tobacco: Never Used  Vaping Use  . Vaping Use: Never used  Substance Use Topics  . Alcohol use: No    Alcohol/week: 0.0 standard drinks  . Drug use: No   Allergies  Allergen Reactions  . Levaquin [Levofloxacin In D5w] Anaphylaxis and Rash    Medications: Outpatient Medications Prior to Visit  Medication Sig  . albuterol (VENTOLIN HFA) 108 (90 Base) MCG/ACT inhaler Inhale 2 puffs into the lungs every 6 (six) hours as needed for wheezing or shortness of breath.  Marland Kitchen aspirin EC 81 MG tablet Take 81 mg by mouth daily.  Marland Kitchen atorvastatin (LIPITOR)  10 MG tablet TAKE 1 TABLET BY MOUTH EVERY DAY  . benzonatate (TESSALON) 200 MG capsule Take 1 capsule (200 mg total) by mouth 3 (three) times daily as needed.  Marland Kitchen JARDIANCE 10 MG TABS tablet TAKE 1 TABLET (10 MG TOTAL) BY MOUTH DAILY. PLEASE SCHEDULE AN OFFICE VISIT BEFORE ANYMORE REFILLS.  . metFORMIN (GLUCOPHAGE) 1000 MG tablet TAKE 1 TABLET (1,000 MG TOTAL) BY MOUTH 2 (TWO) TIMES DAILY WITH A MEAL.  . Misc. Devices (PULSE OXIMETER) MISC To use to monitor oxygen levels daily  . OneTouch Delica Lancets 95G MISC USE AS DIRECTED 3 TIMES A DAY  . ONETOUCH VERIO test strip USE AS  DIRECTED  . oxybutynin (DITROPAN) 5 MG tablet TAKE 1 TABLET BY MOUTH THREE TIMES A DAY   No facility-administered medications prior to visit.    Review of Systems  Constitutional: Positive for fatigue. Negative for activity change, appetite change, chills, diaphoresis, fever and unexpected weight change.  HENT: Positive for congestion, ear discharge and ear pain (Left ear). Negative for sinus pressure, sinus pain, sore throat and trouble swallowing.   Respiratory: Positive for cough, shortness of breath and wheezing. Negative for apnea, choking, chest tightness and stridor.   Gastrointestinal: Positive for diarrhea. Negative for abdominal distention, abdominal pain, anal bleeding, blood in stool, constipation, nausea, rectal pain and vomiting.  Neurological: Negative for dizziness, light-headedness and headaches.      Objective    LMP 11/30/1998 (Approximate) Comment: Hysterectomy at age 81 yo   Physical Exam Vitals reviewed.  Constitutional:      General: She is not in acute distress.    Appearance: Normal appearance. She is well-developed and well-nourished. She is obese. She is ill-appearing.  HENT:     Head: Normocephalic and atraumatic.  Eyes:     Extraocular Movements: EOM normal.  Pulmonary:     Effort: Pulmonary effort is normal. No respiratory distress.  Musculoskeletal:     Cervical back: Normal range of motion and neck supple.  Neurological:     Mental Status: She is alert.  Psychiatric:        Mood and Affect: Mood and affect and mood normal.        Behavior: Behavior normal.        Thought Content: Thought content normal.        Judgment: Judgment normal.       Assessment & Plan     1. COVID-19 Worsening symptoms that have not responded to OTC medications. Will give Zpak and prednisone as below. Suspected second sickening and possible development of atypical pneumonia secondary to covid 19. Stay well hydrated and get plenty of rest. Call if no symptom  improvement or if symptoms worsen. - azithromycin (ZITHROMAX) 250 MG tablet; Take 2 tablets PO on day one, and one tablet PO daily thereafter until completed.  Dispense: 6 tablet; Refill: 0 - predniSONE (DELTASONE) 20 MG tablet; Take 1 tablet (20 mg total) by mouth daily with breakfast.  Dispense: 5 tablet; Refill: 0   No follow-ups on file.     I discussed the assessment and treatment plan with the patient. The patient was provided an opportunity to ask questions and all were answered. The patient agreed with the plan and demonstrated an understanding of the instructions.   The patient was advised to call back or seek an in-person evaluation if the symptoms worsen or if the condition fails to improve as anticipated.  I provided 15 minutes of face-to-face time during this encounter via MyChart Video  enabled encounter.  Reynolds Bowl, PA-C, have reviewed all documentation for this visit. The documentation on 03/07/20 for the exam, diagnosis, procedures, and orders are all accurate and complete.  [place] [facility] [phone] (phone) [phone] (fax)  [place]

## 2020-03-07 ENCOUNTER — Encounter: Payer: Self-pay | Admitting: Physician Assistant

## 2020-03-13 MED ORDER — FLUCONAZOLE 150 MG PO TABS: 150.0000 mg | ORAL_TABLET | Freq: Once | ORAL | 0 refills | Status: AC

## 2020-03-13 NOTE — Addendum Note (Signed)
Addended by: Mar Daring on: 03/13/2020 09:56 AM   Modules accepted: Orders

## 2020-03-24 ENCOUNTER — Other Ambulatory Visit: Payer: Self-pay | Admitting: Physician Assistant

## 2020-03-24 DIAGNOSIS — Z20822 Contact with and (suspected) exposure to covid-19: Secondary | ICD-10-CM

## 2020-03-24 NOTE — Telephone Encounter (Signed)
Requested medications are due for refill today yes  Requested medications are on the active medication list yes  Last refill 1/19  Last visit 03/01/20  Future visit scheduled no  Notes to clinic Was seen 03/01/20 and was given one with no refill, asking for refill one week early, please assess.

## 2020-04-11 ENCOUNTER — Other Ambulatory Visit: Payer: Self-pay | Admitting: Physician Assistant

## 2020-04-11 DIAGNOSIS — E1165 Type 2 diabetes mellitus with hyperglycemia: Secondary | ICD-10-CM

## 2020-05-06 ENCOUNTER — Other Ambulatory Visit: Payer: Self-pay | Admitting: Physician Assistant

## 2020-05-06 NOTE — Telephone Encounter (Signed)
Last ordered 04/11/20 with 1 refill. Requesting refill too early. Refused at this time.

## 2020-05-08 ENCOUNTER — Encounter: Payer: Self-pay | Admitting: Physician Assistant

## 2020-05-08 NOTE — Telephone Encounter (Signed)
Can we schedule her on one of the hospital visit slots please

## 2020-05-09 ENCOUNTER — Other Ambulatory Visit: Payer: Self-pay | Admitting: Physician Assistant

## 2020-05-09 NOTE — Telephone Encounter (Signed)
Apt scheduled with Laverna Peace 05/10/2020 at 2:40.   Thanks,   -Mickel Baas

## 2020-05-10 ENCOUNTER — Other Ambulatory Visit: Payer: Self-pay

## 2020-05-10 ENCOUNTER — Encounter: Payer: Self-pay | Admitting: Adult Health

## 2020-05-10 ENCOUNTER — Ambulatory Visit (INDEPENDENT_AMBULATORY_CARE_PROVIDER_SITE_OTHER): Payer: Managed Care, Other (non HMO) | Admitting: Adult Health

## 2020-05-10 VITALS — BP 116/79 | HR 85 | Ht 65.0 in | Wt 336.0 lb

## 2020-05-10 DIAGNOSIS — N898 Other specified noninflammatory disorders of vagina: Secondary | ICD-10-CM | POA: Diagnosis not present

## 2020-05-10 DIAGNOSIS — R300 Dysuria: Secondary | ICD-10-CM

## 2020-05-10 LAB — POCT URINALYSIS DIPSTICK
Appearance: NORMAL
Bilirubin, UA: NEGATIVE
Glucose, UA: POSITIVE — AB
Ketones, UA: NEGATIVE
Leukocytes, UA: NEGATIVE
Nitrite, UA: POSITIVE
Odor: NORMAL
Protein, UA: NEGATIVE
Spec Grav, UA: 1.015 (ref 1.010–1.025)
Urobilinogen, UA: 0.2 E.U./dL
pH, UA: 5 (ref 5.0–8.0)

## 2020-05-10 MED ORDER — EMPAGLIFLOZIN 10 MG PO TABS: 10.0000 mg | ORAL_TABLET | Freq: Every day | ORAL | 3 refills | Status: DC

## 2020-05-10 MED ORDER — TERCONAZOLE 0.4 % VA CREA: 1.0000 | TOPICAL_CREAM | Freq: Every day | VAGINAL | 0 refills | Status: AC

## 2020-05-10 MED ORDER — METRONIDAZOLE 500 MG PO TABS: 500.0000 mg | ORAL_TABLET | Freq: Two times a day (BID) | ORAL | 0 refills | Status: DC

## 2020-05-10 MED ORDER — NYSTATIN 100000 UNIT/GM EX OINT
1.0000 | TOPICAL_OINTMENT | Freq: Two times a day (BID) | CUTANEOUS | 0 refills | Status: DC
Start: 2020-05-10 — End: 2020-11-30

## 2020-05-10 NOTE — Progress Notes (Signed)
Established patient visit   Patient: Leslie Morris   DOB: 10-01-1970   50 y.o. Female  MRN: [id number] Visit Date: 05/10/2020  Today's healthcare provider: Marcille Buffy, FNP   Chief Complaint  Patient presents with  . Urinary Tract Infection   Subjective    Urinary Tract Infection  This is a recurrent problem. The current episode started more than 1 month ago. The problem occurs intermittently. The problem has been waxing and waning. The quality of the pain is described as burning (itching). The pain is at a severity of 8/10. The pain is moderate. There has been no fever. She is sexually active. There is no history of pyelonephritis. Associated symptoms include frequency, hematuria and urgency. Treatments tried: monostat. The treatment provided mild relief. Her past medical history is significant for kidney stones and recurrent UTIs. There is no history of a single kidney. medulnoary sponge kidney    Pt has been sexually active about 1 mo ago used protection. Pt says she would like a sti check as well. Vaginal itching.  She has incontinence and wears pads she is on oxybutin.  Denies any abnormal discharge.  Itching inflamed skin causing burning.  New partner.   She did the 5 day Monistat and it got better. She the took Diflucan and it cleared.      Medications: Outpatient Medications Prior to Visit  Medication Sig  . albuterol (VENTOLIN HFA) 108 (90 Base) MCG/ACT inhaler TAKE 2 PUFFS BY MOUTH EVERY 6 HOURS AS NEEDED FOR WHEEZE OR SHORTNESS OF BREATH  . aspirin EC 81 MG tablet Take 81 mg by mouth daily.  Marland Kitchen atorvastatin (LIPITOR) 10 MG tablet TAKE 1 TABLET BY MOUTH EVERY DAY  . metFORMIN (GLUCOPHAGE) 1000 MG tablet TAKE 1 TABLET (1,000 MG TOTAL) BY MOUTH 2 (TWO) TIMES DAILY WITH A MEAL.  . Misc. Devices (PULSE OXIMETER) MISC To use to monitor oxygen levels daily  . OneTouch Delica Lancets 40C MISC USE AS DIRECTED 3 TIMES A DAY  . ONETOUCH VERIO test strip USE AS  DIRECTED  . oxybutynin (DITROPAN) 5 MG tablet TAKE 1 TABLET BY MOUTH THREE TIMES A DAY  . [DISCONTINUED] empagliflozin (JARDIANCE) 10 MG TABS tablet Take 1 tablet (10 mg total) by mouth daily.  Marland Kitchen azithromycin (ZITHROMAX) 250 MG tablet Take 2 tablets PO on day one, and one tablet PO daily thereafter until completed. (Patient not taking: Reported on 05/10/2020)  . benzonatate (TESSALON) 200 MG capsule Take 1 capsule (200 mg total) by mouth 3 (three) times daily as needed. (Patient not taking: Reported on 05/10/2020)  . predniSONE (DELTASONE) 20 MG tablet Take 1 tablet (20 mg total) by mouth daily with breakfast. (Patient not taking: Reported on 05/10/2020)   No facility-administered medications prior to visit.    Review of Systems  Genitourinary: Positive for frequency, hematuria and urgency.       Objective    BP 116/79 (BP Location: Right Arm, Patient Position: Sitting, Cuff Size: Large)   Pulse 85   Ht 5\' 5"  (1.651 m)   Wt (!) 336 lb (152.4 kg)   LMP 11/30/1998 (Approximate) Comment: Hysterectomy at age 54 yo  SpO2 99%   BMI 55.91 kg/m     Physical Exam Vitals reviewed.  Constitutional:      General: She is not in acute distress.    Appearance: Normal appearance. She is obese. She is not ill-appearing, toxic-appearing or diaphoretic.  HENT:     Right Ear: External ear normal.  Left Ear: External ear normal.     Mouth/Throat:     Pharynx: Oropharynx is clear.  Eyes:     Conjunctiva/sclera: Conjunctivae normal.  Cardiovascular:     Rate and Rhythm: Normal rate and regular rhythm.     Pulses: Normal pulses.     Heart sounds: Normal heart sounds.  Pulmonary:     Effort: Pulmonary effort is normal.     Breath sounds: Normal breath sounds.  Abdominal:     General: There is no distension.     Palpations: Abdomen is soft.     Tenderness: There is no abdominal tenderness.     Hernia: There is no hernia in the left inguinal area or right inguinal area.  Genitourinary:     General: Normal vulva.     Labia:        Right: Tenderness present.        Left: Tenderness present.      Vagina: No signs of injury. Vaginal discharge, erythema and tenderness present. No bleeding, lesions or prolapsed vaginal walls.     Comments: Chaperon- she did not want a chaperon.   Lymphadenopathy:     Lower Body: No right inguinal adenopathy. No left inguinal adenopathy.  Skin:    General: Skin is warm.     Findings: Rash present.  Neurological:     Mental Status: She is alert and oriented to person, place, and time.  Psychiatric:        Mood and Affect: Mood normal.        Behavior: Behavior normal.        Thought Content: Thought content normal.        Judgment: Judgment normal.      Results for orders placed or performed in visit on 05/10/20  POCT Urinalysis Dipstick  Result Value Ref Range   Color, UA yellow    Clarity, UA turbid    Glucose, UA Positive (A) Negative   Bilirubin, UA negative    Ketones, UA negative    Spec Grav, UA 1.015 1.010 - 1.025   Blood, UA Trace    pH, UA 5.0 5.0 - 8.0   Protein, UA Negative Negative   Urobilinogen, UA 0.2 0.2 or 1.0 E.U./dL   Nitrite, UA positive    Leukocytes, UA Negative Negative   Appearance normal    Odor normal     Assessment & Plan     Dysuria - Plan: POCT Urinalysis Dipstick, Urine Culture, Urinalysis, microscopic only, empagliflozin (JARDIANCE) 10 MG TABS tablet, terconazole (TERAZOL 7) 0.4 % vaginal cream, nystatin ointment (MYCOSTATIN), metroNIDAZOLE (FLAGYL) 500 MG tablet, CBC with Differential/Platelet, Comprehensive Metabolic Panel (CMET), TSH, HgB A1c, NuSwab Vaginitis Plus (VG+)  Itching in the vaginal area - Plan: NuSwab Vaginitis Plus (VG+)  Meds ordered this encounter  Medications  . empagliflozin (JARDIANCE) 10 MG TABS tablet    Sig: Take 1 tablet (10 mg total) by mouth daily.    Dispense:  90 tablet    Refill:  3  . terconazole (TERAZOL 7) 0.4 % vaginal cream    Sig: Place 1 applicator  vaginally at bedtime for 5 days.    Dispense:  45 g    Refill:  0  . nystatin ointment (MYCOSTATIN)    Sig: Apply 1 application topically 2 (two) times daily.    Dispense:  30 g    Refill:  0  . metroNIDAZOLE (FLAGYL) 500 MG tablet    Sig: Take 1 tablet (500 mg total) by mouth 2 (  two) times daily.    Dispense:  14 tablet    Refill:  0   Needed refill on her jardiance.   Orders Placed This Encounter  Procedures  . Urine Culture  . Urinalysis, microscopic only  . CBC with Differential/Platelet  . Comprehensive Metabolic Panel (CMET)  . TSH  . HgB A1c  . NuSwab Vaginitis Plus (VG+)  . POCT Urinalysis Dipstick   Due for regular health maintenance.  Return if symptoms worsen or fail to improve, for at any time for any worsening symptoms, Go to Emergency room/ urgent care if worse.      Red Flags discussed. The patient was given clear instructions to go to ER or return to medical center if any red flags develop, symptoms do not improve, worsen or new problems develop. They verbalized understanding.  The entirety of the information documented in the History of Present Illness, Review of Systems and Physical Exam were personally obtained by me. Portions of this information were initially documented by the CMA and reviewed by me for thoroughness and accuracy.      Marcille Buffy, [place] [phone] (phone) [phone] (fax)  [place]

## 2020-05-10 NOTE — Patient Instructions (Signed)
Terconazole vaginal cream What is this medicine? TERCONAZOLE (ter KON a zole) is an antifungal medicine. It is used to treat yeast infections of the vagina. This medicine may be used for other purposes; ask your health care provider or pharmacist if you have questions. COMMON BRAND NAME(S): Terazol 3, Terazol 7, Zazole What should I tell my health care provider before I take this medicine? They need to know if you have any of these conditions:  an unusual or allergic reaction to terconazole, other antifungals, other medicines, foods, dyes or preservatives  pregnant or trying to get pregnant  breast-feeding How should I use this medicine? This medicine is only for use in the vagina. Do not take by mouth. Wash hands before and after use. Read package directions carefully before using. Use this medicine at bedtime, unless otherwise directed by your doctor or health care professional. Screw the applicator onto the end of the tube and squeeze the tube to fill the applicator. Remove the applicator from the tube. Lie on your back. Gently insert the applicator tip high in the vagina and push the plunger to release the cream into the vagina. Gently remove the applicator. Wash the applicator well with warm water and soap. Use at regular intervals. Do not get this medicine in your eyes. If you do, rinse out with plenty of cool tap water. Finish the full course prescribed by your doctor or health care professional even if you think your condition is better. Do not stop using this medicine if your menstrual period starts during the time of treatment. Talk to your pediatrician regarding the use of this medicine in children. Special care may be needed. Overdosage: If you think you have taken too much of this medicine contact a poison control center or emergency room at once. NOTE: This medicine is only for you. Do not share this medicine with others. What if I miss a dose? If you miss a dose, use it as soon as  you can. If it is almost time for your next dose, use only that dose. Do not use double or extra doses. What may interact with this medicine? Interactions are not expected. Do not use any other vaginal products without telling your doctor or health care professional. This list may not describe all possible interactions. Give your health care provider a list of all the medicines, herbs, non-prescription drugs, or dietary supplements you use. Also tell them if you smoke, drink alcohol, or use illegal drugs. Some items may interact with your medicine. What should I watch for while using this medicine? Tell your doctor or health care professional if your symptoms do not start to get better within a few days. It is better not to have sex until you have finished your treatment. If you have sex, your partner should use a condom during sex to help prevent transfer of the infection. Your sexual partner may also need treatment. Vaginal medicines usually will come out of the vagina during treatment. To keep the medicine from getting on your clothing, wear a mini-pad or sanitary napkin. The use of tampons is not recommended since they may soak up the medicine. To help clear up the infection, wear freshly washed cotton, not synthetic, underwear. What side effects may I notice from receiving this medicine? Side effects that you should report to your doctor or health care professional as soon as possible:  painful or difficult urination  vaginal pain Side effects that usually do not require medical attention (report to your doctor  or health care professional if they continue or are bothersome):  headache  menstrual pain  stomach upset  vaginal irritation, itching or burning This list may not describe all possible side effects. Call your doctor for medical advice about side effects. You may report side effects to FDA at 1-800-FDA-1088. Where should I keep my medicine? Keep out of the reach of  children. Store at room temperature between 15 and 30 degrees C (59 and 86 degrees F). Throw away any unused medicine after the expiration date. NOTE: This sheet is a summary. It may not cover all possible information. If you have questions about this medicine, talk to your doctor, pharmacist, or health care provider.  2021 Elsevier/Gold Standard (2007-10-14 13:51:27) Vaginal Yeast Infection, Adult  Vaginal yeast infection is a condition that causes vaginal discharge as well as soreness, swelling, and redness (inflammation) of the vagina. This is a common condition. Some women get this infection frequently. What are the causes? This condition is caused by a change in the normal balance of the yeast (candida) and bacteria that live in the vagina. This change causes an overgrowth of yeast, which causes the inflammation. What increases the risk? The condition is more likely to develop in women who:  Take antibiotic medicines.  Have diabetes.  Take birth control pills.  Are pregnant.  Douche often.  Have a weak body defense system (immune system).  Have been taking steroid medicines for a long time.  Frequently wear tight clothing. What are the signs or symptoms? Symptoms of this condition include:  White, thick, creamy vaginal discharge.  Swelling, itching, redness, and irritation of the vagina. The lips of the vagina (vulva) may be affected as well.  Pain or a burning feeling while urinating.  Pain during sex. How is this diagnosed? This condition is diagnosed based on:  Your medical history.  A physical exam.  A pelvic exam. Your health care provider will examine a sample of your vaginal discharge under a microscope. Your health care provider may send this sample for testing to confirm the diagnosis. How is this treated? This condition is treated with medicine. Medicines may be over-the-counter or prescription. You may be told to use one or more of the  following:  Medicine that is taken by mouth (orally).  Medicine that is applied as a cream (topically).  Medicine that is inserted directly into the vagina (suppository). Follow these instructions at home: Lifestyle  Do not have sex until your health care provider approves. Tell your sex partner that you have a yeast infection. That person should go to his or her health care provider and ask if they should also be treated.  Do not wear tight clothes, such as pantyhose or tight pants.  Wear breathable cotton underwear. General instructions  Take or apply over-the-counter and prescription medicines only as told by your health care provider.  Eat more yogurt. This may help to keep your yeast infection from returning.  Do not use tampons until your health care provider approves.  Try taking a sitz bath to help with discomfort. This is a warm water bath that is taken while you are sitting down. The water should only come up to your hips and should cover your buttocks. Do this 3-4 times per day or as told by your health care provider.  Do not douche.  If you have diabetes, keep your blood sugar levels under control.  Keep all follow-up visits as told by your health care provider. This is important.  Contact a health care provider if:  You have a fever.  Your symptoms go away and then return.  Your symptoms do not get better with treatment.  Your symptoms get worse.  You have new symptoms.  You develop blisters in or around your vagina.  You have blood coming from your vagina and it is not your menstrual period.  You develop pain in your abdomen. Summary  Vaginal yeast infection is a condition that causes discharge as well as soreness, swelling, and redness (inflammation) of the vagina.  This condition is treated with medicine. Medicines may be over-the-counter or prescription.  Take or apply over-the-counter and prescription medicines only as told by your health care  provider.  Do not douche. Do not have sex or use tampons until your health care provider approves.  Contact a health care provider if your symptoms do not get better with treatment or your symptoms go away and then return. This information is not intended to replace advice given to you by your health care provider. Make sure you discuss any questions you have with your health care provider. Document Revised: 08/28/2018 Document Reviewed: 06/16/2017 Elsevier Patient Education  2021 [place]. Metronidazole tablets or capsules What is this medicine? METRONIDAZOLE (me troe NI da zole) is an antiinfective. It is used to treat certain kinds of bacterial and protozoal infections. It will not work for colds, flu, or other viral infections. This medicine may be used for other purposes; ask your health care provider or pharmacist if you have questions. COMMON BRAND NAME(S): Flagyl What should I tell my health care provider before I take this medicine? They need to know if you have any of these conditions:  Cockayne syndrome  history of blood diseases such as sickle cell anemia, anemia, or leukemia  if you often drink alcohol  irregular heartbeat or rhythm  kidney disease  liver disease  yeast or fungal infection  an unusual or allergic reaction to metronidazole, nitroimidazoles, or other medicines, foods, dyes, or preservatives  pregnant or trying to get pregnant  breast-feeding How should I use this medicine? Take this medicine by mouth with water. Take it as directed on the prescription label at the same time every day. Take all of this medicine unless your health care provider tells you to stop it early. Keep taking it even if you think you are better. Talk to your health care provider about the use of this medicine in children. While it may be prescribed for children for selected conditions, precautions do apply. Overdosage: If you think you have taken too much of this medicine  contact a poison control center or emergency room at once. NOTE: This medicine is only for you. Do not share this medicine with others. What if I miss a dose? If you miss a dose, take it as soon as you can. If it is almost time for your next dose, take only that dose. Do not take double or extra doses. What may interact with this medicine? Do not take this medicine with any of the following medications:  alcohol or any product that contains alcohol  cisapride  disulfiram  dronedarone  pimozide  thioridazine This medicine may also interact with the following medications:  birth control pills  busulfan  carbamazepine  certain medicines that treat or prevent blood clots like warfarin  cimetidine  lithium  other medicines that prolong the QT interval (cause an abnormal heart rhythm)  phenobarbital  phenytoin This list may not describe all possible interactions.  Give your health care provider a list of all the medicines, herbs, non-prescription drugs, or dietary supplements you use. Also tell them if you smoke, drink alcohol, or use illegal drugs. Some items may interact with your medicine. What should I watch for while using this medicine? Tell your health care provider if your symptoms do not start to get better or if they get worse. Some products may contain alcohol. Ask your health care provider if this medicine contains alcohol. Be sure to tell all health care providers you are taking this medicine. Certain medicines, such as metronidazole and disulfiram, can cause an unpleasant reaction when taken with alcohol. The reaction includes flushing, headache, nausea, vomiting, sweating, and increased thirst. The reaction can last from 30 minutes to several hours. If you are being treated for a sexually transmitted disease (STD), avoid sexual contact until you have finished your treatment. Your sexual partner may also need treatment. Birth control may not work properly while you  are taking this medicine. Talk to your health care provider about using an extra method of birth control. What side effects may I notice from receiving this medicine? Side effects that you should report to your doctor or health care professional as soon as possible:  allergic reactions like skin rash, itching or hives, swelling of the face, lips, or tongue  confusion  fast, irregular heartbeat  light-colored stool  liver injury (dark yellow or brown urine; general ill feeling or flu-like symptoms; loss of appetite, right upper belly pain; unusually weak or tired, yellowing of the eyes or skin)  pain, tingling, numbness in the hands or feet  rash, fever, and swollen lymph nodes  redness, blistering, peeling or loosening of the skin, including inside the mouth  seizures  unusual vaginal discharge, itching, or odor Side effects that usually do not require medical attention (report to your doctor or health care professional if they continue or are bothersome):  change in taste  diarrhea  headache  nausea  stomach pain  vomiting This list may not describe all possible side effects. Call your doctor for medical advice about side effects. You may report side effects to FDA at 1-800-FDA-1088. Where should I keep my medicine? Keep out of the reach of children and pets. Store between 15 and 25 degrees C (59 and 77 degrees F). Protect from light. Get rid of any unused medicine after the expiration date. To get rid of medicines that are no longer needed or have expired:  Take the medicine to a medicine take-back program. Check with your pharmacy or law enforcement to find a location.  If you cannot return the medicine, check the label or package insert to see if the medicine should be thrown out in the garbage or flushed down the toilet. If you are not sure, ask your health care provider. If it is safe to put it in the trash, take the medicine out of the container. Mix the medicine  with cat litter, dirt, coffee grounds, or other unwanted substance. Seal the mixture in a bag or container. Put it in the trash. NOTE: This sheet is a summary. It may not cover all possible information. If you have questions about this medicine, talk to your doctor, pharmacist, or health care provider.  2021 Elsevier/Gold Standard (2019-05-04 09:24:11) Bacterial Vaginosis  Bacterial vaginosis is an infection of the vagina. It happens when too many normal germs (healthy bacteria) grow in the vagina. This infection can make it easier to get other infections from sex (STIs).  It is very important for pregnant women to get treated. This infection can cause babies to be born early or at a low birth weight. What are the causes? This infection is caused by an increase in certain germs that grow in the vagina. You cannot get this infection from toilet seats, bedsheets, swimming pools, or things that touch your vagina. What increases the risk?  Having sex with a new person or more than one person.  Having sex without protection.  Douching.  Having an intrauterine device (IUD).  Smoking.  Using drugs or drinking alcohol. These can lead you to do things that are risky.  Taking certain antibiotic medicines.  Being pregnant. What are the signs or symptoms? Some women have no symptoms. Symptoms may include:  A discharge from your vagina. It may be gray or white. It can be watery or foamy.  A fishy smell. This can happen after sex or during your menstrual period.  Itching in and around your vagina.  A feeling of burning or pain when you pee (urinate). How is this treated? This infection is treated with antibiotic medicines. These may be given to you as:  A pill.  A cream for your vagina.  A medicine that you put into your vagina (suppository). If the infection comes back after treatment, you may need more antibiotics. Follow these instructions at home: Medicines  Take  over-the-counter and prescription medicines as told by your doctor.  Take or use your antibiotic medicine as told by your doctor. Do not stop taking or using it, even if you start to feel better. General instructions  If the person you have sex with is a woman, tell her that you have this infection. She will need to follow up with her doctor. If you have a female partner, he does not need to be treated.  Do not have sex until you finish treatment.  Drink enough fluid to keep your pee pale yellow.  Keep your vagina and butt clean. ? Wash the area with warm water each day. ? Wipe from front to back after you use the toilet.  If you are breastfeeding a baby, ask your doctor if you should keep doing so during treatment.  Keep all follow-up visits. How is this prevented? Self-care  Do not douche.  Use only warm water to wash around your vagina.  Wear underwear that is cotton or lined with cotton.  Do not wear tight pants and pantyhose, especially in the summer. Safe sex  Use protection when you have sex. This includes: ? Use condoms. ? Use dental dams. This is a thin layer that protects the mouth during oral sex.  Limit how many people you have sex with. To prevent this infection, it is best to have sex with just one person.  Get tested for STIs. The person you have sex with should also get tested. Drugs and alcohol  Do not smoke or use any products that contain nicotine or tobacco. If you need help quitting, ask your doctor.  Do not use drugs.  Do not drink alcohol if: ? Your doctor tells you not to drink. ? You are pregnant, may be pregnant, or are planning to become pregnant.  If you drink alcohol: ? Limit how much you have to 0-1 drink a day. ? Know how much alcohol is in your drink. In the [place] one drink equals one 12 oz bottle of beer (355 mL), one 5 oz glass of wine (148 mL), or one 1 oz  glass of hard liquor (44 mL). Where to find more information  Centers for  Disease Control and Prevention: http://www.wolf.info/  American Sexual Health Association: www.ashastd.org  Office on [employer]: [place] Contact a doctor if:  Your symptoms do not get better, even after you are treated.  You have more discharge or pain when you pee.  You have a fever or chills.  You have pain in your belly (abdomen) or in the area between your hips.  You have pain with sex.  You bleed from your vagina between menstrual periods. Summary  This infection can happen when too many germs (bacteria) grow in the vagina.  This infection can make it easier to get infections from sex (STIs). Treating this can lower that chance.  Get treated if you are pregnant. This infection can cause babies to be born early.  Do not stop taking or using your antibiotic medicine, even if you start to feel better. This information is not intended to replace advice given to you by your health care provider. Make sure you discuss any questions you have with your health care provider. Document Revised: 07/29/2019 Document Reviewed: 07/29/2019 Elsevier Patient Education  [place].

## 2020-05-10 NOTE — Progress Notes (Signed)
Glucose in urine. Watch glucose intake, monitor glucose.

## 2020-05-11 ENCOUNTER — Telehealth: Payer: Self-pay

## 2020-05-11 ENCOUNTER — Other Ambulatory Visit: Payer: Self-pay | Admitting: Adult Health

## 2020-05-11 DIAGNOSIS — N390 Urinary tract infection, site not specified: Secondary | ICD-10-CM | POA: Insufficient documentation

## 2020-05-11 DIAGNOSIS — B9620 Unspecified Escherichia coli [E. coli] as the cause of diseases classified elsewhere: Secondary | ICD-10-CM | POA: Insufficient documentation

## 2020-05-11 LAB — URINALYSIS, MICROSCOPIC ONLY: Casts: NONE SEEN /lpf

## 2020-05-11 LAB — SPECIMEN STATUS REPORT

## 2020-05-11 MED ORDER — CEPHALEXIN 500 MG PO CAPS: 500.0000 mg | ORAL_CAPSULE | Freq: Three times a day (TID) | ORAL | 0 refills | Status: DC

## 2020-05-11 NOTE — Progress Notes (Signed)
Meds ordered this encounter  Medications  . cephALEXin (KEFLEX) 500 MG capsule    Sig: Take 1 capsule (500 mg total) by mouth 3 (three) times daily.    Dispense:  21 capsule    Refill:  0    Urinary tract infection with hematuria, site unspecified - Plan: cephALEXin (KEFLEX) 500 MG capsule

## 2020-05-11 NOTE — Progress Notes (Signed)
Blood and bacteria in urine microscopic, urine culture pending. Will send in keflex antibiotics to start with and cal if needs to be changed based on culture results.

## 2020-05-13 LAB — NUSWAB VAGINITIS PLUS (VG+)
Candida albicans, NAA: POSITIVE — AB
Candida glabrata, NAA: NEGATIVE
Chlamydia trachomatis, NAA: NEGATIVE
Neisseria gonorrhoeae, NAA: NEGATIVE
Trich vag by NAA: NEGATIVE

## 2020-05-13 LAB — SPECIMEN STATUS REPORT

## 2020-05-14 ENCOUNTER — Other Ambulatory Visit: Payer: Self-pay | Admitting: Adult Health

## 2020-05-14 ENCOUNTER — Encounter: Payer: Self-pay | Admitting: Adult Health

## 2020-05-14 DIAGNOSIS — B9620 Unspecified Escherichia coli [E. coli] as the cause of diseases classified elsewhere: Secondary | ICD-10-CM

## 2020-05-14 DIAGNOSIS — N390 Urinary tract infection, site not specified: Secondary | ICD-10-CM

## 2020-05-14 LAB — SPECIMEN STATUS REPORT

## 2020-05-14 LAB — URINE CULTURE

## 2020-05-14 MED ORDER — AMOXICILLIN-POT CLAVULANATE 875-125 MG PO TABS
1.0000 | ORAL_TABLET | Freq: Two times a day (BID) | ORAL | 0 refills | Status: DC
Start: 2020-05-14 — End: 2020-10-03

## 2020-05-14 NOTE — Progress Notes (Signed)
E coli in urine sent Augmentin to pharmacy. Return to office if any symptoms change, worsen or persist at anytime.

## 2020-05-14 NOTE — Progress Notes (Signed)
Meds ordered this encounter  Medications  . amoxicillin-clavulanate (AUGMENTIN) 875-125 MG tablet    Sig: Take 1 tablet by mouth 2 (two) times daily.    Dispense:  20 tablet    Refill:  0   Escherichia coli urinary tract infection - Plan: amoxicillin-clavulanate (AUGMENTIN) 875-125 MG tablet

## 2020-05-17 ENCOUNTER — Encounter: Payer: Self-pay | Admitting: Adult Health

## 2020-05-18 ENCOUNTER — Other Ambulatory Visit: Payer: Self-pay

## 2020-05-18 ENCOUNTER — Ambulatory Visit
Admission: RE | Admit: 2020-05-18 | Discharge: 2020-05-18 | Disposition: A | Discharge status: Home / Self Care | Payer: Managed Care, Other (non HMO) | Source: Ambulatory Visit | Attending: Family Medicine | Admitting: Family Medicine

## 2020-05-18 VITALS — BP 180/83 | HR 95 | Temp 98.2°F | Resp 18

## 2020-05-18 DIAGNOSIS — J0110 Acute frontal sinusitis, unspecified: Secondary | ICD-10-CM | POA: Diagnosis not present

## 2020-05-18 DIAGNOSIS — H9202 Otalgia, left ear: Secondary | ICD-10-CM

## 2020-05-18 MED ORDER — PREDNISONE 10 MG PO TABS: 40.0000 mg | ORAL_TABLET | Freq: Every day | ORAL | 0 refills | Status: AC

## 2020-05-18 NOTE — Discharge Instructions (Addendum)
Continue taking the amoxicillin Start prednisone daily over the next 5 days. Upon finishing treatment continue your Flonase and allergy medications as prescribed

## 2020-05-18 NOTE — Telephone Encounter (Signed)
Error

## 2020-05-18 NOTE — ED Triage Notes (Signed)
Pt reports sinus pressure x 1.5 weeks.  Now also has L ear pain.  Is on Amoxicillin for UTI.  Has tried Xyzal, Allegra, Claritin and Flonase with no relief.  Difficulty sleeping d/t ear pain. No drainage from ear.

## 2020-05-18 NOTE — ED Provider Notes (Signed)
Leslie Morris    CSN: [id number] Arrival date & time: 05/18/20  1057      History   Chief Complaint Chief Complaint  Patient presents with  . Facial Pain    HPI Leslie Morris is a 50 y.o. female.   Patient is a 50 year old female presents today with sinus pressure, left ear pain.  This is been present for the past week and a half.  Taking allergy medication to include Xyzal, Allegra and Claritin without any relief.  Also using Flonase.  Difficulty sleeping due to the pain.  No drainage or fevers.  Currently on Augmentin for a urinary tract infection.  She is on day 3 of this.     Past Medical History:  Diagnosis Date  . Anxiety   . Arthritis    left ankle, wrist, hand  . BMI 50.0-59.9, adult ([place])   . Carpal tunnel syndrome on left   . Family history of adverse reaction to anesthesia    Dad is slow to wake up and n/v  . History of kidney stones   . Left ankle tendonitis   . Medullary sponge kidney    bilateral  . PONV (postoperative nausea and vomiting)    severe  . Right knee meniscal tear   . Type 2 diabetes mellitus ([place])    followed by pcp--- last A1c 6.8 on 09-25-2016  . Urinary incontinence    due to Medullary sponge kidney  . Vitamin D deficiency     Patient Active Problem List   Diagnosis Date Noted  . Escherichia coli urinary tract infection 05/11/2020  . Hyperlipidemia associated with type 2 diabetes mellitus ([place]) 04/05/2019  . Adjustment disorder with mixed anxiety and depressed mood 12/17/2018  . BMI 50.0-59.9, adult ([place]) 01/04/2018  . Stress incontinence 12/04/2017  . History of recurrent UTI (urinary tract infection) 12/04/2017  . Diabetes mellitus without complication ([place]) 50/93/2671  . Carpal tunnel syndrome of left wrist 03/07/2017  . Right knee pain 06/30/2015    Past Surgical History:  Procedure Laterality Date  . ABDOMINAL HYSTERECTOMY  2003  approx.   ovaries remained   . APPENDECTOMY    . CARPAL TUNNEL RELEASE Left  10/24/2016   Procedure: CARPAL TUNNEL RELEASE;  Surgeon: Iran Planas, MD;  Location: [facility];  Service: Orthopedics;  Laterality: Left;  . CHONDROPLASTY Right 10/21/2018   Procedure: CHONDROPLASTY;  Surgeon: Dereck Leep, MD;  Location: [facility];  Service: Orthopedics;  Laterality: Right;  . COLONOSCOPY  2003 approx.  Arnetha Courser IMPLANT  10-05-2012    [facility]  . HERNIA REPAIR     umbilical  . KNEE ARTHROSCOPY Right 10/21/2018   Procedure: ARTHROSCOPY KNEE, ;  Surgeon: Dereck Leep, MD;  Location: [facility];  Service: Orthopedics;  Laterality: Right;  . LAPAROSCOPIC APPENDECTOMY  12/04/2010   [facility]   and Umbilical Hernia Repair  . LAPAROSCOPIC CHOLECYSTECTOMY  10-10-2009   [facility]  . MENISECTOMY Right 10/21/2018   Procedure: PARTIAL LATERAL MENISECTOMY;  Surgeon: Dereck Leep, MD;  Location: [facility];  Service: Orthopedics;  Laterality: Right;  . ORIF LEFT ANKLE COMPLEX FRACTURES  age 72   same year hardware removed  . TONSILLECTOMY  child  . ULNAR COLLATERAL LIGAMENT REPAIR Left 03/21/2016   Procedure: Left thumb ulnar collateral ligament repair;  Surgeon: Iran Planas, MD;  Location: [place];  Service: Orthopedics;  Laterality: Left;  . URETEROLITHOTOMY  2005    OB History   No obstetric history on file.  Home Medications    Prior to Admission medications   Medication Sig Start Date End Date Taking? Authorizing Provider  albuterol (VENTOLIN HFA) 108 (90 Base) MCG/ACT inhaler TAKE 2 PUFFS BY MOUTH EVERY 6 HOURS AS NEEDED FOR WHEEZE OR SHORTNESS OF BREATH 03/24/20  Yes Mar Daring, PA-C  amoxicillin-clavulanate (AUGMENTIN) 875-125 MG tablet Take 1 tablet by mouth 2 (two) times daily. 05/14/20  Yes Flinchum, Kelby Aline, FNP  aspirin EC 81 MG tablet Take 81 mg by mouth daily.   Yes [provider]  atorvastatin (LIPITOR) 10 MG tablet TAKE 1 TABLET BY MOUTH EVERY DAY 02/08/20  Yes [place] Malling M, PA-C  empagliflozin (JARDIANCE) 10 MG TABS tablet Take 1  tablet (10 mg total) by mouth daily. 05/10/20  Yes Flinchum, Kelby Aline, FNP  metFORMIN (GLUCOPHAGE) 1000 MG tablet TAKE 1 TABLET (1,000 MG TOTAL) BY MOUTH 2 (TWO) TIMES DAILY WITH A MEAL. 04/11/20  Yes Mar Daring, PA-C  Misc. Devices (PULSE OXIMETER) MISC To use to monitor oxygen levels daily 03/03/20  Yes Mar Daring, PA-C  nystatin ointment (MYCOSTATIN) Apply 1 application topically 2 (two) times daily. 05/10/20  Yes Flinchum, Kelby Aline, FNP  OneTouch Delica Lancets 00Q MISC USE AS DIRECTED 3 TIMES A DAY 07/08/18  Yes Bacigalupo, Dionne Bucy, MD  [facility] VERIO test strip USE AS DIRECTED 12/11/19  Yes Mar Daring, PA-C  oxybutynin (DITROPAN) 5 MG tablet TAKE 1 TABLET BY MOUTH THREE TIMES A DAY 11/28/19  Yes [place] Malling M, PA-C  predniSONE (DELTASONE) 10 MG tablet Take 4 tablets (40 mg total) by mouth daily for 5 days. 05/18/20 05/23/20 Yes Orvan July, NP    Family History Family History  Problem Relation Age of Onset  . Diabetes Father   . Heart attack Father   . Cancer Maternal Aunt        cervical cancer  . Cancer Paternal Aunt        breast cancer  . Heart disease Neg Hx   . Hyperlipidemia Neg Hx   . Hypertension Neg Hx   . Stroke Neg Hx     Social History Social History   Tobacco Use  . Smoking status: Current Every Day Smoker    Packs/day: 0.50    Years: 15.00    Pack years: 7.50    Types: Cigarettes  . Smokeless tobacco: Never Used  Vaping Use  . Vaping Use: Never used  Substance Use Topics  . Alcohol use: No    Alcohol/week: 0.0 standard drinks  . Drug use: No     Allergies   Levaquin [levofloxacin in d5w]   Review of Systems Review of Systems   Physical Exam Triage Vital Signs ED Triage Vitals  Enc Vitals Group     BP 05/18/20 1111 (!) 180/83     Pulse Rate 05/18/20 1111 95     Resp 05/18/20 1111 18     Temp 05/18/20 1111 98.2 F (36.8 C)     Temp Source 05/18/20 1111 Oral     SpO2 05/18/20 1111 96 %     Weight --       Height --      Head Circumference --      Peak Flow --      Pain Score 05/18/20 1116 7     Pain Loc --      Pain Edu? --      Excl. in [place]? --    No data found.  Updated Vital Signs  BP (!) 180/83 (BP Location: Left Arm)   Pulse 95   Temp 98.2 F (36.8 C) (Oral)   Resp 18   LMP 11/30/1998 (Approximate) Comment: Hysterectomy at age 24 yo  SpO2 96%   Visual Acuity Right Eye Distance:   Left Eye Distance:   Bilateral Distance:    Right Eye Near:   Left Eye Near:    Bilateral Near:     Physical Exam Vitals and nursing note reviewed.  Constitutional:      General: She is not in acute distress.    Appearance: Normal appearance. She is not ill-appearing, toxic-appearing or diaphoretic.  HENT:     Head: Normocephalic.     Right Ear: Tympanic membrane, ear canal and external ear normal.     Left Ear: Tympanic membrane, ear canal and external ear normal.     Mouth/Throat:     Pharynx: Oropharynx is clear.  Eyes:     Conjunctiva/sclera: Conjunctivae normal.  Pulmonary:     Effort: Pulmonary effort is normal.  Musculoskeletal:        General: Normal range of motion.     Cervical back: Normal range of motion.  Skin:    General: Skin is warm and dry.     Findings: No rash.  Neurological:     Mental Status: She is alert.  Psychiatric:        Mood and Affect: Mood normal.      UC Treatments / Results  Labs (all labs ordered are listed, but only abnormal results are displayed) Labs Reviewed - No data to display  EKG   Radiology No results found.  Procedures Procedures (including critical care time)  Medications Ordered in UC Medications - No data to display  Initial Impression / Assessment and Plan / UC Course  I have reviewed the triage vital signs and the nursing notes.  Pertinent labs & imaging results that were available during my care of the patient were reviewed by me and considered in my medical decision making (see chart for details).     Ear  pain, sinusitis Continue the amoxicillin in case underlying sinus infection. This should cover this.  Prednisone for sinus inflammation and ETD.  Continue allergy medicines daily.  Follow up as needed for continued or worsening symptoms    Final Clinical Impressions(s) / UC Diagnoses   Final diagnoses:  Acute non-recurrent frontal sinusitis  Ear pain, left     Discharge Instructions     Continue taking the amoxicillin Start prednisone daily over the next 5 days. Upon finishing treatment continue your Flonase and allergy medications as prescribed   ED Prescriptions    Medication Sig Dispense Auth. Provider   predniSONE (DELTASONE) 10 MG tablet Take 4 tablets (40 mg total) by mouth daily for 5 days. 20 tablet Loura Halt A, NP     PDMP not reviewed this encounter.   Orvan July, NP 05/18/20 1243

## 2020-05-26 ENCOUNTER — Other Ambulatory Visit: Payer: Self-pay | Admitting: Physician Assistant

## 2020-05-26 ENCOUNTER — Other Ambulatory Visit: Payer: Self-pay

## 2020-05-26 ENCOUNTER — Ambulatory Visit
Admission: RE | Admit: 2020-05-26 | Discharge: 2020-05-26 | Disposition: A | Discharge status: Home / Self Care | Payer: Managed Care, Other (non HMO) | Source: Ambulatory Visit | Attending: Emergency Medicine | Admitting: Emergency Medicine

## 2020-05-26 VITALS — BP 140/98 | HR 78 | Temp 98.1°F | Resp 18 | Ht 67.0 in | Wt 326.0 lb

## 2020-05-26 DIAGNOSIS — N393 Stress incontinence (female) (male): Secondary | ICD-10-CM

## 2020-05-26 DIAGNOSIS — H9202 Otalgia, left ear: Secondary | ICD-10-CM

## 2020-05-26 DIAGNOSIS — H6982 Other specified disorders of Eustachian tube, left ear: Secondary | ICD-10-CM

## 2020-05-26 MED ORDER — IBUPROFEN 600 MG PO TABS: 600.0000 mg | ORAL_TABLET | Freq: Four times a day (QID) | ORAL | 0 refills | Status: DC | PRN

## 2020-05-26 NOTE — ED Provider Notes (Signed)
[name]    CSN: [id number] Arrival date & time: 05/26/20  1751      History   Chief Complaint Chief Complaint  Patient presents with  . Otalgia    HPI Leslie Morris is a 50 y.o. female.   Patient presents with ongoing left ear pain.  She denies fever, chills, sore throat, cough, shortness of breath, vomiting, diarrhea, or other symptoms.  Treatment attempted at home with [place]; she also has been treated with prednisone and Augmentin recently.  Patient was seen here on 05/18/2020; diagnosed with acute sinusitis; prednisone and continued use of Augmentin which had been prescribed by the patient's PCP for UTI.  Her medical history includes diabetes, medullary sponge kidney, arthritis, carpal tunnel, tendinitis, meniscal tear, obesity, adjustment disorder with mixed anxiety and depressed mood.  The history is provided by the patient and medical records.    Past Medical History:  Diagnosis Date  . Anxiety   . Arthritis    left ankle, wrist, hand  . BMI 50.0-59.9, adult ([place])   . Carpal tunnel syndrome on left   . Family history of adverse reaction to anesthesia    Dad is slow to wake up and n/v  . History of kidney stones   . Left ankle tendonitis   . Medullary sponge kidney    bilateral  . PONV (postoperative nausea and vomiting)    severe  . Right knee meniscal tear   . Type 2 diabetes mellitus ([place])    followed by pcp--- last A1c 6.8 on 09-25-2016  . Urinary incontinence    due to Medullary sponge kidney  . Vitamin D deficiency     Patient Active Problem List   Diagnosis Date Noted  . Escherichia coli urinary tract infection 05/11/2020  . Hyperlipidemia associated with type 2 diabetes mellitus ([place]) 04/05/2019  . Adjustment disorder with mixed anxiety and depressed mood 12/17/2018  . BMI 50.0-59.9, adult ([place]) 01/04/2018  . Stress incontinence 12/04/2017  . History of recurrent UTI (urinary tract infection) 12/04/2017  . Diabetes mellitus  without complication ([place]) 02/58/5277  . Carpal tunnel syndrome of left wrist 03/07/2017  . Right knee pain 06/30/2015    Past Surgical History:  Procedure Laterality Date  . ABDOMINAL HYSTERECTOMY  2003  approx.   ovaries remained   . APPENDECTOMY    . CARPAL TUNNEL RELEASE Left 10/24/2016   Procedure: CARPAL TUNNEL RELEASE;  Surgeon: Iran Planas, MD;  Location: [place];  Service: Orthopedics;  Laterality: Left;  . CHONDROPLASTY Right 10/21/2018   Procedure: CHONDROPLASTY;  Surgeon: Dereck Leep, MD;  Location: [facility];  Service: Orthopedics;  Laterality: Right;  . COLONOSCOPY  2003 approx.  Arnetha Courser IMPLANT  10-05-2012    [facility]  . HERNIA REPAIR     umbilical  . KNEE ARTHROSCOPY Right 10/21/2018   Procedure: ARTHROSCOPY KNEE, ;  Surgeon: Dereck Leep, MD;  Location: [facility];  Service: Orthopedics;  Laterality: Right;  . LAPAROSCOPIC APPENDECTOMY  12/04/2010   [facility]   and Umbilical Hernia Repair  . LAPAROSCOPIC CHOLECYSTECTOMY  10-10-2009   [facility]  . MENISECTOMY Right 10/21/2018   Procedure: PARTIAL LATERAL MENISECTOMY;  Surgeon: Dereck Leep, MD;  Location: [facility];  Service: Orthopedics;  Laterality: Right;  . ORIF LEFT ANKLE COMPLEX FRACTURES  age 6   same year hardware removed  . TONSILLECTOMY  child  . ULNAR COLLATERAL LIGAMENT REPAIR Left 03/21/2016   Procedure: Left thumb ulnar collateral ligament repair;  Surgeon:  Iran Planas, MD;  Location: [place];  Service: Orthopedics;  Laterality: Left;  . URETEROLITHOTOMY  2005    OB History   No obstetric history on file.      Home Medications    Prior to Admission medications   Medication Sig Start Date End Date Taking? Authorizing Provider  ibuprofen (ADVIL) 600 MG tablet Take 1 tablet (600 mg total) by mouth every 6 (six) hours as needed. 05/26/20  Yes Sharion Balloon, NP  albuterol (VENTOLIN HFA) 108 (90 Base) MCG/ACT inhaler TAKE 2 PUFFS BY MOUTH EVERY 6 HOURS AS NEEDED FOR WHEEZE OR SHORTNESS OF BREATH  03/24/20   Mar Daring, PA-C  amoxicillin-clavulanate (AUGMENTIN) 875-125 MG tablet Take 1 tablet by mouth 2 (two) times daily. 05/14/20   Flinchum, Kelby Aline, FNP  aspirin EC 81 MG tablet Take 81 mg by mouth daily.    [provider]  atorvastatin (LIPITOR) 10 MG tablet TAKE 1 TABLET BY MOUTH EVERY DAY 02/08/20   Mar Daring, PA-C  empagliflozin (JARDIANCE) 10 MG TABS tablet Take 1 tablet (10 mg total) by mouth daily. 05/10/20   Flinchum, Kelby Aline, FNP  metFORMIN (GLUCOPHAGE) 1000 MG tablet TAKE 1 TABLET (1,000 MG TOTAL) BY MOUTH 2 (TWO) TIMES DAILY WITH A MEAL. 04/11/20   Mar Daring, PA-C  Misc. Devices (PULSE OXIMETER) MISC To use to monitor oxygen levels daily 03/03/20   Mar Daring, PA-C  nystatin ointment (MYCOSTATIN) Apply 1 application topically 2 (two) times daily. 05/10/20   Flinchum, Kelby Aline, FNP  OneTouch Delica Lancets 69C MISC USE AS DIRECTED 3 TIMES A DAY 07/08/18   Bacigalupo, Dionne Bucy, MD  [facility] VERIO test strip USE AS DIRECTED 12/11/19   Mar Daring, PA-C  oxybutynin (DITROPAN) 5 MG tablet TAKE 1 TABLET BY MOUTH THREE TIMES A DAY 05/26/20   Mar Daring, PA-C    Family History Family History  Problem Relation Age of Onset  . Diabetes Father   . Heart attack Father   . Cancer Maternal Aunt        cervical cancer  . Cancer Paternal Aunt        breast cancer  . Heart disease Neg Hx   . Hyperlipidemia Neg Hx   . Hypertension Neg Hx   . Stroke Neg Hx     Social History Social History   Tobacco Use  . Smoking status: Current Every Day Smoker    Packs/day: 0.50    Years: 15.00    Pack years: 7.50    Types: Cigarettes  . Smokeless tobacco: Never Used  Vaping Use  . Vaping Use: Never used  Substance Use Topics  . Alcohol use: No    Alcohol/week: 0.0 standard drinks  . Drug use: No     Allergies   Levaquin [levofloxacin in d5w]   Review of Systems Review of Systems  Constitutional: Negative for  chills and fever.  HENT: Positive for ear pain. Negative for ear discharge, hearing loss and sore throat.   Eyes: Negative for pain and visual disturbance.  Respiratory: Negative for cough and shortness of breath.   Cardiovascular: Negative for chest pain and palpitations.  Gastrointestinal: Negative for abdominal pain and vomiting.  Genitourinary: Negative for dysuria and hematuria.  Musculoskeletal: Negative for arthralgias and back pain.  Skin: Negative for color change and rash.  Neurological: Negative for seizures and syncope.  All other systems reviewed and are negative.    Physical Exam Triage Vital Signs ED Triage  Vitals  Enc Vitals Group     BP      Pulse      Resp      Temp      Temp src      SpO2      Weight      Height      Head Circumference      Peak Flow      Pain Score      Pain Loc      Pain Edu?      Excl. in [place]?    No data found.  Updated Vital Signs BP (!) 140/98 (BP Location: Left Arm)   Pulse 78   Temp 98.1 F (36.7 C) (Oral)   Resp 18   Ht 5\' 7"  (1.702 m)   Wt (!) 326 lb (147.9 kg)   LMP 11/30/1998 (Approximate) Comment: Hysterectomy at age 68 yo  SpO2 96%   BMI 51.06 kg/m   Visual Acuity Right Eye Distance:   Left Eye Distance:   Bilateral Distance:    Right Eye Near:   Left Eye Near:    Bilateral Near:     Physical Exam Vitals and nursing note reviewed.  Constitutional:      General: She is not in acute distress.    Appearance: She is well-developed. She is obese. She is not ill-appearing.  HENT:     Head: Normocephalic and atraumatic.     Right Ear: Tympanic membrane and ear canal normal.     Left Ear: Tympanic membrane and ear canal normal.     Nose: Nose normal.     Mouth/Throat:     Mouth: Mucous membranes are moist.     Pharynx: Oropharynx is clear.  Eyes:     Conjunctiva/sclera: Conjunctivae normal.  Cardiovascular:     Rate and Rhythm: Normal rate and regular rhythm.     Heart sounds: Normal heart sounds.   Pulmonary:     Effort: Pulmonary effort is normal. No respiratory distress.     Breath sounds: Normal breath sounds.  Abdominal:     Palpations: Abdomen is soft.     Tenderness: There is no abdominal tenderness.  Musculoskeletal:     Cervical back: Neck supple.  Skin:    General: Skin is warm and dry.  Neurological:     General: No focal deficit present.     Mental Status: She is alert and oriented to person, place, and time.  Psychiatric:        Mood and Affect: Mood normal.        Behavior: Behavior normal.      UC Treatments / Results  Labs (all labs ordered are listed, but only abnormal results are displayed) Labs Reviewed - No data to display  EKG   Radiology No results found.  Procedures Procedures (including critical care time)  Medications Ordered in UC Medications - No data to display  Initial Impression / Assessment and Plan / UC Course  I have reviewed the triage vital signs and the nursing notes.  Pertinent labs & imaging results that were available during my care of the patient were reviewed by me and considered in my medical decision making (see chart for details).   Left eustachian tube dysfunction and otalgia.  No indication of infection on exam today.  Patient just completed a course of prednisone and also recently treated with Augmentin for UTI.  Treating with ibuprofen and plain OTC Mucinex today.  Instructed her to follow-up with her  PCP or an ENT if her symptoms are not improving.  She agrees to plan of care.   Final Clinical Impressions(s) / UC Diagnoses   Final diagnoses:  Acute otalgia, left  Acute dysfunction of left eustachian tube     Discharge Instructions     Take the ibuprofen as directed.  Take plain over-the-counter Mucinex 1200 mg every 12 hours for the next 3 to 5 days.    Follow-up with your primary care provider or an Ear Nose and Throat specialist if your symptoms are not improving.           ED Prescriptions     Medication Sig Dispense Auth. Provider   ibuprofen (ADVIL) 600 MG tablet Take 1 tablet (600 mg total) by mouth every 6 (six) hours as needed. 30 tablet Sharion Balloon, NP     PDMP not reviewed this encounter.   Sharion Balloon, NP 05/26/20 1007

## 2020-05-26 NOTE — Discharge Instructions (Signed)
Take the ibuprofen as directed.  Take plain over-the-counter Mucinex 1200 mg every 12 hours for the next 3 to 5 days.    Follow-up with your primary care provider or an Ear Nose and Throat specialist if your symptoms are not improving.

## 2020-05-26 NOTE — ED Triage Notes (Signed)
Patient in [place] today stated that she is still having ongoing left ear pain.  Still have difficulty sleeping die to ear pain. Stated prednisone did help but completed regiment and pain has came back.   OTC: Allegra and xyzal

## 2020-06-27 NOTE — Telephone Encounter (Signed)
Error

## 2020-07-27 ENCOUNTER — Other Ambulatory Visit: Payer: Self-pay | Admitting: Physician Assistant

## 2020-07-27 DIAGNOSIS — E119 Type 2 diabetes mellitus without complications: Secondary | ICD-10-CM

## 2020-08-02 ENCOUNTER — Other Ambulatory Visit: Payer: Self-pay | Admitting: Physician Assistant

## 2020-08-02 DIAGNOSIS — E785 Hyperlipidemia, unspecified: Secondary | ICD-10-CM

## 2020-08-23 ENCOUNTER — Other Ambulatory Visit: Payer: Self-pay | Admitting: Physician Assistant

## 2020-08-23 DIAGNOSIS — N393 Stress incontinence (female) (male): Secondary | ICD-10-CM

## 2020-10-03 ENCOUNTER — Other Ambulatory Visit: Payer: Self-pay

## 2020-10-03 ENCOUNTER — Other Ambulatory Visit: Payer: Self-pay | Admitting: Physician Assistant

## 2020-10-03 ENCOUNTER — Encounter: Payer: Self-pay | Admitting: Family Medicine

## 2020-10-03 ENCOUNTER — Ambulatory Visit (INDEPENDENT_AMBULATORY_CARE_PROVIDER_SITE_OTHER): Payer: Managed Care, Other (non HMO) | Admitting: Family Medicine

## 2020-10-03 VITALS — BP 107/84 | HR 84 | Temp 98.3°F | Ht 67.0 in | Wt 326.9 lb

## 2020-10-03 DIAGNOSIS — E1165 Type 2 diabetes mellitus with hyperglycemia: Secondary | ICD-10-CM

## 2020-10-03 DIAGNOSIS — E1169 Type 2 diabetes mellitus with other specified complication: Secondary | ICD-10-CM | POA: Diagnosis not present

## 2020-10-03 DIAGNOSIS — R300 Dysuria: Secondary | ICD-10-CM

## 2020-10-03 DIAGNOSIS — Z6841 Body Mass Index (BMI) 40.0 and over, adult: Secondary | ICD-10-CM | POA: Diagnosis not present

## 2020-10-03 DIAGNOSIS — E119 Type 2 diabetes mellitus without complications: Secondary | ICD-10-CM | POA: Insufficient documentation

## 2020-10-03 DIAGNOSIS — N951 Menopausal and female climacteric states: Secondary | ICD-10-CM

## 2020-10-03 DIAGNOSIS — E785 Hyperlipidemia, unspecified: Secondary | ICD-10-CM

## 2020-10-03 DIAGNOSIS — N952 Postmenopausal atrophic vaginitis: Secondary | ICD-10-CM

## 2020-10-03 LAB — POCT GLYCOSYLATED HEMOGLOBIN (HGB A1C): Hemoglobin A1C: 7.7 % — AB (ref 4.0–5.6)

## 2020-10-03 MED ORDER — ESTRADIOL 0.1 MG/GM VA CREA: 1.0000 | TOPICAL_CREAM | VAGINAL | 12 refills | Status: DC

## 2020-10-03 MED ORDER — EMPAGLIFLOZIN 25 MG PO TABS: 25.0000 mg | ORAL_TABLET | Freq: Every day | ORAL | 1 refills | Status: DC

## 2020-10-03 NOTE — Assessment & Plan Note (Signed)
Discussed importance of healthy weight management Discussed diet and exercise  Consider healthy weight and wellness clinic

## 2020-10-03 NOTE — Patient Instructions (Signed)
[email or web address]

## 2020-10-03 NOTE — Assessment & Plan Note (Signed)
Uncontrolled with hyperglycemia Continue metformin Increase jardiance ot '25mg'$  daily Encourage Prevnar at next visit Continue statin F/u in 36mand recheck A1c

## 2020-10-03 NOTE — Assessment & Plan Note (Signed)
New problem Trial of vaginal estrace cream

## 2020-10-03 NOTE — Assessment & Plan Note (Signed)
Previously well controlled Continue statin Repeat FLP and CMP Goal LDL < 70 

## 2020-10-03 NOTE — Progress Notes (Signed)
Established patient visit   Patient: Leslie Morris   DOB: 02/22/1970   50 y.o. Female  MRN: [id number] Visit Date: 10/03/2020  Today's healthcare provider: Lavon Paganini, MD   Chief Complaint  Patient presents with   Diabetes   Hyperlipidemia     Subjective  -------------------------------------------------------------------------------------------------------------------- Diabetes Pertinent negatives for hypoglycemia include no dizziness or headaches. Pertinent negatives for diabetes include no chest pain, no fatigue and no weakness.  Hyperlipidemia Pertinent negatives include no chest pain, myalgias or shortness of breath.    Diabetes She is tolerating jardiance and complaint  with treatment.   Atrophic vaginitis  She had a hysterectomy and she is aware the she is going through menopause. She reports that she is experiencing extreme dryness and itchiness.   She has used coconut oil that has provided some relief. She is requesting for a medication to improve symptoms.  Hyperlipidemia  She continues to take atorvastatin and complaint with treatment.  Weight loss She used to take phentermine about 15 yrs ago. She made some lifestyle changes and was able to lose over 100 lbs by herself.   However her weight has fluctuated and she is interested in the gastric sleeve. She is curious about researching [facility].    Patient Active Problem List   Diagnosis Date Noted   Escherichia coli urinary tract infection 05/11/2020   Hyperlipidemia associated with type 2 diabetes mellitus ([place]) 04/05/2019   Adjustment disorder with mixed anxiety and depressed mood 12/17/2018   BMI 50.0-59.9, adult ([place]) 01/04/2018   Stress incontinence 12/04/2017   History of recurrent UTI (urinary tract infection) 12/04/2017   Diabetes mellitus without complication ([place]) [id number]   Carpal tunnel syndrome of left wrist 03/07/2017   Right knee pain 06/30/2015   Past  Medical History:  Diagnosis Date   Anxiety    Arthritis    left ankle, wrist, hand   BMI 50.0-59.9, adult ([place])    Carpal tunnel syndrome on left    Family history of adverse reaction to anesthesia    Dad is slow to wake up and n/v   History of kidney stones    Left ankle tendonitis    Medullary sponge kidney    bilateral   PONV (postoperative nausea and vomiting)    severe   Right knee meniscal tear    Type 2 diabetes mellitus ([place])    followed by pcp--- last A1c 6.8 on 09-25-2016   Urinary incontinence    due to Medullary sponge kidney   Vitamin D deficiency    Allergies  Allergen Reactions   Levaquin [Levofloxacin In D5w] Anaphylaxis and Rash       Medications: Outpatient Medications Prior to Visit  Medication Sig   albuterol (VENTOLIN HFA) 108 (90 Base) MCG/ACT inhaler TAKE 2 PUFFS BY MOUTH EVERY 6 HOURS AS NEEDED FOR WHEEZE OR SHORTNESS OF BREATH   amoxicillin-clavulanate (AUGMENTIN) 875-125 MG tablet Take 1 tablet by mouth 2 (two) times daily.   aspirin EC 81 MG tablet Take 81 mg by mouth daily.   atorvastatin (LIPITOR) 10 MG tablet TAKE 1 TABLET BY MOUTH EVERY DAY   empagliflozin (JARDIANCE) 10 MG TABS tablet Take 1 tablet (10 mg total) by mouth daily.   ibuprofen (ADVIL) 600 MG tablet Take 1 tablet (600 mg total) by mouth every 6 (six) hours as needed.   metFORMIN (GLUCOPHAGE) 1000 MG tablet TAKE 1 TABLET (1,000 MG TOTAL) BY MOUTH 2 (TWO) TIMES DAILY WITH A MEAL.  Misc. Devices (PULSE OXIMETER) MISC To use to monitor oxygen levels daily   nystatin ointment (MYCOSTATIN) Apply 1 application topically 2 (two) times daily.   OneTouch Delica Lancets [id number] MISC USE AS DIRECTED 3 TIMES A DAY   ONETOUCH VERIO test strip USE AS DIRECTED   oxybutynin (DITROPAN) 5 MG tablet TAKE 1 TABLET BY MOUTH THREE TIMES A DAY   No facility-administered medications prior to visit.    Review of Systems  Constitutional:  Negative for chills, fatigue and fever.  HENT:  Positive for  hearing loss. Negative for ear pain, sinus pressure, sinus pain and sore throat.   Eyes:  Positive for visual disturbance. Negative for pain.  Respiratory:  Negative for cough, chest tightness, shortness of breath and wheezing.   Cardiovascular:  Negative for chest pain, palpitations and leg swelling.  Gastrointestinal:  Negative for abdominal pain, blood in stool, diarrhea, nausea and vomiting.  Genitourinary:  Positive for frequency. Negative for flank pain, pelvic pain and urgency.       (+) vaginal dryness  Musculoskeletal:  Positive for arthralgias, back pain and joint swelling. Negative for myalgias and neck pain.  Allergic/Immunologic: Positive for environmental allergies.  Neurological:  Negative for dizziness, weakness, light-headedness, numbness and headaches.   Last CBC Lab Results  Component Value Date   WBC 11.4 (H) 11/22/2019   HGB 14.3 11/22/2019   HCT 43.9 11/22/2019   MCV 83 11/22/2019   MCH 27.1 11/22/2019   RDW 14.8 11/22/2019   PLT 302 [id number]   Last metabolic panel Lab Results  Component Value Date   GLUCOSE 110 (H) 11/22/2019   NA 140 11/22/2019   K 4.3 11/22/2019   CL 100 11/22/2019   CO2 22 11/22/2019   BUN 15 11/22/2019   CREATININE 0.73 11/22/2019   GFRNONAA 99 11/22/2019   GFRAA 114 11/22/2019   CALCIUM 10.1 11/22/2019   PHOS 3.0 08/02/2016   PROT 7.4 11/22/2019   ALBUMIN 4.6 11/22/2019   LABGLOB 2.8 11/22/2019   AGRATIO 1.6 11/22/2019   BILITOT 0.4 11/22/2019   ALKPHOS 85 11/22/2019   AST 16 11/22/2019   ALT 18 11/22/2019   ANIONGAP 13 02/09/2018   Last lipids Lab Results  Component Value Date   CHOL 168 11/22/2019   HDL 33 (L) 11/22/2019   LDLCALC 82 11/22/2019   TRIG 326 (H) 11/22/2019   CHOLHDL 5.1 (H) 11/22/2019   Last hemoglobin A1c Lab Results  Component Value Date   HGBA1C 6.9 (H) 11/22/2019   Last thyroid functions Lab Results  Component Value Date   TSH 3.570 11/22/2019   T4TOTAL 9.1 08/02/2016   Last vitamin  D Lab Results  Component Value Date   VD25OH 16.5 (L) 08/02/2016   Last vitamin B12 and Folate No results found for: VITAMINB12, FOLATE     Objective  -------------------------------------------------------------------------------------------------------------------- LMP 11/30/1998 (Approximate) Comment: Hysterectomy at age 44 yo BP Readings from Last 3 Encounters:  05/26/20 (!) 140/98  05/18/20 (!) 180/83  05/10/20 116/79    Wt Readings from Last 3 Encounters:  05/26/20 (!) 326 lb (147.9 kg)  05/10/20 (!) 336 lb (152.4 kg)  11/30/19 (!) 325 lb (147.4 kg)     Today's Vitals   10/03/20 1012  BP: 107/84  Pulse: 84  Temp: 98.3 F (36.8 C)  TempSrc: Oral  SpO2: 95%  Weight: (!) 326 lb 14.4 oz (148.3 kg)  Height: '5\' 7"'$  (1.702 m)   Body mass index is 51.2 kg/m.    Physical Exam Vitals reviewed.  Constitutional:      General: She is not in acute distress.    Appearance: Normal appearance. She is well-developed. She is not diaphoretic.  HENT:     Head: Normocephalic and atraumatic.  Eyes:     General: No scleral icterus.    Conjunctiva/sclera: Conjunctivae normal.  Neck:     Thyroid: No thyromegaly.  Cardiovascular:     Rate and Rhythm: Normal rate and regular rhythm.     Pulses: Normal pulses.     Heart sounds: Normal heart sounds. No murmur heard. Pulmonary:     Effort: Pulmonary effort is normal. No respiratory distress.     Breath sounds: Normal breath sounds. No wheezing, rhonchi or rales.  Musculoskeletal:     Cervical back: Neck supple.     Right lower leg: No edema.     Left lower leg: No edema.  Lymphadenopathy:     Cervical: No cervical adenopathy.  Skin:    General: Skin is warm and dry.     Findings: No rash.  Neurological:     Mental Status: She is alert and oriented to person, place, and time. Mental status is at baseline.  Psychiatric:        Mood and Affect: Mood normal.        Behavior: Behavior normal.     No results found for any  visits on 10/03/20.  Assessment & Plan  ---------------------------------------------------------------------------------------------------------------------- Problem List Items Addressed This Visit       Endocrine   Hyperlipidemia associated with type 2 diabetes mellitus ([place])    Previously well controlled Continue statin Repeat FLP and CMP Goal LDL < 70      Relevant Medications   empagliflozin (JARDIANCE) 25 MG TABS tablet   Other Relevant Orders   Lipid panel   Comprehensive metabolic panel   Type 2 diabetes mellitus with hyperglycemia, without long-term current use of insulin ([place]) - Primary    Uncontrolled with hyperglycemia Continue metformin Increase jardiance ot '25mg'$  daily Encourage Prevnar at next visit Continue statin F/u in 70mand recheck A1c      Relevant Medications   empagliflozin (JARDIANCE) 25 MG TABS tablet   Other Relevant Orders   POCT HgB A1C (Completed)     Genitourinary   Atrophic vaginitis    New problem Trial of vaginal estrace cream        Other   BMI 50.0-59.9, adult ([place]    Discussed importance of healthy weight management Discussed diet and exercise  Consider healthy weight and wellness clinic      Relevant Medications   empagliflozin (JARDIANCE) 25 MG TABS tablet   Perimenopausal   Other Visit Diagnoses     Morbid obesity ([place]       Relevant Medications   empagliflozin (JARDIANCE) 25 MG TABS tablet   Other Relevant Orders   Lipid panel   Comprehensive metabolic panel   Dysuria       Relevant Medications   empagliflozin (JARDIANCE) 25 MG TABS tablet        Return in about 3 months (around 01/03/2021) for CPE, With new PCP.      I,Essence Turner,acting as a sEducation administratorfor ALavon Paganini MD.,have documented all relevant documentation on the behalf of ALavon Paganini MD,as directed by  ALavon Paganini MD while in the presence of ALavon Paganini MD.   I, ALavon Paganini MD, have reviewed all documentation  for this visit. The documentation on 10/03/20 for the exam, diagnosis, procedures, and orders are all accurate and complete.  Treyten Monestime, Dionne Bucy, MD, MPH [facility]

## 2020-10-04 LAB — COMPREHENSIVE METABOLIC PANEL
ALT: 19 IU/L (ref 0–32)
AST: 25 IU/L (ref 0–40)
Albumin/Globulin Ratio: 1.7 (ref 1.2–2.2)
Albumin: 4.3 g/dL (ref 3.8–4.8)
Alkaline Phosphatase: 80 IU/L (ref 44–121)
BUN/Creatinine Ratio: 15 (ref 9–23)
BUN: 10 mg/dL (ref 6–24)
Bilirubin Total: 0.5 mg/dL (ref 0.0–1.2)
CO2: 18 mmol/L — ABNORMAL LOW (ref 20–29)
Calcium: 9.2 mg/dL (ref 8.7–10.2)
Chloride: 103 mmol/L (ref 96–106)
Creatinine, Ser: 0.66 mg/dL (ref 0.57–1.00)
Globulin, Total: 2.5 g/dL (ref 1.5–4.5)
Glucose: 140 mg/dL — ABNORMAL HIGH (ref 65–99)
Potassium: 4.6 mmol/L (ref 3.5–5.2)
Sodium: 142 mmol/L (ref 134–144)
Total Protein: 6.8 g/dL (ref 6.0–8.5)
eGFR: 109 mL/min/{1.73_m2} (ref >59)

## 2020-10-04 LAB — LIPID PANEL
Chol/HDL Ratio: 5.1 ratio — ABNORMAL HIGH (ref 0.0–4.4)
Cholesterol, Total: 164 mg/dL (ref 100–199)
HDL: 32 mg/dL — ABNORMAL LOW (ref >39)
LDL Chol Calc (NIH): 91 mg/dL (ref 0–99)
Triglycerides: 242 mg/dL — ABNORMAL HIGH (ref 0–149)
VLDL Cholesterol Cal: 41 mg/dL — ABNORMAL HIGH (ref 5–40)

## 2020-10-30 ENCOUNTER — Other Ambulatory Visit: Payer: Self-pay | Admitting: Family Medicine

## 2020-10-30 DIAGNOSIS — E1169 Type 2 diabetes mellitus with other specified complication: Secondary | ICD-10-CM

## 2020-11-06 ENCOUNTER — Encounter: Payer: Self-pay | Admitting: Nurse Practitioner

## 2020-11-06 ENCOUNTER — Ambulatory Visit: Payer: Managed Care, Other (non HMO) | Admitting: Nurse Practitioner

## 2020-11-06 ENCOUNTER — Other Ambulatory Visit: Payer: Self-pay

## 2020-11-06 VITALS — BP 119/85 | HR 71 | Temp 98.6°F | Resp 18 | Wt 325.8 lb

## 2020-11-06 DIAGNOSIS — N3001 Acute cystitis with hematuria: Secondary | ICD-10-CM | POA: Diagnosis not present

## 2020-11-06 DIAGNOSIS — R5383 Other fatigue: Secondary | ICD-10-CM | POA: Diagnosis not present

## 2020-11-06 DIAGNOSIS — R300 Dysuria: Secondary | ICD-10-CM

## 2020-11-06 LAB — URINALYSIS, ROUTINE W REFLEX MICROSCOPIC
Bilirubin, UA: NEGATIVE
Ketones, UA: NEGATIVE
Leukocytes,UA: NEGATIVE
Nitrite, UA: NEGATIVE
Protein,UA: NEGATIVE
Specific Gravity, UA: 1.02 (ref 1.005–1.030)
Urobilinogen, Ur: 0.2 mg/dL (ref 0.2–1.0)
pH, UA: 5.5 (ref 5.0–7.5)

## 2020-11-06 LAB — MICROSCOPIC EXAMINATION: WBC, UA: NONE SEEN /hpf (ref 0–5)

## 2020-11-06 MED ORDER — FLUCONAZOLE 150 MG PO TABS: 150.0000 mg | ORAL_TABLET | Freq: Once | ORAL | 0 refills | Status: AC

## 2020-11-06 MED ORDER — NITROFURANTOIN MONOHYD MACRO 100 MG PO CAPS: 100.0000 mg | ORAL_CAPSULE | Freq: Two times a day (BID) | ORAL | 0 refills | Status: DC

## 2020-11-06 NOTE — Patient Instructions (Signed)
Start macrobid twice a day for 5 days for UTI Take diflucan at the end of the antibiotics Increase fluids Take vitamin D or B to help with fatigue At future visit with PCP, discuss sleep study

## 2020-11-06 NOTE — Progress Notes (Signed)
Acute Office Visit  Subjective:    Patient ID: Leslie Morris, female    DOB: Nov 06, 1970, 50 y.o.   MRN: [id number]  Chief Complaint  Patient presents with   Urinary Tract Infection    Dysuria for the last several days. She has a history of kidney stones, but this feels different.     HPI Patient is in today for dysuria and urinary frequency. She has a history of kidney stones, however this feels different than before.   URINARY SYMPTOMS  Dysuria: burning Urinary frequency: yes Urgency: yes Small volume voids: yes Symptom severity:  mild Urinary incontinence: no Foul odor: no Hematuria: no Abdominal pain: yes Back pain: no Suprapubic pain/pressure: no Flank pain: no Fever:  no Vomiting: no Relief with cranberry juice:  n/a Relief with pyridium:  n/a Status: stable Previous urinary tract infection: yes Recurrent urinary tract infection: no Treatments attempted: increasing fluids, ibuprofen    Past Medical History:  Diagnosis Date   Anxiety    Arthritis    left ankle, wrist, hand   BMI 50.0-59.9, adult ([facility])    Carpal tunnel syndrome on left    Family history of adverse reaction to anesthesia    Dad is slow to wake up and n/v   History of kidney stones    Left ankle tendonitis    Medullary sponge kidney    bilateral   PONV (postoperative nausea and vomiting)    severe   Right knee meniscal tear    Type 2 diabetes mellitus ([facility])    followed by pcp--- last A1c 6.8 on 09-25-2016   Urinary incontinence    due to Medullary sponge kidney   Vitamin D deficiency     Past Surgical History:  Procedure Laterality Date   ABDOMINAL HYSTERECTOMY  2003  approx.   ovaries remained    APPENDECTOMY     CARPAL TUNNEL RELEASE Left 10/24/2016   Procedure: CARPAL TUNNEL RELEASE;  Surgeon: Bradly Bienenstock, MD;  Location: [facility] OR;  Service: Orthopedics;  Laterality: Left;   CHONDROPLASTY Right 10/21/2018   Procedure: CHONDROPLASTY;  Surgeon: Donato Heinz, MD;  Location: [facility]  ORS;  Service: Orthopedics;  Laterality: Right;   COLONOSCOPY  2003 approx.   CYSTOSCOPY MACROPLASTIQUE IMPLANT  10-05-2012    [facility]   HERNIA REPAIR     umbilical   KNEE ARTHROSCOPY Right 10/21/2018   Procedure: ARTHROSCOPY KNEE, ;  Surgeon: Donato Heinz, MD;  Location: [facility] ORS;  Service: Orthopedics;  Laterality: Right;   LAPAROSCOPIC APPENDECTOMY  12/04/2010   [facility]   and Umbilical Hernia Repair   LAPAROSCOPIC CHOLECYSTECTOMY  10-10-2009   [facility]   MENISECTOMY Right 10/21/2018   Procedure: PARTIAL LATERAL MENISECTOMY;  Surgeon: Donato Heinz, MD;  Location: [facility] ORS;  Service: Orthopedics;  Laterality: Right;   ORIF LEFT ANKLE COMPLEX FRACTURES  age 65   same year hardware removed   TONSILLECTOMY  child   ULNAR COLLATERAL LIGAMENT REPAIR Left 03/21/2016   Procedure: Left thumb ulnar collateral ligament repair;  Surgeon: Bradly Bienenstock, MD;  Location: [facility] OR;  Service: Orthopedics;  Laterality: Left;   URETEROLITHOTOMY  2005    Family History  Problem Relation Age of Onset   Diabetes Father    Heart attack Father    Cancer Maternal Aunt        cervical cancer   Cancer Paternal Aunt        breast cancer   Heart disease Neg Hx    Hyperlipidemia Neg Hx  Hypertension Neg Hx    Stroke Neg Hx     Social History   Socioeconomic History   Marital status: Single    Spouse name: Not on file   Number of children: 2   Years of education: 14   Highest education level: Not on file  Occupational History   Occupation: [employer]  Tobacco Use   Smoking status: Every Day    Packs/day: 0.50    Years: 15.00    Pack years: 7.50    Types: Cigarettes   Smokeless tobacco: Never  Vaping Use   Vaping Use: Never used  Substance and Sexual Activity   Alcohol use: No    Alcohol/week: 0.0 standard drinks   Drug use: No   Sexual activity: Not on file  Other Topics Concern   Not on file  Social History Narrative   Not on file   Social Determinants of Health   Financial Resource  Strain: Not on file  Food Insecurity: Not on file  Transportation Needs: Not on file  Physical Activity: Not on file  Stress: Not on file  Social Connections: Not on file  Intimate Partner Violence: Not on file    Outpatient Medications Prior to Visit  Medication Sig Dispense Refill   albuterol (VENTOLIN HFA) 108 (90 Base) MCG/ACT inhaler TAKE 2 PUFFS BY MOUTH EVERY 6 HOURS AS NEEDED FOR WHEEZE OR SHORTNESS OF BREATH 8.5 each 3   aspirin EC 81 MG tablet Take 81 mg by mouth daily.     atorvastatin (LIPITOR) 10 MG tablet TAKE 1 TABLET BY MOUTH EVERY DAY 90 tablet 1   empagliflozin (JARDIANCE) 25 MG TABS tablet Take 1 tablet (25 mg total) by mouth daily. 90 tablet 1   ibuprofen (ADVIL) 600 MG tablet Take 1 tablet (600 mg total) by mouth every 6 (six) hours as needed. 30 tablet 0   metFORMIN (GLUCOPHAGE) 1000 MG tablet TAKE 1 TABLET (1,000 MG TOTAL) BY MOUTH 2 (TWO) TIMES DAILY WITH A MEAL. 180 tablet 1   Misc. Devices (PULSE OXIMETER) MISC To use to monitor oxygen levels daily 1 each 0   OneTouch Delica Lancets 33G MISC USE AS DIRECTED 3 TIMES A DAY 100 each 1   ONETOUCH VERIO test strip USE AS DIRECTED 100 strip 3   oxybutynin (DITROPAN) 5 MG tablet TAKE 1 TABLET BY MOUTH THREE TIMES A DAY 270 tablet 0   estradiol (ESTRACE VAGINAL) 0.1 MG/GM vaginal cream Place 1 Applicatorful vaginally 3 (three) times a week. (Patient not taking: Reported on 11/06/2020) 42.5 g 12   nystatin ointment (MYCOSTATIN) Apply 1 application topically 2 (two) times daily. (Patient not taking: Reported on 11/06/2020) 30 g 0   No facility-administered medications prior to visit.    Allergies  Allergen Reactions   Levaquin [Levofloxacin In D5w] Anaphylaxis and Rash    Review of Systems  Constitutional:  Positive for fatigue. Negative for fever.  HENT: Negative.    Respiratory: Negative.    Cardiovascular: Negative.   Gastrointestinal:  Positive for abdominal pain (RLQ). Negative for constipation, diarrhea,  nausea and vomiting.  Genitourinary:  Positive for dysuria, frequency and urgency. Negative for hematuria.  Musculoskeletal:  Positive for arthralgias (chronic knee pain).  Skin: Negative.   Neurological: Negative.       Objective:    Physical Exam Vitals and nursing note reviewed.  Constitutional:      General: She is not in acute distress.    Appearance: Normal appearance. She is obese.  HENT:  Head: Normocephalic.  Eyes:     Conjunctiva/sclera: Conjunctivae normal.  Cardiovascular:     Rate and Rhythm: Normal rate and regular rhythm.     Pulses: Normal pulses.     Heart sounds: Normal heart sounds.  Pulmonary:     Effort: Pulmonary effort is normal.     Breath sounds: Normal breath sounds.  Abdominal:     Palpations: Abdomen is soft.     Tenderness: There is abdominal tenderness (slight tenderness to right and left lower quadrant). There is no right CVA tenderness or left CVA tenderness.  Musculoskeletal:     Cervical back: Normal range of motion.  Skin:    General: Skin is warm.  Neurological:     General: No focal deficit present.     Mental Status: She is alert and oriented to person, place, and time.  Psychiatric:        Mood and Affect: Mood normal.        Behavior: Behavior normal.        Thought Content: Thought content normal.        Judgment: Judgment normal.    BP 119/85 (BP Location: Left Arm, Patient Position: Sitting)   Pulse 71   Temp 98.6 F (37 C) (Oral)   Resp 18   Wt (!) 325 lb 12.8 oz (147.8 kg)   LMP 11/30/1998 (Approximate) Comment: Hysterectomy at age 76 yo  SpO2 97%   BMI 51.03 kg/m  Wt Readings from Last 3 Encounters:  11/06/20 (!) 325 lb 12.8 oz (147.8 kg)  10/03/20 (!) 326 lb 14.4 oz (148.3 kg)  05/26/20 (!) 326 lb (147.9 kg)    Health Maintenance Due  Topic Date Due   COVID-19 Vaccine (1) Never done   FOOT EXAM  Never done   OPHTHALMOLOGY EXAM  Never done   COLONOSCOPY (Pts 45-71yrs Insurance coverage will need to be  confirmed)  Never done   INFLUENZA VACCINE  Never done   URINE MICROALBUMIN  11/21/2020    There are no preventive care reminders to display for this patient.   Lab Results  Component Value Date   TSH 3.570 11/22/2019   Lab Results  Component Value Date   WBC 11.4 (H) 11/22/2019   HGB 14.3 11/22/2019   HCT 43.9 11/22/2019   MCV 83 11/22/2019   PLT 302 11/22/2019   Lab Results  Component Value Date   NA 142 10/03/2020   K 4.6 10/03/2020   CO2 18 (L) 10/03/2020   GLUCOSE 140 (H) 10/03/2020   BUN 10 10/03/2020   CREATININE 0.66 10/03/2020   BILITOT 0.5 10/03/2020   ALKPHOS 80 10/03/2020   AST 25 10/03/2020   ALT 19 10/03/2020   PROT 6.8 10/03/2020   ALBUMIN 4.3 10/03/2020   CALCIUM 9.2 10/03/2020   ANIONGAP 13 02/09/2018   EGFR 109 10/03/2020   Lab Results  Component Value Date   CHOL 164 10/03/2020   Lab Results  Component Value Date   HDL 32 (L) 10/03/2020   Lab Results  Component Value Date   LDLCALC 91 10/03/2020   Lab Results  Component Value Date   TRIG 242 (H) 10/03/2020   Lab Results  Component Value Date   CHOLHDL 5.1 (H) 10/03/2020   Lab Results  Component Value Date   HGBA1C 7.7 (A) 10/03/2020       Assessment & Plan:   Problem List Items Addressed This Visit   None Visit Diagnoses     Acute cystitis with hematuria    -  Primary   No red flags on exam. Will treat with macrobid and diflucan to prevent yeast infection. Increase fluids. Call for worsening symptoms.    Relevant Orders   Urine Culture   Dysuria       U/A positive for bacteria and trace blood. Will treat for UTI and add on urine culture.   Relevant Orders   Urinalysis, Routine w reflex microscopic   Fatigue, unspecified type       Reviewed recent labs. Discussed good control of diabetes. She was low in the past for vitamin D, can start OTC supplement. F/U with PCP to discuss sleep study        Meds ordered this encounter  Medications   nitrofurantoin,  macrocrystal-monohydrate, (MACROBID) 100 MG capsule    Sig: Take 1 capsule (100 mg total) by mouth 2 (two) times daily.    Dispense:  10 capsule    Refill:  0   fluconazole (DIFLUCAN) 150 MG tablet    Sig: Take 1 tablet (150 mg total) by mouth once for 1 dose.    Dispense:  1 tablet    Refill:  0   A total of 30 minutes were spent on this encounter today. When total time is documented, this includes both the face-to-face and non-face-to-face time personally spent before, during and after the visit on the date of the encounter.   Charyl Dancer, NP

## 2020-11-11 LAB — URINE CULTURE

## 2020-11-14 ENCOUNTER — Other Ambulatory Visit: Payer: Self-pay | Admitting: Family Medicine

## 2020-11-14 ENCOUNTER — Telehealth: Payer: Managed Care, Other (non HMO) | Admitting: Physician Assistant

## 2020-11-14 ENCOUNTER — Ambulatory Visit: Payer: Self-pay | Admitting: *Deleted

## 2020-11-14 DIAGNOSIS — T7840XA Allergy, unspecified, initial encounter: Secondary | ICD-10-CM

## 2020-11-14 DIAGNOSIS — L270 Generalized skin eruption due to drugs and medicaments taken internally: Secondary | ICD-10-CM

## 2020-11-14 MED ORDER — PREDNISONE 20 MG PO TABS: 40.0000 mg | ORAL_TABLET | Freq: Every day | ORAL | 0 refills | Status: DC

## 2020-11-14 MED ORDER — CLOBETASOL PROPIONATE 0.05 % EX OINT: 1.0000 "application " | TOPICAL_OINTMENT | Freq: Two times a day (BID) | CUTANEOUS | 0 refills | Status: DC

## 2020-11-14 NOTE — Progress Notes (Signed)
Virtual Visit Consent   Leslie Morris, you are scheduled for a virtual visit with a [facility] provider today.     Just as with appointments in the office, your consent must be obtained to participate.  Your consent will be active for this visit and any virtual visit you may have with one of our providers in the next 365 days.     If you have a MyChart account, a copy of this consent can be sent to you electronically.  All virtual visits are billed to your insurance company just like a traditional visit in the office.    As this is a virtual visit, video technology does not allow for your provider to perform a traditional examination.  This may limit your provider's ability to fully assess your condition.  If your provider identifies any concerns that need to be evaluated in person or the need to arrange testing (such as labs, EKG, etc.), we will make arrangements to do so.     Although advances in technology are sophisticated, we cannot ensure that it will always work on either your end or our end.  If the connection with a video visit is poor, the visit may have to be switched to a telephone visit.  With either a video or telephone visit, we are not always able to ensure that we have a secure connection.     I need to obtain your verbal consent now.   Are you willing to proceed with your visit today?    ALLIYAH ROESLER has provided verbal consent on 11/14/2020 for a virtual visit (video or telephone).   Mar Daring, PA-C   Date: 11/14/2020 4:14 PM   Virtual Visit via Video Note   I, Mar Daring, connected with  Leslie Morris  ([id number], Jun 18, 1970) on 11/14/20 at  3:45 PM EDT by a video-enabled telemedicine application and verified that I am speaking with the correct person using two identifiers.  Location: Patient: Virtual Visit Location Patient: Home Provider: Virtual Visit Location Provider: Home Office   I discussed the limitations of evaluation and management by  telemedicine and the availability of in person appointments. The patient expressed understanding and agreed to proceed.    History of Present Illness: Leslie Morris is a 50 y.o. who identifies as a female who was assigned female at birth, and is being seen today for possible allergic reaction. Was given Macrobid for UTI. Has also had increased dose of Jardiance and had her flu vaccine on Friday, also. Started Friday with feeling lightheaded and off. Then Saturday she noticed her body was hurting and her scars were red and angry. Sunday symptoms continued then developed hives on her legs Sunday night. Then yesterday the rash progressed to cover her legs completely. Then started developing hives on her hands, and one on the left eye. Leg hives have improved. Denies SOB or difficulty breathing, facial swelling, mouth swelling   Problems:  Patient Active Problem List   Diagnosis Date Noted   Type 2 diabetes mellitus with hyperglycemia, without long-term current use of insulin ([place]) 10/03/2020   Atrophic vaginitis 10/03/2020   Perimenopausal 10/03/2020   Hyperlipidemia associated with type 2 diabetes mellitus ([place]) 04/05/2019   Adjustment disorder with mixed anxiety and depressed mood 12/17/2018   BMI 50.0-59.9, adult ([place]) 01/04/2018   Stress incontinence 12/04/2017   History of recurrent UTI (urinary tract infection) 12/04/2017   Carpal tunnel syndrome of left wrist 03/07/2017   Right knee pain  06/30/2015    Allergies:  Allergies  Allergen Reactions   Levaquin [Levofloxacin In D5w] Anaphylaxis and Rash   Macrobid [Nitrofurantoin] Rash   Medications:  Current Outpatient Medications:    predniSONE (DELTASONE) 20 MG tablet, Take 2 tablets (40 mg total) by mouth daily with breakfast., Disp: 10 tablet, Rfl: 0   albuterol (VENTOLIN HFA) 108 (90 Base) MCG/ACT inhaler, TAKE 2 PUFFS BY MOUTH EVERY 6 HOURS AS NEEDED FOR WHEEZE OR SHORTNESS OF BREATH, Disp: 8.5 each, Rfl: 3   aspirin EC 81 MG tablet,  Take 81 mg by mouth daily., Disp: , Rfl:    atorvastatin (LIPITOR) 10 MG tablet, TAKE 1 TABLET BY MOUTH EVERY DAY, Disp: 90 tablet, Rfl: 1   clobetasol ointment (TEMOVATE) 6.01 %, Apply 1 application topically 2 (two) times daily. DO NOT USE ON FACE, GENITALS, OR IN SKIN FOLDS. [place] HANDS FOLLOWING USE., Disp: 60 g, Rfl: 0   empagliflozin (JARDIANCE) 25 MG TABS tablet, Take 1 tablet (25 mg total) by mouth daily., Disp: 90 tablet, Rfl: 1   estradiol (ESTRACE VAGINAL) 0.1 MG/GM vaginal cream, Place 1 Applicatorful vaginally 3 (three) times a week. (Patient not taking: Reported on 11/06/2020), Disp: 42.5 g, Rfl: 12   ibuprofen (ADVIL) 600 MG tablet, Take 1 tablet (600 mg total) by mouth every 6 (six) hours as needed., Disp: 30 tablet, Rfl: 0   metFORMIN (GLUCOPHAGE) 1000 MG tablet, TAKE 1 TABLET (1,000 MG TOTAL) BY MOUTH 2 (TWO) TIMES DAILY WITH A MEAL., Disp: 180 tablet, Rfl: 1   Misc. Devices (PULSE OXIMETER) MISC, To use to monitor oxygen levels daily, Disp: 1 each, Rfl: 0   nitrofurantoin, macrocrystal-monohydrate, (MACROBID) 100 MG capsule, Take 1 capsule (100 mg total) by mouth 2 (two) times daily., Disp: 10 capsule, Rfl: 0   nystatin ointment (MYCOSTATIN), Apply 1 application topically 2 (two) times daily. (Patient not taking: Reported on 11/06/2020), Disp: 30 g, Rfl: 0   OneTouch Delica Lancets 09N MISC, USE AS DIRECTED 3 TIMES A DAY, Disp: 100 each, Rfl: 1   ONETOUCH VERIO test strip, USE AS DIRECTED, Disp: 100 strip, Rfl: 3   oxybutynin (DITROPAN) 5 MG tablet, TAKE 1 TABLET BY MOUTH THREE TIMES A DAY, Disp: 270 tablet, Rfl: 0  Observations/Objective: Patient is well-developed, well-nourished in no acute distress.  Resting comfortably at home.  Head is normocephalic, atraumatic.  No labored breathing.  Speech is clear and coherent with logical content.  Patient is alert and oriented at baseline.    Assessment and Plan: 1. Allergic reaction to drug, initial encounter - predniSONE  (DELTASONE) 20 MG tablet; Take 2 tablets (40 mg total) by mouth daily with breakfast.  Dispense: 10 tablet; Refill: 0  - Suspect Macrobid as source of allergy, however, flu vaccine could be culprit, even though patient has always tolerated - She has completed Macrobid treatment - Continue benadryl as needed - Prednisone added; Monitor sugars closely - Seek in person evaluation if symptoms progress or fail to improve  Follow Up Instructions: I discussed the assessment and treatment plan with the patient. The patient was provided an opportunity to ask questions and all were answered. The patient agreed with the plan and demonstrated an understanding of the instructions.  A copy of instructions were sent to the patient via MyChart unless otherwise noted below.    The patient was advised to call back or seek an in-person evaluation if the symptoms worsen or if the condition fails to improve as anticipated.  Time:  I spent 15  minutes with the patient via telehealth technology discussing the above problems/concerns.    Mar Daring, PA-C

## 2020-11-14 NOTE — Telephone Encounter (Signed)
Please advise 

## 2020-11-14 NOTE — Telephone Encounter (Signed)
Patient started Macrobid early last week for UTI-was seen at [facility]. After day 2 taking Macrobid she noticed red splotches and still today new areas popping up. Somewhat itchy,completed antibiotic 4 days ago. Denies SOB/CP/Fever and all UTI symptoms have resolved. Advised antihistamine, hydrocortisone and Aveeno oat bath. She would like to be seen with the rash-advised UC due to no availability. She is requesting to be called for possible appointment with [facility] if available.     Answer Assessment - Initial Assessment Questions 1. MAIN SYMPTOM: "What is your main symptom?" "How bad is it?"       Rash and red splotches all over 2. RESPIRATORY STATUS: "Are you having difficulty breathing?"  (e.g., yes/no, wheezing, unable to complete a sentence)      no 3. SWALLOWING: "Can you swallow?" (e.g., yes/no, food, fluid, saliva)      yes 4. VASCULAR STATUS: "Are you feeling weak?" If Yes, ask: "Can you stand and walk normally?"     Lightheaded at times on/off 5. ONSET: "When did the reaction start?" (Minutes or hours ago)      Tuesday or Wednesday last week 6. SUBSTANCE: "What are you reacting to?" "When did the contact occur?"      Possibly to Macrobid 7. PREVIOUS REACTION: "Have you ever reacted to it before?" If Yes, ask: "What happened that time?"     No but had the same rash with Levoquin 8. EPINEPHRINE: "Do you have an epinephrine autoinjector (e.g., EpiPen)?"     Yes  Protocols used: Anaphylaxis-A-AH

## 2020-11-14 NOTE — Telephone Encounter (Signed)
Returned call to patient. Patient's symptoms have worsened. Advised Telemed or UC. Patient scheduled telemed visit.

## 2020-11-14 NOTE — Patient Instructions (Signed)
Leslie Morris, thank you for joining Mar Daring, PA-C for today's virtual visit.  While this provider is not your primary care provider (PCP), if your PCP is located in our provider database this encounter information will be shared with them immediately following your visit.  Consent: (Patient) Leslie Morris provided verbal consent for this virtual visit at the beginning of the encounter.  Current Medications:  Current Outpatient Medications:    predniSONE (DELTASONE) 20 MG tablet, Take 2 tablets (40 mg total) by mouth daily with breakfast., Disp: 10 tablet, Rfl: 0   albuterol (VENTOLIN HFA) 108 (90 Base) MCG/ACT inhaler, TAKE 2 PUFFS BY MOUTH EVERY 6 HOURS AS NEEDED FOR WHEEZE OR SHORTNESS OF BREATH, Disp: 8.5 each, Rfl: 3   aspirin EC 81 MG tablet, Take 81 mg by mouth daily., Disp: , Rfl:    atorvastatin (LIPITOR) 10 MG tablet, TAKE 1 TABLET BY MOUTH EVERY DAY, Disp: 90 tablet, Rfl: 1   clobetasol ointment (TEMOVATE) 6.26 %, Apply 1 application topically 2 (two) times daily. DO NOT USE ON FACE, GENITALS, OR IN SKIN FOLDS. [place] HANDS FOLLOWING USE., Disp: 60 g, Rfl: 0   empagliflozin (JARDIANCE) 25 MG TABS tablet, Take 1 tablet (25 mg total) by mouth daily., Disp: 90 tablet, Rfl: 1   estradiol (ESTRACE VAGINAL) 0.1 MG/GM vaginal cream, Place 1 Applicatorful vaginally 3 (three) times a week. (Patient not taking: Reported on 11/06/2020), Disp: 42.5 g, Rfl: 12   ibuprofen (ADVIL) 600 MG tablet, Take 1 tablet (600 mg total) by mouth every 6 (six) hours as needed., Disp: 30 tablet, Rfl: 0   metFORMIN (GLUCOPHAGE) 1000 MG tablet, TAKE 1 TABLET (1,000 MG TOTAL) BY MOUTH 2 (TWO) TIMES DAILY WITH A MEAL., Disp: 180 tablet, Rfl: 1   Misc. Devices (PULSE OXIMETER) MISC, To use to monitor oxygen levels daily, Disp: 1 each, Rfl: 0   nitrofurantoin, macrocrystal-monohydrate, (MACROBID) 100 MG capsule, Take 1 capsule (100 mg total) by mouth 2 (two) times daily., Disp: 10 capsule, Rfl: 0   nystatin  ointment (MYCOSTATIN), Apply 1 application topically 2 (two) times daily. (Patient not taking: Reported on 11/06/2020), Disp: 30 g, Rfl: 0   OneTouch Delica Lancets 94W MISC, USE AS DIRECTED 3 TIMES A DAY, Disp: 100 each, Rfl: 1   ONETOUCH VERIO test strip, USE AS DIRECTED, Disp: 100 strip, Rfl: 3   oxybutynin (DITROPAN) 5 MG tablet, TAKE 1 TABLET BY MOUTH THREE TIMES A DAY, Disp: 270 tablet, Rfl: 0   Medications ordered in this encounter:  Meds ordered this encounter  Medications   predniSONE (DELTASONE) 20 MG tablet    Sig: Take 2 tablets (40 mg total) by mouth daily with breakfast.    Dispense:  10 tablet    Refill:  0    Order Specific Question:   Supervising Provider    Answer:   Noemi Chapel [[id number]]     *If you need refills on other medications prior to your next appointment, please contact your pharmacy*  Follow-Up: Call back or seek an in-person evaluation if the symptoms worsen or if the condition fails to improve as anticipated.  Other Instructions Drug Rash A drug rash occurs when a medicine causes a change in the color or texture of the skin. It can develop minutes, hours, or days after you take the medicine. The rash may appear on a small area of skin or all over your body. What are the causes? This condition is usually caused by your body's reaction (allergy) to  a medicine. It can also be caused by exposure to sunlight after taking a medicine that makes your skin sensitive to light. Though any medicine can cause a rash or reaction, medicines that are more likely to cause rashes include: Penicillin. Antibiotic medicines. Medicines that treat seizures. Medicines that treat cancer (chemotherapy). Aspirin and other NSAIDs. Injectable dyes that contain iodine. Insulin. What are the signs or symptoms? Symptoms of this condition include: Redness. Tiny bumps. Peeling. Itching. Itchy welts (hives). Swelling. How is this diagnosed? This condition may be diagnosed based  on: A physical exam. Tests to find out which medicine caused the rash. These tests may include: Skin tests. Blood tests. Challenge test. For this test, you stop taking all the medicines that you do not need to take. Then, you start taking them again by adding back one medicine at a time. How is this treated? This condition is treated with medicines, including: Antihistamine. This may be given to relieve itching. NSAIDs. These may be given to reduce swelling and to treat pain. A steroid medicine. This may be given to reduce swelling. The rash usually goes away when you stop taking the medicine that caused it. Follow these instructions at home: Take over-the-counter and prescription medicines only as told by your health care provider. Tell all your health care providers about any medicine reactions that you have had in the past. If your rash was caused by sensitivity to sunlight, and while your rash is healing: Avoid being in the sun if possible, especially when it is strongest, usually between 10 a.m. and 4 p.m. Cover your skin with pants, long sleeves, and a hat when you are exposed to sunlight. If you have hives: Take a cool shower or use a cool compress to relieve itchiness. Take over-the-counter antihistamines, as recommended by your health care provider, until the hives are gone. Hives are not contagious. Keep all follow-up visits as told by your health care provider. This is important. Contact a health care provider if you have: A fever. A rash that is not going away. A rash that gets worse. A rash that comes back. Wheezing or coughing. Get help right away if: You start to have breathing problems. You start to have shortness of breath. Your face or throat starts to swell. You have severe weakness with dizziness or fainting. You have chest pain. These symptoms may represent a serious problem that is an emergency. Do not wait to see if the symptoms will go away. Get medical help  right away. Call your local emergency services (911 in the [place] Do not drive yourself to the hospital. Summary A drug rash occurs when a medicine causes a change in the color or texture of the skin. The rash may appear on a small area of skin or all over your body. It can develop minutes, hours, or days after you take the medicine. Your health care provider will do various tests to determine what medicine caused your rash. The rash may be treated with medicine to relieve itching, swelling, and pain. This information is not intended to replace advice given to you by your health care provider. Make sure you discuss any questions you have with your health care provider. Document Revised: 04/07/2020 Document Reviewed: 05/12/2019 Elsevier Patient Education  2022 [employer].    If you have been instructed to have an in-person evaluation today at a local Urgent Care facility, please use the link below. It will take you to a list of all of our available [facility], including address, phone number and hours of operation. Please do not delay care.  [facility]  If you or a family member do not have a primary care provider, use the link below to schedule a visit and establish care. When you choose a [facility] primary care physician or advanced practice provider, you gain a long-term partner in health. Find a Primary Care Provider  Learn more about [facility] in-office and virtual care options: [facility]

## 2020-11-22 ENCOUNTER — Encounter: Payer: Managed Care, Other (non HMO) | Admitting: Family Medicine

## 2020-11-27 ENCOUNTER — Telehealth: Payer: Self-pay

## 2020-11-27 NOTE — Telephone Encounter (Signed)
Spoke with patient on the phone who stated that she wasnted to get her appt at [facility] because it is closet to her home but stated that she needed an appt for CPE before the end of October. Patient states that when she was last in ofice biometric form was filled out. KW

## 2020-11-27 NOTE — Telephone Encounter (Signed)
Copied from [place] [id number]. Topic: Appointment Scheduling - Scheduling Inquiry for Clinic >> Nov 27, 2020 10:14 AM Greggory Keen D wrote: Reason for CRM: Pt is confused about her appts, stainging she had a biometrics, bu needs a CPe. She has two appts one with [facility] and one as a NP with [facility].  She is not sure what is going on.  CB#  [phone]

## 2020-11-27 NOTE — Telephone Encounter (Signed)
Left detailed voice message.,

## 2020-11-30 ENCOUNTER — Encounter: Payer: Self-pay | Admitting: Physician Assistant

## 2020-11-30 ENCOUNTER — Other Ambulatory Visit: Payer: Self-pay

## 2020-11-30 ENCOUNTER — Ambulatory Visit (INDEPENDENT_AMBULATORY_CARE_PROVIDER_SITE_OTHER): Payer: Managed Care, Other (non HMO) | Admitting: Physician Assistant

## 2020-11-30 VITALS — BP 111/72 | HR 88 | Temp 98.5°F | Ht 67.0 in | Wt 321.0 lb

## 2020-11-30 DIAGNOSIS — Z1231 Encounter for screening mammogram for malignant neoplasm of breast: Secondary | ICD-10-CM | POA: Diagnosis not present

## 2020-11-30 DIAGNOSIS — B379 Candidiasis, unspecified: Secondary | ICD-10-CM | POA: Diagnosis not present

## 2020-11-30 DIAGNOSIS — E1165 Type 2 diabetes mellitus with hyperglycemia: Secondary | ICD-10-CM

## 2020-11-30 DIAGNOSIS — R809 Proteinuria, unspecified: Secondary | ICD-10-CM

## 2020-11-30 DIAGNOSIS — Z0000 Encounter for general adult medical examination without abnormal findings: Secondary | ICD-10-CM

## 2020-11-30 DIAGNOSIS — M25572 Pain in left ankle and joints of left foot: Secondary | ICD-10-CM

## 2020-11-30 DIAGNOSIS — G8929 Other chronic pain: Secondary | ICD-10-CM

## 2020-11-30 DIAGNOSIS — E1169 Type 2 diabetes mellitus with other specified complication: Secondary | ICD-10-CM

## 2020-11-30 DIAGNOSIS — T3695XA Adverse effect of unspecified systemic antibiotic, initial encounter: Secondary | ICD-10-CM

## 2020-11-30 DIAGNOSIS — Z1211 Encounter for screening for malignant neoplasm of colon: Secondary | ICD-10-CM

## 2020-11-30 DIAGNOSIS — E785 Hyperlipidemia, unspecified: Secondary | ICD-10-CM

## 2020-11-30 LAB — POCT UA - MICROALBUMIN: Microalbumin Ur, POC: 50 mg/L

## 2020-11-30 LAB — POCT GLYCOSYLATED HEMOGLOBIN (HGB A1C): Hemoglobin A1C: 8 % — AB (ref 4.0–5.6)

## 2020-11-30 MED ORDER — TIRZEPATIDE 2.5 MG/0.5ML ~~LOC~~ SOAJ: SUBCUTANEOUS | 0 refills | Status: DC

## 2020-11-30 MED ORDER — CELECOXIB 100 MG PO CAPS: 100.0000 mg | ORAL_CAPSULE | Freq: Two times a day (BID) | ORAL | 0 refills | Status: DC

## 2020-11-30 MED ORDER — LISINOPRIL 5 MG PO TABS: 5.0000 mg | ORAL_TABLET | Freq: Every day | ORAL | 3 refills | Status: DC

## 2020-11-30 MED ORDER — FLUCONAZOLE 150 MG PO TABS: 150.0000 mg | ORAL_TABLET | Freq: Once | ORAL | 0 refills | Status: AC

## 2020-11-30 NOTE — Progress Notes (Signed)
Complete physical exam   Patient: Leslie Morris   DOB: [birth year]   50 y.o. Female  MRN: [id number] Visit Date: 11/30/2020  Today's healthcare provider: [name], PA-C   Chief Complaint  Patient presents with   Annual Exam    Subjective    Leslie Morris is a 50 y.o. female who presents today for a complete physical exam.  She reports consuming a general diet. Exercise is limited by orthopedic condition(s): left ankle injury. She generally feels fairly well. She reports sleeping fairly well. She does have additional problems to discuss today.   Kermit Balo Sharyn Lull) reports muscle cramps in left ankle and calf secondary to her history of a L ankle reconstruction surgery in her teens. She has had multiple cortisone injections in the past. Reports tylenol/advil does not help her sleep at night, this is when the pain is worse.  She also wanted to discuss mounjaro for weight loss. She tried Phentermine in the past with success, but as she has DM II wondered if she could tried [place].   She reports being treated for a UTI a few days ago, which gave her a yeast infection. She took one dose of diflucan, but feels her yeast symptoms are persistent.   Past Medical History:  Diagnosis Date   Anxiety    Arthritis    left ankle, wrist, hand   BMI 50.0-59.9, adult (HCC)    Carpal tunnel syndrome on left    Family history of adverse reaction to anesthesia    Dad is slow to wake up and n/v   History of kidney stones    Left ankle tendonitis    Medullary sponge kidney    bilateral   PONV (postoperative nausea and vomiting)    severe   Right knee meniscal tear    Type 2 diabetes mellitus ([place])    followed by pcp--- last A1c 6.8 on 09-25-2016   Urinary incontinence    due to Medullary sponge kidney   Vitamin D deficiency    Past Surgical History:  Procedure Laterality Date   ABDOMINAL HYSTERECTOMY  2003  approx.   ovaries remained    APPENDECTOMY     CARPAL TUNNEL RELEASE Left  10/24/2016   Procedure: CARPAL TUNNEL RELEASE;  Surgeon: Iran Planas, MD;  Location: [facility];  Service: Orthopedics;  Laterality: Left;   CHONDROPLASTY Right 10/21/2018   Procedure: CHONDROPLASTY;  Surgeon: Dereck Leep, MD;  Location: [facility];  Service: Orthopedics;  Laterality: Right;   COLONOSCOPY  2003 approx.   CYSTOSCOPY MACROPLASTIQUE IMPLANT  10-05-2012    [facility]   HERNIA REPAIR     umbilical   KNEE ARTHROSCOPY Right 10/21/2018   Procedure: ARTHROSCOPY KNEE, ;  Surgeon: Dereck Leep, MD;  Location: [facility];  Service: Orthopedics;  Laterality: Right;   LAPAROSCOPIC APPENDECTOMY  12/04/2010   [facility]   and Umbilical Hernia Repair   LAPAROSCOPIC CHOLECYSTECTOMY  10-10-2009   [facility]   MENISECTOMY Right 10/21/2018   Procedure: PARTIAL LATERAL MENISECTOMY;  Surgeon: Dereck Leep, MD;  Location: [facility];  Service: Orthopedics;  Laterality: Right;   ORIF LEFT ANKLE COMPLEX FRACTURES  age 28   same year hardware removed   TONSILLECTOMY  child   ULNAR COLLATERAL LIGAMENT REPAIR Left 03/21/2016   Procedure: Left thumb ulnar collateral ligament repair;  Surgeon: Iran Planas, MD;  Location: [facility];  Service: Orthopedics;  Laterality: Left;   URETEROLITHOTOMY  2005   Social History   Socioeconomic History  Marital status: Single    Spouse name: Not on file   Number of children: 2   Years of education: 14   Highest education level: Not on file  Occupational History   Occupation: [employer]  Tobacco Use   Smoking status: Every Day    Packs/day: 0.50    Years: 15.00    Pack years: 7.50    Types: Cigarettes   Smokeless tobacco: Never  Vaping Use   Vaping Use: Never used  Substance and Sexual Activity   Alcohol use: No    Alcohol/week: 0.0 standard drinks   Drug use: No   Sexual activity: Not on file  Other Topics Concern   Not on file  Social History Narrative   Not on file   Social Determinants of Health   Financial Resource Strain: Not on file  Food Insecurity: Not  on file  Transportation Needs: Not on file  Physical Activity: Not on file  Stress: Not on file  Social Connections: Not on file  Intimate Partner Violence: Not on file   Family Status  Relation Name Status   Mother  Alive   Father  Alive   Mat Aunt  (Not Specified)   Field seismologist  (Not Specified)   Neg Hx  (Not Specified)   Family History  Problem Relation Age of Onset   Endometriosis Mother    Diabetes Father    Heart attack Father    Cancer Maternal Aunt        cervical cancer   Cancer Paternal Aunt        breast cancer   Heart disease Neg Hx    Hyperlipidemia Neg Hx    Hypertension Neg Hx    Stroke Neg Hx    Allergies  Allergen Reactions   Levaquin [Levofloxacin In D5w] Anaphylaxis and Rash   Macrobid [Nitrofurantoin] Rash    Patient Care Team: [name], PA-C as PCP - General (Physician Assistant)   Medications: Outpatient Medications Prior to Visit  Medication Sig   albuterol (VENTOLIN HFA) 108 (90 Base) MCG/ACT inhaler TAKE 2 PUFFS BY MOUTH EVERY 6 HOURS AS NEEDED FOR WHEEZE OR SHORTNESS OF BREATH   aspirin EC 81 MG tablet Take 81 mg by mouth daily.   atorvastatin (LIPITOR) 10 MG tablet TAKE 1 TABLET BY MOUTH EVERY DAY   clobetasol ointment (TEMOVATE) 7.67 % Apply 1 application topically 2 (two) times daily. DO NOT USE ON FACE, GENITALS, OR IN SKIN FOLDS. [place] HANDS FOLLOWING USE.   empagliflozin (JARDIANCE) 25 MG TABS tablet Take 1 tablet (25 mg total) by mouth daily.   ibuprofen (ADVIL) 600 MG tablet Take 1 tablet (600 mg total) by mouth every 6 (six) hours as needed.   metFORMIN (GLUCOPHAGE) 1000 MG tablet TAKE 1 TABLET (1,000 MG TOTAL) BY MOUTH 2 (TWO) TIMES DAILY WITH A MEAL.   OneTouch Delica Lancets 20N MISC USE AS DIRECTED 3 TIMES A DAY   ONETOUCH VERIO test strip USE AS DIRECTED   oxybutynin (DITROPAN) 5 MG tablet TAKE 1 TABLET BY MOUTH THREE TIMES A DAY   Misc. Devices (PULSE OXIMETER) MISC To use to monitor oxygen levels daily   [DISCONTINUED]  estradiol (ESTRACE VAGINAL) 0.1 MG/GM vaginal cream Place 1 Applicatorful vaginally 3 (three) times a week.   [DISCONTINUED] nitrofurantoin, macrocrystal-monohydrate, (MACROBID) 100 MG capsule Take 1 capsule (100 mg total) by mouth 2 (two) times daily.   [DISCONTINUED] nystatin ointment (MYCOSTATIN) Apply 1 application topically 2 (two) times daily.   [DISCONTINUED] predniSONE (DELTASONE)  20 MG tablet Take 2 tablets (40 mg total) by mouth daily with breakfast.   No facility-administered medications prior to visit.    Review of Systems  Constitutional:  Positive for fatigue. Negative for activity change, appetite change, chills, diaphoresis, fever and unexpected weight change.  HENT:  Positive for hearing loss and tinnitus. Negative for congestion, dental problem, drooling, ear discharge, ear pain, facial swelling, mouth sores, nosebleeds, postnasal drip, rhinorrhea, sinus pressure, sinus pain, sneezing, sore throat, trouble swallowing and voice change.   Eyes:  Positive for photophobia. Negative for pain, discharge, redness, itching and visual disturbance.  Respiratory: Negative.    Cardiovascular: Negative.   Gastrointestinal: Negative.   Endocrine: Positive for polydipsia and polyuria. Negative for cold intolerance, heat intolerance and polyphagia.  Genitourinary:  Positive for enuresis and frequency. Negative for decreased urine volume, difficulty urinating, dyspareunia, dysuria, flank pain, genital sores, hematuria, menstrual problem, pelvic pain, urgency, vaginal bleeding, vaginal discharge and vaginal pain.  Musculoskeletal:  Positive for arthralgias, back pain and joint swelling. Negative for gait problem, myalgias, neck pain and neck stiffness.  Skin: Negative.   Allergic/Immunologic: Negative.   Neurological: Negative.   Hematological: Negative.   Psychiatric/Behavioral: Negative.       Objective    BP 111/72 (BP Location: Right Arm, Patient Position: Sitting, Cuff Size: Large)    Pulse 88   Temp 98.5 F (36.9 C) (Oral)   Ht 5\' 7"  (1.702 m)   Wt (!) 321 lb (145.6 kg)   LMP 11/30/1998 (Approximate) Comment: Hysterectomy at age 38 yo  SpO2 99%   BMI 50.28 kg/m  Hemoglobin A1C Latest Ref Rng & Units 11/30/2020 10/03/2020  HGBA1C 4.0 - 5.6 % 8.0(A) 7.7(A)  Some recent data might be hidden    Physical Exam Constitutional:      General: She is awake.     Appearance: She is well-developed.  HENT:     Head: Normocephalic.     Right Ear: Tympanic membrane normal.     Left Ear: Tympanic membrane normal.  Eyes:     Conjunctiva/sclera: Conjunctivae normal.     Pupils: Pupils are equal, round, and reactive to light.  Neck:     Thyroid: No thyroid mass or thyromegaly.  Cardiovascular:     Rate and Rhythm: Normal rate and regular rhythm.     Pulses:          Dorsalis pedis pulses are 3+ on the right side and 3+ on the left side.     Heart sounds: Normal heart sounds.  Pulmonary:     Effort: Pulmonary effort is normal.     Breath sounds: Normal breath sounds.  Abdominal:     Palpations: Abdomen is soft.     Tenderness: There is no abdominal tenderness.  Musculoskeletal:     Right lower leg: No swelling.     Left lower leg: No swelling.  Feet:     Right foot:     Protective Sensation: 3 sites tested.  3 sites sensed.     Skin integrity: Warmth present. No ulcer or skin breakdown.     Left foot:     Protective Sensation: 3 sites tested.  3 sites sensed.     Skin integrity: Warmth present. No ulcer or skin breakdown.     Comments: Minimal dependent edema bilaterally, no pitting edema. Lymphadenopathy:     Cervical: No cervical adenopathy.  Skin:    General: Skin is warm.  Neurological:     Mental Status: She is alert  and oriented to person, place, and time.  Psychiatric:        Attention and Perception: Attention normal.        Mood and Affect: Mood normal.        Speech: Speech normal.        Behavior: Behavior is cooperative.     Last depression  screening scores PHQ 2/9 Scores 11/30/2020 10/03/2020 05/10/2020  PHQ - 2 Score 1 2 2   PHQ- 9 Score 9 9 7    Last fall risk screening Fall Risk  11/30/2020  Falls in the past year? 0  Number falls in past yr: 0  Injury with Fall? 0  Risk for fall due to : No Fall Risks  Follow up Falls evaluation completed   Last Audit-C alcohol use screening Alcohol Use Disorder Test (AUDIT) 11/30/2020  1. How often do you have a drink containing alcohol? 0  2. How many drinks containing alcohol do you have on a typical day when you are drinking? 0  3. How often do you have six or more drinks on one occasion? 0  AUDIT-C Score 0  Alcohol Brief Interventions/Follow-up -   A score of 3 or more in women, and 4 or more in men indicates increased risk for alcohol abuse, EXCEPT if all of the points are from question 1   Results for orders placed or performed in visit on 11/30/20  POCT glycosylated hemoglobin (Hb A1C)  Result Value Ref Range   Hemoglobin A1C 8.0 (A) 4.0 - 5.6 %  POCT UA - Microalbumin  Result Value Ref Range   Microalbumin Ur, POC 50 mg/L    Assessment & Plan    Routine Health Maintenance and Physical Exam     Immunization History  Administered Date(s) Administered   Influenza-Unspecified 11/20/2020   PNEUMOCOCCAL CONJUGATE-20 11/30/2020   Tdap 02/12/2008    Health Maintenance  Topic Date Due   COVID-19 Vaccine (1) Never done   FOOT EXAM  Never done   OPHTHALMOLOGY EXAM  Never done   COLONOSCOPY (Pts 45-81yrs Insurance coverage will need to be confirmed)  Never done   HEMOGLOBIN A1C  05/31/2021   URINE MICROALBUMIN  11/30/2021   TETANUS/TDAP  12/02/2027   Pneumococcal Vaccine 7-65 Years old  Completed   INFLUENZA VACCINE  Completed   Hepatitis C Screening  Completed   HIV Screening  Completed   HPV VACCINES  Aged Out    Discussed health benefits of physical activity, and encouraged her to engage in regular exercise appropriate for her age and condition. She enjoys  water aerobics.  Problem List Items Addressed This Visit       Endocrine   Hyperlipidemia associated with type 2 diabetes mellitus ([place])    Fasting CMP, Lipids      Relevant Medications   lisinopril (ZESTRIL) 5 MG tablet   tirzepatide (MOUNJARO) 2.5 MG/0.5ML Pen   Other Relevant Orders   Lipid panel   Comprehensive metabolic panel   Type 2 diabetes mellitus with hyperglycemia, without long-term current use of insulin ([place])    I feel Mounjaro is appropriate for the pt, POC Hgb A1c today 8.0 No changes to [employer]-- start at 2.5 mg weekly for four weeks, then increase to 5 mg weekly. F/u 3 mo for DM II and weight management.        Relevant Medications   lisinopril (ZESTRIL) 5 MG tablet   tirzepatide (MOUNJARO) 2.5 MG/0.5ML Pen   Other Relevant Orders   CBC  Microalbumin, urine   POCT glycosylated hemoglobin (Hb A1C) (Completed)   Pneumococcal conjugate vaccine 20-valent (Prevnar 20) (Completed)   POCT UA - Microalbumin (Completed)     Other   Chronic pain of left ankle    Pt not open to ortho referral today. Discussed trying Celebrex, would like to avoid chronic Ibuprofen use to decrease incidence of kidney injury and GI complications. Advised to take only if she has significant pain and difficulty sleeping, eventually would like to refer to ortho or pain management. Can take QD or BID.      Relevant Medications   celecoxib (CELEBREX) 100 MG capsule   Antibiotic-induced yeast infection    Post abx use for UTI. Pt took one dose of Diflucan 3 days ago, feels symptoms are persistent. Take this dose today if possible.  Increase fluids.      Relevant Medications   fluconazole (DIFLUCAN) 150 MG tablet   Microalbuminuria    Start Lisinopril 5 mg PO daily.  If symptoms of dizziness, please check BP at home or call office.       Relevant Medications   lisinopril (ZESTRIL) 5 MG tablet   Other Relevant Orders   POCT glycosylated hemoglobin (Hb A1C)  (Completed)   Other Visit Diagnoses     Encounter for physical examination    -  Primary   Relevant Orders   Lipid panel   CBC   Comprehensive metabolic panel   Microalbumin, urine   Encounter for screening mammogram for breast cancer       Relevant Orders   MS DIGITAL SCREENING TOMO BILATERAL   Colon cancer screening       Relevant Orders   Ambulatory referral to Gastroenterology   Morbid obesity ([place])       Relevant Medications   tirzepatide (MOUNJARO) 2.5 MG/0.5ML Pen        Return in about 3 months (around 03/02/2021) for DMII, chronic pain.    I, [name], PA-C have reviewed all documentation for this visit. The documentation on 11/30/2020 for the exam, diagnosis, procedures, and orders are all accurate and complete.    [name], PA-C  [facility] [phone] (phone) [phone] (fax)  [place]

## 2020-11-30 NOTE — Assessment & Plan Note (Addendum)
I feel Leslie Morris is appropriate for the pt, POC Hgb A1c today 8.0 No changes to Jardiance She will continue checking BS at home in AM. If she notices fasting readings < 120 please call the office. Rx Monjaro-- start at 2.5 mg weekly for four weeks, then increase to 5 mg weekly. F/u 3 mo for DM II and weight management.

## 2020-11-30 NOTE — Assessment & Plan Note (Signed)
Pt not open to ortho referral today. Discussed trying Celebrex, would like to avoid chronic Ibuprofen use to decrease incidence of kidney injury and GI complications. Advised to take only if she has significant pain and difficulty sleeping, eventually would like to refer to ortho or pain management. Can take QD or BID.

## 2020-11-30 NOTE — Assessment & Plan Note (Signed)
Start Lisinopril 5 mg PO daily.  If symptoms of dizziness, please check BP at home or call office.

## 2020-11-30 NOTE — Patient Instructions (Addendum)
To Do: Mammogram screening Colonoscopy screening Start mounjaro  Start Lisinopril for kidney function protection  Eye exam  Consider COVID booster, check BS in AM and call office if < 120.   F/u 3 mo to discuss DMII, BP on lisinopril, and chronic ankle pain

## 2020-11-30 NOTE — Assessment & Plan Note (Addendum)
Post abx use for UTI. Take this dose today if possible, last dose 3 days ago Increase fluids.

## 2020-11-30 NOTE — Assessment & Plan Note (Signed)
Fasting CMP, Lipids

## 2020-12-04 ENCOUNTER — Other Ambulatory Visit: Payer: Self-pay | Admitting: Physician Assistant

## 2020-12-04 DIAGNOSIS — E1165 Type 2 diabetes mellitus with hyperglycemia: Secondary | ICD-10-CM

## 2020-12-04 MED ORDER — TIRZEPATIDE 5 MG/0.5ML ~~LOC~~ SOAJ: 5.0000 mg | SUBCUTANEOUS | 1 refills | Status: DC

## 2020-12-04 MED ORDER — TIRZEPATIDE 2.5 MG/0.5ML ~~LOC~~ SOAJ: 2.5000 mg | SUBCUTANEOUS | 0 refills | Status: AC

## 2020-12-04 NOTE — Progress Notes (Signed)
Reordered per pharmacy note needs two rx Pt will start 2.5 mg weekly x 4 weeks And then the 5 mg inj weekly

## 2020-12-10 ENCOUNTER — Other Ambulatory Visit: Payer: Self-pay | Admitting: Family Medicine

## 2020-12-10 DIAGNOSIS — E119 Type 2 diabetes mellitus without complications: Secondary | ICD-10-CM

## 2020-12-10 NOTE — Telephone Encounter (Signed)
Requested Prescriptions  Pending Prescriptions Disp Refills  . ONETOUCH VERIO test strip [employer] Med Name: ONE TOUCH VERIO TEST STRIP] 100 strip 3    Sig: USE AS DIRECTED     Endocrinology: Diabetes - Testing Supplies Passed - 12/10/2020 11:09 AM      Passed - Valid encounter within last 12 months    Recent Outpatient Visits          1 week ago Encounter for physical examination   [employer], [place], PA-C   1 month ago Acute cystitis with hematuria   [place], Lauren A, NP   2 months ago Type 2 diabetes mellitus with hyperglycemia, without long-term current use of insulin [facility])   [facility], Dionne Bucy, MD   7 months ago [place], Kelby Aline, FNP   9 months ago [place], Clearnce Sorrel, [place]      Future Appointments            In 2 months Thedore Mins, Ria Comment, PA-C [employer], [place]

## 2020-12-27 ENCOUNTER — Ambulatory Visit: Payer: Managed Care, Other (non HMO) | Admitting: Nurse Practitioner

## 2020-12-28 ENCOUNTER — Other Ambulatory Visit: Payer: Self-pay | Admitting: Physician Assistant

## 2020-12-28 DIAGNOSIS — M25572 Pain in left ankle and joints of left foot: Secondary | ICD-10-CM

## 2021-01-08 ENCOUNTER — Encounter: Payer: Managed Care, Other (non HMO) | Admitting: Family Medicine

## 2021-01-30 ENCOUNTER — Telehealth: Payer: Managed Care, Other (non HMO) | Admitting: Physician Assistant

## 2021-01-30 DIAGNOSIS — J019 Acute sinusitis, unspecified: Secondary | ICD-10-CM | POA: Diagnosis not present

## 2021-01-30 DIAGNOSIS — B9789 Other viral agents as the cause of diseases classified elsewhere: Secondary | ICD-10-CM | POA: Diagnosis not present

## 2021-01-30 MED ORDER — IPRATROPIUM BROMIDE 0.03 % NA SOLN: 2.0000 | Freq: Two times a day (BID) | NASAL | 0 refills | Status: DC

## 2021-01-30 MED ORDER — PREDNISONE 20 MG PO TABS: 40.0000 mg | ORAL_TABLET | Freq: Every day | ORAL | 0 refills | Status: DC

## 2021-01-30 NOTE — Progress Notes (Signed)
E-Visit for Sinus Problems  We are sorry that you are not feeling well.  Here is how we plan to help!  Based on what you have shared with me it looks like you have sinusitis.  Sinusitis is inflammation and infection in the sinus cavities of the head.  Based on your presentation I believe you most likely have Acute Viral Sinusitis.This is an infection most likely caused by a virus. There is not specific treatment for viral sinusitis other than to help you with the symptoms until the infection runs its course.  You may use an oral decongestant such as Mucinex D or if you have glaucoma or high blood pressure use plain Mucinex. Saline nasal spray help and can safely be used as often as needed for congestion, I have prescribed: Ipratropium Bromide nasal spray 0.03% 2 sprays in eah nostril 2-3 times a day and Prednisone 20mg  Take 2 tablets daily with breakfast for 5 days  Some authorities believe that zinc sprays or the use of Echinacea may shorten the course of your symptoms.  Sinus infections are not as easily transmitted as other respiratory infection, however we still recommend that you avoid close contact with loved ones, especially the very young and elderly.  Remember to wash your hands thoroughly throughout the day as this is the number one way to prevent the spread of infection!  Home Care: Only take medications as instructed by your medical team. Do not take these medications with alcohol. A steam or ultrasonic humidifier can help congestion.  You can place a towel over your head and breathe in the steam from hot water coming from a faucet. Avoid close contacts especially the very young and the elderly. Cover your mouth when you cough or sneeze. Always remember to wash your hands.  Get Help Right Away If: You develop worsening fever or sinus pain. You develop a severe head ache or visual changes. Your symptoms persist after you have completed your treatment plan.  Make sure you Understand  these instructions. Will watch your condition. Will get help right away if you are not doing well or get worse.   Thank you for choosing an e-visit.  Your e-visit answers were reviewed by a board certified advanced clinical practitioner to complete your personal care plan. Depending upon the condition, your plan could have included both over the counter or prescription medications.  Please review your pharmacy choice. Make sure the pharmacy is open so you can pick up prescription now. If there is a problem, you may contact your provider through [employer] and have the prescription routed to another pharmacy.  Your safety is important to [place]. If you have drug allergies check your prescription carefully.   For the next 24 hours you can use MyChart to ask questions about today's visit, request a non-urgent call back, or ask for a work or school excuse. You will get an email in the next two days asking about your experience. I hope that your e-visit has been valuable and will speed your recovery.  I provided 5 minutes of non face-to-face time during this encounter for chart review and documentation.

## 2021-02-09 ENCOUNTER — Other Ambulatory Visit: Payer: Self-pay | Admitting: Physician Assistant

## 2021-02-09 DIAGNOSIS — M25572 Pain in left ankle and joints of left foot: Secondary | ICD-10-CM

## 2021-03-01 ENCOUNTER — Other Ambulatory Visit: Payer: Self-pay | Admitting: Physician Assistant

## 2021-03-01 DIAGNOSIS — E1165 Type 2 diabetes mellitus with hyperglycemia: Secondary | ICD-10-CM

## 2021-03-01 NOTE — Telephone Encounter (Signed)
Requested medication (s) are due for refill today: yes  Requested medication (s) are on the active medication list: yes  Last refill:  12/04/20  Future visit scheduled: 03/09/21  Notes to clinic:  There is not a protocol to follow to determine this med, please assess.     Requested Prescriptions  Pending Prescriptions Disp Refills   MOUNJARO 5 MG/0.5ML Pen [Pharmacy Med Name: MOUNJARO 5 MG/0.5 ML PEN]  1    Sig: INJECT 5 MG INTO THE SKIN ONCE A WEEK. START AFTER 2.5 MG INJECTIONS ARE COMPLETED.     There is no refill protocol information for this order

## 2021-03-02 ENCOUNTER — Ambulatory Visit: Payer: Managed Care, Other (non HMO) | Admitting: Physician Assistant

## 2021-03-09 ENCOUNTER — Ambulatory Visit: Payer: Managed Care, Other (non HMO) | Admitting: Physician Assistant

## 2021-03-09 ENCOUNTER — Other Ambulatory Visit: Payer: Self-pay

## 2021-03-09 ENCOUNTER — Encounter: Payer: Self-pay | Admitting: Physician Assistant

## 2021-03-09 VITALS — BP 122/89 | Temp 98.4°F | Ht 67.0 in | Wt 309.0 lb

## 2021-03-09 DIAGNOSIS — Z720 Tobacco use: Secondary | ICD-10-CM | POA: Diagnosis not present

## 2021-03-09 DIAGNOSIS — R030 Elevated blood-pressure reading, without diagnosis of hypertension: Secondary | ICD-10-CM | POA: Insufficient documentation

## 2021-03-09 DIAGNOSIS — E1165 Type 2 diabetes mellitus with hyperglycemia: Secondary | ICD-10-CM | POA: Diagnosis not present

## 2021-03-09 DIAGNOSIS — Z716 Tobacco abuse counseling: Secondary | ICD-10-CM | POA: Diagnosis not present

## 2021-03-09 DIAGNOSIS — Z1231 Encounter for screening mammogram for malignant neoplasm of breast: Secondary | ICD-10-CM

## 2021-03-09 LAB — POCT GLYCOSYLATED HEMOGLOBIN (HGB A1C): Hemoglobin A1C: 6.7 % — AB (ref 4.0–5.6)

## 2021-03-09 MED ORDER — MOUNJARO 5 MG/0.5ML ~~LOC~~ SOAJ: 5.0000 mg | SUBCUTANEOUS | 4 refills | Status: DC

## 2021-03-09 MED ORDER — BLOOD PRESSURE KIT DEVI: 0 refills | Status: DC

## 2021-03-09 NOTE — Assessment & Plan Note (Signed)
Improved on repeat. On lisinopril 5 mg for proteinuria. Will recheck proteinuria and bp at next visit Advised she occasionally check at home

## 2021-03-09 NOTE — Assessment & Plan Note (Signed)
Her job recently removed the ability to smoke on premises, she thinks she will have to quit now. Will revisit

## 2021-03-09 NOTE — Progress Notes (Signed)
I,Leslie Morris,acting as a Neurosurgeon for [employer], PA-C.,have documented all relevant documentation on the behalf of Leslie Ferguson, PA-C,as directed by  Leslie Ferguson, PA-C while in the presence of Leslie Ferguson, PA-C.   Established patient visit   Patient: Leslie Morris   DOB: Apr 08, 1970   52 y.o. Female  MRN: [id number] Visit Date: 03/09/2021  Today's healthcare provider: [name], PA-C   DMII f/u  Subjective    HPI  Was taking prednisone 3 weeks ago for sinusitis, which was actually covid19  Diabetes Mellitus Type II, Follow-up  Lab Results  Component Value Date   HGBA1C 6.7 (A) 03/09/2021   HGBA1C 8.0 (A) 11/30/2020   HGBA1C 7.7 (A) 10/03/2020   Wt Readings from Last 3 Encounters:  03/09/21 (!) 309 lb (140.2 kg)  11/30/20 (!) 321 lb (145.6 kg)  11/06/20 (!) 325 lb 12.8 oz (147.8 kg)   Last seen for diabetes 3 months ago.  Management since then includes start at 2.5 mg weekly for four weeks, then increase to 5 mg weekly and no changes to jardiance. She reports excellent compliance with treatment. She is having side effects. nausea Symptoms: No fatigue No foot ulcerations  No appetite changes No nausea  No paresthesia of the feet  No polydipsia  No polyuria No visual disturbances   No vomiting     Home blood sugar records: fasting range: 113-140  Episodes of hypoglycemia? No    Current insulin regiment: Inject 5mg  into skin once a week. Most Recent Eye Exam: over a year ago Current exercise: none Current diet habits: chicken and frozen vegetables Leslie Morris has made large changes in her diet, no longer craves the food she usually does, her vice currently is diet Dr Stark Bray, which she has cut down on. Sweets occasionally. She did feel nauseous and sick when first starting the medication, but now only feels nauseous occasionally, and it does not bother her that much.   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 Has a solid friend group and support.   Pertinent Labs: Lab Results  Component Value Date   CHOL 164 10/03/2020   HDL 32 (L) 10/03/2020   LDLCALC 91 10/03/2020   TRIG 242 (H) 10/03/2020   CHOLHDL 5.1 (H) 10/03/2020   Lab Results  Component Value Date   NA 142 10/03/2020   K 4.6 10/03/2020   CREATININE 0.66 10/03/2020   EGFR 109 10/03/2020   MICROALBUR 50 11/30/2020   LABMICR See below: 11/06/2020     ---------------------------------------------------------------------------------------------------   Medications: Outpatient Medications Prior to Visit  Medication Sig   albuterol (VENTOLIN HFA) 108 (90 Base) MCG/ACT inhaler TAKE 2 PUFFS BY MOUTH EVERY 6 HOURS AS NEEDED FOR WHEEZE OR SHORTNESS OF BREATH   aspirin EC 81 MG tablet Take 81 mg by mouth daily.   atorvastatin (LIPITOR) 10 MG tablet TAKE 1 TABLET BY MOUTH EVERY DAY   celecoxib (CELEBREX) 100 MG capsule TAKE 1 CAPSULE BY MOUTH TWICE A DAY   clobetasol ointment (TEMOVATE) 0.05 % Apply 1 application topically 2 (two) times daily. DO NOT USE ON FACE, GENITALS, OR IN SKIN FOLDS. WASH HANDS FOLLOWING USE.   empagliflozin (JARDIANCE) 25 MG TABS tablet Take 1 tablet (25 mg total) by mouth daily.   ipratropium (ATROVENT) 0.03 % nasal spray Place 2 sprays into both nostrils every 12 (twelve) hours.   lisinopril (ZESTRIL) 5 MG tablet Take 1 tablet (5 mg  total) by mouth daily.   metFORMIN (GLUCOPHAGE) 1000 MG tablet TAKE 1 TABLET (1,000 MG TOTAL) BY MOUTH 2 (TWO) TIMES DAILY WITH A MEAL.   OneTouch Delica Lancets 46O MISC USE AS DIRECTED 3 TIMES A DAY   ONETOUCH VERIO test strip USE AS DIRECTED   oxybutynin (DITROPAN) 5 MG tablet TAKE 1 TABLET BY MOUTH THREE TIMES A DAY   [DISCONTINUED] tirzepatide (MOUNJARO) 5 MG/0.5ML Pen INJECT 5 MG INTO THE SKIN ONCE A WEEK. START AFTER 2.5 MG INJECTIONS ARE COMPLETED.   [DISCONTINUED] Misc. Devices (PULSE OXIMETER) MISC To use to monitor oxygen levels  daily (Patient not taking: Reported on 03/09/2021)   [DISCONTINUED] predniSONE (DELTASONE) 20 MG tablet Take 2 tablets (40 mg total) by mouth daily with breakfast. (Patient not taking: Reported on 03/09/2021)   No facility-administered medications prior to visit.    Review of Systems  Constitutional:  Negative for fever.  Respiratory:  Negative for cough and shortness of breath.   Cardiovascular:  Negative for chest pain.  Gastrointestinal:  Positive for nausea.  Neurological:  Negative for light-headedness and headaches.     Objective    BP 122/89 (BP Location: Right Arm, Patient Position: Sitting, Cuff Size: Large)    Temp 98.4 F (36.9 C) (Oral)    Ht $R'5\' 7"'jd$  (1.702 m)    Wt (!) 309 lb (140.2 kg)    LMP 11/30/1998 (Approximate) Comment: Hysterectomy at age 52 yo   SpO2 (!) 76%    BMI 48.40 kg/m  Blood pressure 122/89, temperature 98.4 F (36.9 C), temperature source Oral, height $RemoveBefo'5\' 7"'sieXCUTucFs$  (1.702 m), weight (!) 309 lb (140.2 kg), last menstrual period 11/30/1998, SpO2 (!) 76 %.   Physical Exam Constitutional:      General: She is awake.     Appearance: She is well-developed.  HENT:     Head: Normocephalic.  Eyes:     Conjunctiva/sclera: Conjunctivae normal.  Cardiovascular:     Rate and Rhythm: Normal rate and regular rhythm.     Heart sounds: Normal heart sounds.  Pulmonary:     Effort: Pulmonary effort is normal.     Breath sounds: Normal breath sounds.  Skin:    General: Skin is warm.  Neurological:     Mental Status: She is alert and oriented to person, place, and time.  Psychiatric:        Attention and Perception: Attention normal.        Mood and Affect: Mood normal.        Speech: Speech normal.        Behavior: Behavior is cooperative.    Hemoglobin A1C Latest Ref Rng & Units 03/09/2021 11/30/2020 10/03/2020  HGBA1C 4.0 - 5.6 % 6.7(A) 8.0(A) 7.7(A)  Some recent data might be hidden   Results for orders placed or performed in visit on 03/09/21  POCT HgB A1C  Result  Value Ref Range   Hemoglobin A1C 6.7 (A) 4.0 - 5.6 %    Assessment & Plan     Problem List Items Addressed This Visit       Endocrine   Type 2 diabetes mellitus with hyperglycemia, without long-term current use of insulin (HCC) - Primary    A1c today 6.7% and weight 309 lb down from 321 lb last visit Feeling and doing very well We will continue w/ $RemoveBef'5mg'toZnZoPgUr$  weekly injections Discussed therapy for ongoing and anxiety and body dysmorphia, pt declines as of now but has thought about it before      Relevant Medications  tirzepatide [facility]) 5 MG/0.5ML Pen   Other Relevant Orders   POCT HgB A1C (Completed)     Other   Encounter for smoking cessation counseling    Her job recently removed the ability to smoke on premises, she thinks she will have to quit now. Will revisit      Elevated blood pressure reading    Improved on repeat. On lisinopril 5 mg for proteinuria. Will recheck proteinuria and bp at next visit Advised she occasionally check at home      Relevant Medications   Blood Pressure Monitoring (BLOOD PRESSURE KIT) DEVI   Other Visit Diagnoses     Morbid obesity ([place])       Relevant Medications   tirzepatide (MOUNJARO) 5 MG/0.5ML Pen   Tobacco use       Encounter for screening mammogram for breast cancer       Relevant Orders   MM 3D SCREEN BREAST BILATERAL        Return in about 3 months (around 06/07/2021) for DMII.      I, [name], PA-C have reviewed all documentation for this visit. The documentation on  03/09/2021 for the exam, diagnosis, procedures, and orders are all accurate and complete.    [name], PA-C  [facility] [phone] (phone) [phone] (fax)  [place]

## 2021-03-09 NOTE — Assessment & Plan Note (Signed)
A1c today 6.7% and weight 309 lb down from 321 lb last visit Feeling and doing very well We will continue w/ 5mg  weekly injections Discussed therapy for ongoing and anxiety and body dysmorphia, pt declines as of now but has thought about it before

## 2021-03-14 ENCOUNTER — Encounter: Payer: Self-pay | Admitting: Physician Assistant

## 2021-03-14 ENCOUNTER — Other Ambulatory Visit: Payer: Self-pay | Admitting: Physician Assistant

## 2021-03-14 DIAGNOSIS — E1165 Type 2 diabetes mellitus with hyperglycemia: Secondary | ICD-10-CM

## 2021-03-14 MED ORDER — MOUNJARO 5 MG/0.5ML ~~LOC~~ SOAJ: 5.0000 mg | SUBCUTANEOUS | 1 refills | Status: DC

## 2021-03-15 ENCOUNTER — Other Ambulatory Visit: Payer: Self-pay | Admitting: Family Medicine

## 2021-03-15 DIAGNOSIS — G8929 Other chronic pain: Secondary | ICD-10-CM

## 2021-03-15 NOTE — Telephone Encounter (Signed)
Requested medication (s) are due for refill today - yes  Requested medication (s) are on the active medication list -yes  Future visit scheduled -yes  Last refill: 02/09/21 #60  Notes to clinic: Request RF: fails lab protocol  Requested Prescriptions  Pending Prescriptions Disp Refills   celecoxib (CELEBREX) 100 MG capsule [Pharmacy Med Name: CELECOXIB 100 MG CAPSULE] 60 capsule 0    Sig: TAKE 1 CAPSULE BY MOUTH TWICE A DAY     Analgesics:  COX2 Inhibitors Failed - 03/15/2021  2:09 AM      Failed - Manual Review: Labs are only required if the patient has taken medication for more than 8 weeks.      Failed - HGB in normal range and within 360 days    Hemoglobin  Date Value Ref Range Status  11/22/2019 14.3 11.1 - 15.9 g/dL Final          Failed - HCT in normal range and within 360 days    Hematocrit  Date Value Ref Range Status  11/22/2019 43.9 34.0 - 46.6 % Final          Passed - Cr in normal range and within 360 days    Creatinine  Date Value Ref Range Status  09/29/2012 0.68 0.60 - 1.30 mg/dL Final   Creatinine, Ser  Date Value Ref Range Status  10/03/2020 0.66 0.57 - 1.00 mg/dL Final          Passed - AST in normal range and within 360 days    AST  Date Value Ref Range Status  10/03/2020 25 0 - 40 IU/L Final          Passed - ALT in normal range and within 360 days    ALT  Date Value Ref Range Status  10/03/2020 19 0 - 32 IU/L Final          Passed - eGFR is 30 or above and within 360 days    EGFR (African American)  Date Value Ref Range Status  09/29/2012 >60  Final   GFR calc Af Amer  Date Value Ref Range Status  11/22/2019 114 >59 mL/min/1.73 Final    Comment:    **[facility] currently reports eGFR in compliance with the current**   recommendations of the [employer]. [facility] will   update reporting as new guidelines are published from the NKF-ASN   Task force.    EGFR (Non-African Amer.)  Date Value Ref Range Status   09/29/2012 >60  Final    Comment:    eGFR values <48mL/min/1.73 m2 may be an indication of chronic kidney disease (CKD). Calculated eGFR is useful in patients with stable renal function. The eGFR calculation will not be reliable in acutely ill patients when serum creatinine is changing rapidly. It is not useful in  patients on dialysis. The eGFR calculation may not be applicable to patients at the low and high extremes of body sizes, pregnant women, and vegetarians.    GFR calc non Af Amer  Date Value Ref Range Status  11/22/2019 99 >59 mL/min/1.73 Final   eGFR  Date Value Ref Range Status  10/03/2020 109 >59 mL/min/1.73 Final          Passed - Patient is not pregnant      Passed - Valid encounter within last 12 months    Recent Outpatient Visits           6 days ago Type 2 diabetes mellitus with hyperglycemia, without long-term current use of  insulin [facility])   [facility] Thedore Mins, [place], PA-C   3 months ago Encounter for physical examination   [facility] Thedore Mins, [place], PA-C   4 months ago Acute cystitis with hematuria   [place], Lauren A, NP   5 months ago Type 2 diabetes mellitus with hyperglycemia, without long-term current use of insulin [facility])   [facility], Dionne Bucy, MD   10 months ago [place], Kelby Aline, FNP       Future Appointments             In 2 months Drubel, Ria Comment, PA-C [employer], [facility]               Requested Prescriptions  Pending Prescriptions Disp Refills   celecoxib (CELEBREX) 100 MG capsule [Pharmacy Med Name: CELECOXIB 100 MG CAPSULE] 60 capsule 0    Sig: TAKE 1 CAPSULE BY MOUTH TWICE A DAY     Analgesics:  COX2 Inhibitors Failed - 03/15/2021  2:09 AM      Failed - Manual Review: Labs are only required if the patient has taken medication for more than 8 weeks.      Failed - HGB in normal range and  within 360 days    Hemoglobin  Date Value Ref Range Status  11/22/2019 14.3 11.1 - 15.9 g/dL Final          Failed - HCT in normal range and within 360 days    Hematocrit  Date Value Ref Range Status  11/22/2019 43.9 34.0 - 46.6 % Final          Passed - Cr in normal range and within 360 days    Creatinine  Date Value Ref Range Status  09/29/2012 0.68 0.60 - 1.30 mg/dL Final   Creatinine, Ser  Date Value Ref Range Status  10/03/2020 0.66 0.57 - 1.00 mg/dL Final          Passed - AST in normal range and within 360 days    AST  Date Value Ref Range Status  10/03/2020 25 0 - 40 IU/L Final          Passed - ALT in normal range and within 360 days    ALT  Date Value Ref Range Status  10/03/2020 19 0 - 32 IU/L Final          Passed - eGFR is 30 or above and within 360 days    EGFR (African American)  Date Value Ref Range Status  09/29/2012 >60  Final   GFR calc Af Amer  Date Value Ref Range Status  11/22/2019 114 >59 mL/min/1.73 Final    Comment:    **[facility] currently reports eGFR in compliance with the current**   recommendations of the [employer]. [facility] will   update reporting as new guidelines are published from the NKF-ASN   Task force.    EGFR (Non-African Amer.)  Date Value Ref Range Status  09/29/2012 >60  Final    Comment:    eGFR values <54mL/min/1.73 m2 may be an indication of chronic kidney disease (CKD). Calculated eGFR is useful in patients with stable renal function. The eGFR calculation will not be reliable in acutely ill patients when serum creatinine is changing rapidly. It is not useful in  patients on dialysis. The eGFR calculation may not be applicable to patients at the low and high extremes of body sizes, pregnant women, and vegetarians.  GFR calc non Af Amer  Date Value Ref Range Status  11/22/2019 99 >59 mL/min/1.73 Final   eGFR  Date Value Ref Range Status  10/03/2020 109 >59 mL/min/1.73 Final           Passed - Patient is not pregnant      Passed - Valid encounter within last 12 months    Recent Outpatient Visits           6 days ago Type 2 diabetes mellitus with hyperglycemia, without long-term current use of insulin ([place])   [facility] Thedore Mins, [place], PA-C   3 months ago Encounter for physical examination   [employer], [place], PA-C   4 months ago Acute cystitis with hematuria   [place], Lauren A, NP   5 months ago Type 2 diabetes mellitus with hyperglycemia, without long-term current use of insulin [facility])   [facility], Dionne Bucy, MD   10 months ago [place], Kelby Aline, FNP       Future Appointments             In 2 months Drubel, Ria Comment, PA-C [employer], [place]

## 2021-03-23 ENCOUNTER — Other Ambulatory Visit: Payer: Self-pay | Admitting: Physician Assistant

## 2021-03-23 DIAGNOSIS — N393 Stress incontinence (female) (male): Secondary | ICD-10-CM

## 2021-03-23 MED ORDER — OXYBUTYNIN CHLORIDE 5 MG PO TABS: 5.0000 mg | ORAL_TABLET | Freq: Three times a day (TID) | ORAL | 0 refills | Status: DC

## 2021-03-28 ENCOUNTER — Other Ambulatory Visit: Payer: Self-pay | Admitting: Family Medicine

## 2021-03-28 DIAGNOSIS — R300 Dysuria: Secondary | ICD-10-CM

## 2021-03-28 DIAGNOSIS — E1165 Type 2 diabetes mellitus with hyperglycemia: Secondary | ICD-10-CM

## 2021-03-29 ENCOUNTER — Encounter: Payer: Self-pay | Admitting: Physician Assistant

## 2021-03-29 NOTE — Telephone Encounter (Signed)
Requested Prescriptions  Pending Prescriptions Disp Refills   JARDIANCE 25 MG TABS tablet [Pharmacy Med Name: JARDIANCE 25 MG TABLET] 90 tablet 1    Sig: TAKE 1 TABLET (25 MG TOTAL) BY MOUTH DAILY.     Endocrinology:  Diabetes - SGLT2 Inhibitors Passed - 03/28/2021  6:00 PM      Passed - Cr in normal range and within 360 days    Creatinine  Date Value Ref Range Status  09/29/2012 0.68 0.60 - 1.30 mg/dL Final   Creatinine, Ser  Date Value Ref Range Status  10/03/2020 0.66 0.57 - 1.00 mg/dL Final         Passed - HBA1C is between 0 and 7.9 and within 180 days    Hemoglobin A1C  Date Value Ref Range Status  03/09/2021 6.7 (A) 4.0 - 5.6 % Final   Hgb A1c MFr Bld  Date Value Ref Range Status  11/22/2019 6.9 (H) 4.8 - 5.6 % Final    Comment:             Prediabetes: 5.7 - 6.4          Diabetes: >6.4          Glycemic control for adults with diabetes: <7.0          Passed - eGFR in normal range and within 360 days    EGFR (African American)  Date Value Ref Range Status  09/29/2012 >60  Final   GFR calc Af Amer  Date Value Ref Range Status  11/22/2019 114 >59 mL/min/1.73 Final    Comment:    **[facility] currently reports eGFR in compliance with the current**   recommendations of the [employer]. [facility] will   update reporting as new guidelines are published from the NKF-ASN   Task force.    EGFR (Non-African Amer.)  Date Value Ref Range Status  09/29/2012 >60  Final    Comment:    eGFR values <50m/min/1.73 m2 may be an indication of chronic kidney disease (CKD). Calculated eGFR is useful in patients with stable renal function. The eGFR calculation will not be reliable in acutely ill patients when serum creatinine is changing rapidly. It is not useful in  patients on dialysis. The eGFR calculation may not be applicable to patients at the low and high extremes of body sizes, pregnant women, and vegetarians.    GFR calc non Af Amer  Date Value Ref  Range Status  11/22/2019 99 >59 mL/min/1.73 Final   eGFR  Date Value Ref Range Status  10/03/2020 109 >59 mL/min/1.73 Final         Passed - Valid encounter within last 6 months    Recent Outpatient Visits          2 weeks ago Type 2 diabetes mellitus with hyperglycemia, without long-term current use of insulin ([facility]   [facility] [place] [place] [facility]   3 months ago Encounter for physical examination   [employer] [place] PA-C   4 months ago Acute cystitis with hematuria   [place] Lauren A, NP   5 months ago Type 2 diabetes mellitus with hyperglycemia, without long-term current use of insulin ([facility]   [facility]   10 months ago [place] Aline FNP      Future Appointments            In 2 months Drubel, LRia Comment PA-C [facility] [employer] [place]

## 2021-03-29 NOTE — Telephone Encounter (Signed)
Requested medications are due for refill today.  yes  Requested medications are on the active medications list.  yes  Last refill. 10/08/2020 #180 1 refill  Future visit scheduled.   yes  Notes to clinic.  Failed protocol d/t expired labs.    Requested Prescriptions  Pending Prescriptions Disp Refills   metFORMIN (GLUCOPHAGE) 1000 MG tablet [Pharmacy Med Name: METFORMIN HCL 1,000 MG TABLET] 180 tablet 1    Sig: TAKE 1 TABLET (1,000 MG TOTAL) BY MOUTH 2 (TWO) TIMES DAILY WITH A MEAL.     Endocrinology:  Diabetes - Biguanides Failed - 03/28/2021  6:00 PM      Failed - B12 Level in normal range and within 720 days    No results found for: VITAMINB12        Failed - CBC within normal limits and completed in the last 12 months    WBC  Date Value Ref Range Status  11/22/2019 11.4 (H) 3.4 - 10.8 x10E3/uL Final  02/09/2018 8.6 4.0 - 10.5 K/uL Final   RBC  Date Value Ref Range Status  11/22/2019 5.28 3.77 - 5.28 x10E6/uL Final  02/09/2018 4.90 3.87 - 5.11 MIL/uL Final   Hemoglobin  Date Value Ref Range Status  11/22/2019 14.3 11.1 - 15.9 g/dL Final   Hematocrit  Date Value Ref Range Status  11/22/2019 43.9 34.0 - 46.6 % Final   MCHC  Date Value Ref Range Status  11/22/2019 32.6 31.5 - 35.7 g/dL Final  02/09/2018 32.0 30.0 - 36.0 g/dL Final   [facility]  Date Value Ref Range Status  11/22/2019 27.1 26.6 - 33.0 pg Final  02/09/2018 27.6 26.0 - 34.0 pg Final   MCV  Date Value Ref Range Status  11/22/2019 83 79 - 97 fL Final   No results found for: PLTCOUNTKUC, LABPLAT, POCPLA RDW  Date Value Ref Range Status  11/22/2019 14.8 11.7 - 15.4 % Final         Passed - Cr in normal range and within 360 days    Creatinine  Date Value Ref Range Status  09/29/2012 0.68 0.60 - 1.30 mg/dL Final   Creatinine, Ser  Date Value Ref Range Status  10/03/2020 0.66 0.57 - 1.00 mg/dL Final          Passed - HBA1C is between 0 and 7.9 and within 180 days    Hemoglobin A1C  Date Value  Ref Range Status  03/09/2021 6.7 (A) 4.0 - 5.6 % Final   Hgb A1c MFr Bld  Date Value Ref Range Status  11/22/2019 6.9 (H) 4.8 - 5.6 % Final    Comment:             Prediabetes: 5.7 - 6.4          Diabetes: >6.4          Glycemic control for adults with diabetes: <7.0           Passed - eGFR in normal range and within 360 days    EGFR (African American)  Date Value Ref Range Status  09/29/2012 >60  Final   GFR calc Af Amer  Date Value Ref Range Status  11/22/2019 114 >59 mL/min/1.73 Final    Comment:    **[facility] currently reports eGFR in compliance with the current**   recommendations of the [employer]. [facility] will   update reporting as new guidelines are published from the NKF-ASN   Task force.    EGFR (Non-African Amer.)  Date Value Ref Range Status  09/29/2012 >60  Final    Comment:    eGFR values <38mL/min/1.73 m2 may be an indication of chronic kidney disease (CKD). Calculated eGFR is useful in patients with stable renal function. The eGFR calculation will not be reliable in acutely ill patients when serum creatinine is changing rapidly. It is not useful in  patients on dialysis. The eGFR calculation may not be applicable to patients at the low and high extremes of body sizes, pregnant women, and vegetarians.    GFR calc non Af Amer  Date Value Ref Range Status  11/22/2019 99 >59 mL/min/1.73 Final   eGFR  Date Value Ref Range Status  10/03/2020 109 >59 mL/min/1.73 Final          Passed - Valid encounter within last 6 months    Recent Outpatient Visits           2 weeks ago Type 2 diabetes mellitus with hyperglycemia, without long-term current use of insulin [facility])   [facility] Thedore Mins, [place], PA-C   3 months ago Encounter for physical examination   [employer], [place], PA-C   4 months ago Acute cystitis with hematuria   [place], Lauren A, NP   5 months ago Type 2  diabetes mellitus with hyperglycemia, without long-term current use of insulin [facility])   [facility] [place], Dionne Bucy, MD   10 months ago [place], Kelby Aline, FNP       Future Appointments             In 2 months Drubel, Ria Comment, PA-C [employer], [place]

## 2021-03-30 ENCOUNTER — Telehealth: Payer: Self-pay | Admitting: Physician Assistant

## 2021-03-30 NOTE — Telephone Encounter (Signed)
Pt states [facility] told her they have not gotten the prior auth from the office. Pt is concerned she may not get her medicine in time to take her dose.

## 2021-04-15 ENCOUNTER — Other Ambulatory Visit: Payer: Self-pay | Admitting: Physician Assistant

## 2021-04-15 DIAGNOSIS — G8929 Other chronic pain: Secondary | ICD-10-CM

## 2021-04-16 ENCOUNTER — Ambulatory Visit
Admission: RE | Admit: 2021-04-16 | Discharge: 2021-04-16 | Disposition: A | Discharge status: Home / Self Care | Payer: Managed Care, Other (non HMO) | Source: Ambulatory Visit | Attending: Physician Assistant | Admitting: Physician Assistant

## 2021-04-16 ENCOUNTER — Other Ambulatory Visit: Payer: Self-pay

## 2021-04-16 DIAGNOSIS — Z1231 Encounter for screening mammogram for malignant neoplasm of breast: Secondary | ICD-10-CM | POA: Insufficient documentation

## 2021-04-17 ENCOUNTER — Other Ambulatory Visit: Payer: Self-pay | Admitting: Physician Assistant

## 2021-04-17 DIAGNOSIS — N63 Unspecified lump in unspecified breast: Secondary | ICD-10-CM

## 2021-04-17 DIAGNOSIS — R928 Other abnormal and inconclusive findings on diagnostic imaging of breast: Secondary | ICD-10-CM

## 2021-05-03 ENCOUNTER — Ambulatory Visit
Admission: RE | Admit: 2021-05-03 | Discharge: 2021-05-03 | Disposition: A | Discharge status: Home / Self Care | Payer: Managed Care, Other (non HMO) | Source: Ambulatory Visit | Attending: Physician Assistant | Admitting: Physician Assistant

## 2021-05-03 ENCOUNTER — Other Ambulatory Visit: Payer: Self-pay

## 2021-05-03 ENCOUNTER — Other Ambulatory Visit: Payer: Self-pay | Admitting: Family Medicine

## 2021-05-03 DIAGNOSIS — R928 Other abnormal and inconclusive findings on diagnostic imaging of breast: Secondary | ICD-10-CM

## 2021-05-03 DIAGNOSIS — N63 Unspecified lump in unspecified breast: Secondary | ICD-10-CM

## 2021-05-03 DIAGNOSIS — E1169 Type 2 diabetes mellitus with other specified complication: Secondary | ICD-10-CM

## 2021-05-04 ENCOUNTER — Other Ambulatory Visit: Payer: Self-pay | Admitting: Physician Assistant

## 2021-05-04 DIAGNOSIS — N63 Unspecified lump in unspecified breast: Secondary | ICD-10-CM

## 2021-05-04 DIAGNOSIS — R928 Other abnormal and inconclusive findings on diagnostic imaging of breast: Secondary | ICD-10-CM

## 2021-05-30 NOTE — Progress Notes (Signed)
?  ? ?I,Leslie Morris,acting as a Education administrator for [employer], PA-C.,have documented all relevant documentation on the behalf of Leslie Kirschner, PA-C,as directed by  Leslie Kirschner, PA-C while in the presence of Leslie Kirschner, PA-C. ? ? ?Established patient visit ? ? ?Patient: Leslie Morris   DOB: [birth year]   51 y.o. Female  MRN: [id number] ?Visit Date: 05/31/2021 ? ?Today's healthcare provider: [name], PA-C  ? ?Cc. Lower back pain x 2 weeks ? ?Subjective  ?  ?HPI  ?[name] is a 51 y/o female who presents today with low back/buttock pain for 1.5-2 weeks. Improved with heat, minimal improvement with alleve/advil. Pain at worst will radiate down to top of thighs. Limitation to ROM. Denies injury, numbness, incontinence.  ? ? ?Patient reports back discomfort for 1.5 weeks. Patient reports taking aleve and ibuprofen with no relief and alternating ice and heat. Heat feels best but when she gets up its 10xs more stiff than before she used it. No incident to report. Discomfort fluctuates. Reports having concern before and given Toradol injection or prednisone. Discomfort is located across the very bottom of back ? ?Medications: ?Outpatient Medications Prior to Visit  ?Medication Sig  ? albuterol (VENTOLIN HFA) 108 (90 Base) MCG/ACT inhaler TAKE 2 PUFFS BY MOUTH EVERY 6 HOURS AS NEEDED FOR WHEEZE OR SHORTNESS OF BREATH  ? aspirin EC 81 MG tablet Take 81 mg by mouth daily.  ? atorvastatin (LIPITOR) 10 MG tablet TAKE 1 TABLET BY MOUTH EVERY DAY  ? Blood Pressure Monitoring (BLOOD PRESSURE KIT) DEVI Check bp in am and pm 2-3 times a week  ? celecoxib (CELEBREX) 100 MG capsule Take 1 capsule (100 mg total) by mouth 2 (two) times daily as needed for moderate pain.  ? clobetasol ointment (TEMOVATE) 6.38 % Apply 1 application topically 2 (two) times daily. DO NOT USE ON FACE, GENITALS, OR IN SKIN FOLDS. [facility] HANDS FOLLOWING USE.  ? ipratropium (ATROVENT) 0.03 % nasal spray Place 2 sprays into both nostrils every 12 (twelve)  hours.  ? JARDIANCE 25 MG TABS tablet TAKE 1 TABLET (25 MG TOTAL) BY MOUTH DAILY.  ? lisinopril (ZESTRIL) 5 MG tablet Take 1 tablet (5 mg total) by mouth daily.  ? metFORMIN (GLUCOPHAGE) 1000 MG tablet TAKE 1 TABLET (1,000 MG TOTAL) BY MOUTH 2 (TWO) TIMES DAILY WITH A MEAL.  ? OneTouch Delica Lancets 45X MISC USE AS DIRECTED 3 TIMES A DAY  ? ONETOUCH VERIO test strip USE AS DIRECTED  ? oxybutynin (DITROPAN) 5 MG tablet Take 1 tablet (5 mg total) by mouth 3 (three) times daily.  ? tirzepatide (MOUNJARO) 5 MG/0.5ML Pen Inject 5 mg into the skin once a week.  ? ?No facility-administered medications prior to visit.  ? ? ?Review of Systems  ?Constitutional:  Negative for fatigue and fever.  ?Respiratory:  Negative for cough and shortness of breath.   ?Cardiovascular:  Negative for chest pain and leg swelling.  ?Gastrointestinal:  Negative for abdominal pain.  ?Musculoskeletal:  Positive for back pain and gait problem.  ?Neurological:  Negative for dizziness and headaches.  ? ? ?  Objective  ?  ?Blood pressure 126/81, pulse 77, height _0  (1.702 m), weight (!) 303 lb 1.6 oz (137.5 kg), last menstrual period 11/30/1998, SpO2 100 %.  ? ?Physical Exam ?Vitals reviewed.  ?Constitutional:   ?   Appearance: She is not ill-appearing.  ?HENT:  ?   Head: Normocephalic.  ?Eyes:  ?   Conjunctiva/sclera: Conjunctivae normal.  ?Cardiovascular:  ?  Rate and Rhythm: Normal rate.  ?Pulmonary:  ?   Effort: Pulmonary effort is normal. No respiratory distress.  ?Musculoskeletal:  ?   Lumbar back: Spasms and tenderness present. No swelling.  ?Neurological:  ?   General: No focal deficit present.  ?   Mental Status: She is alert and oriented to person, place, and time.  ?Psychiatric:     ?   Mood and Affect: Mood normal.     ?   Behavior: Behavior normal.  ?  ? ?No results found for any visits on 05/31/21. ? Assessment & Plan  ?  ? ?Lumbar strain ?Rx medrol dose pack  ?Rx flexeril 5 mg qhs prn ?Heat, stretching ?Would rather she take  celebrex BID instead of the alleve/advil and celebrex at night ? ?I, Leslie Kirschner, PA-C have reviewed all documentation for this visit. The documentation on  05/31/2021  for the exam, diagnosis, procedures, and orders are all accurate and complete. ? ?Leslie Kirschner, PA-C ?[place] ?[address] ?[place], [place], [address] ?Office: [phone] ?Fax: [phone]  ? ?[facility] ? ?

## 2021-05-31 ENCOUNTER — Encounter: Payer: Self-pay | Admitting: Physician Assistant

## 2021-05-31 ENCOUNTER — Ambulatory Visit: Payer: Managed Care, Other (non HMO) | Admitting: Physician Assistant

## 2021-05-31 VITALS — BP 126/81 | HR 77 | Ht 67.0 in | Wt 303.1 lb

## 2021-05-31 DIAGNOSIS — S39012A Strain of muscle, fascia and tendon of lower back, initial encounter: Secondary | ICD-10-CM

## 2021-05-31 MED ORDER — CYCLOBENZAPRINE HCL 5 MG PO TABS: 5.0000 mg | ORAL_TABLET | Freq: Every evening | ORAL | 0 refills | Status: DC | PRN

## 2021-05-31 MED ORDER — METHYLPREDNISOLONE 4 MG PO TBPK: ORAL_TABLET | ORAL | 0 refills | Status: DC

## 2021-06-07 NOTE — Progress Notes (Signed)
?  ? ?I,[name],acting as a Education administrator for [employer], PA-C.,have documented all relevant documentation on the behalf of [name], PA-C,as directed by  [name], PA-C while in the presence of [name], PA-C. ? ? ?Established patient visit ? ? ?Patient: Leslie Morris   DOB: 28-Mar-1970   51 y.o. Female  MRN: [id number] ?Visit Date: 06/08/2021 ? ?Today's healthcare provider: [name], PA-C  ? ?Patient is being seen today for 3 month diabetes follow-up. ? ?Subjective  ?  ?HPI  ? ?Diabetes Mellitus Type II, Follow-up ? ?Lab Results  ?Component Value Date  ? HGBA1C 6.7 (A) 03/09/2021  ? HGBA1C 8.0 (A) 11/30/2020  ? HGBA1C 7.7 (A) 10/03/2020  ? ?Wt Readings from Last 3 Encounters:  ?06/08/21 298 lb 9.6 oz (135.4 kg)  ?05/31/21 (!) 303 lb 1.6 oz (137.5 kg)  ?03/09/21 (!) 309 lb (140.2 kg)  ? ?Last seen for diabetes 3 months ago.  ?Management since then includes continue w/ $RemoveBef'5mg'oiFhigpRbF$  weekly injections. ?She reports excellent compliance with treatment. ?She is not having side effects.  ?Symptoms: ?No fatigue No foot ulcerations  ?No appetite changes No nausea  ?No paresthesia of the feet  No polydipsia  ?No polyuria No visual disturbances   ?No vomiting   ? ? ?Home blood sugar records: fasting range: 131 this morning ? ?Episodes of hypoglycemia? Yes last week; light headed and cold-- did not check bs ?  ?Current insulin regiment:  ?Most Recent Eye Exam: need to update ?Current exercise: walking ?Current diet habits: in general, a "healthy" diet   ? ?Pertinent Labs: ?Lab Results  ?Component Value Date  ? CHOL 164 10/03/2020  ? HDL 32 (L) 10/03/2020  ? [place] 91 10/03/2020  ? TRIG 242 (H) 10/03/2020  ? CHOLHDL 5.1 (H) 10/03/2020  ? Lab Results  ?Component Value Date  ? NA 142 10/03/2020  ? K 4.6 10/03/2020  ? CREATININE 0.66 10/03/2020  ? EGFR 109 10/03/2020  ? MICROALBUR 50 11/30/2020  ? LABMICR See below: 11/06/2020  ?   ? ?--------------------------------------------------------------------------------------------------- ? ? ?Medications: ?Outpatient Medications Prior to Visit  ?Medication Sig  ? albuterol (VENTOLIN HFA) 108 (90 Base) MCG/ACT inhaler TAKE 2 PUFFS BY MOUTH EVERY 6 HOURS AS NEEDED FOR WHEEZE OR SHORTNESS OF BREATH  ? aspirin EC 81 MG tablet Take 81 mg by mouth daily.  ? atorvastatin (LIPITOR) 10 MG tablet TAKE 1 TABLET BY MOUTH EVERY DAY  ? Blood Pressure Monitoring (BLOOD PRESSURE KIT) DEVI Check bp in am and pm 2-3 times a week  ? celecoxib (CELEBREX) 100 MG capsule Take 1 capsule (100 mg total) by mouth 2 (two) times daily as needed for moderate pain.  ? clobetasol ointment (TEMOVATE) 1.32 % Apply 1 application topically 2 (two) times daily. DO NOT USE ON FACE, GENITALS, OR IN SKIN FOLDS. [facility] HANDS FOLLOWING USE.  ? cyclobenzaprine (FLEXERIL) 5 MG tablet Take 1 tablet (5 mg total) by mouth at bedtime as needed for muscle spasms.  ? ipratropium (ATROVENT) 0.03 % nasal spray Place 2 sprays into both nostrils every 12 (twelve) hours.  ? JARDIANCE 25 MG TABS tablet TAKE 1 TABLET (25 MG TOTAL) BY MOUTH DAILY.  ? lisinopril (ZESTRIL) 5 MG tablet Take 1 tablet (5 mg total) by mouth daily.  ? metFORMIN (GLUCOPHAGE) 1000 MG tablet TAKE 1 TABLET (1,000 MG TOTAL) BY MOUTH 2 (TWO) TIMES DAILY WITH A MEAL.  ? methylPREDNISolone (MEDROL DOSEPAK) 4 MG TBPK tablet Take 6 pills on day one then decrease by 1  pill each day  ? OneTouch Delica Lancets 24Q MISC USE AS DIRECTED 3 TIMES A DAY  ? ONETOUCH VERIO test strip USE AS DIRECTED  ? oxybutynin (DITROPAN) 5 MG tablet Take 1 tablet (5 mg total) by mouth 3 (three) times daily.  ? [DISCONTINUED] tirzepatide (MOUNJARO) 5 MG/0.5ML Pen Inject 5 mg into the skin once a week.  ? ?No facility-administered medications prior to visit.  ? ? ?Review of Systems  ?Constitutional:  Negative for fatigue and fever.  ?Respiratory:  Negative for cough and shortness of breath.   ?Cardiovascular:   Negative for chest pain and leg swelling.  ?Gastrointestinal:  Negative for abdominal pain.  ?Neurological:  Negative for dizziness and headaches.  ? ? ?  Objective  ?  ?Blood pressure 120/81, pulse 71, height $RemoveBe'5\' 7"'XfhVHgTYX$  (1.702 m), weight 298 lb 9.6 oz (135.4 kg), last menstrual period 11/30/1998, SpO2 100 %.  ? ?Physical Exam ?Constitutional:   ?   General: She is awake.  ?   Appearance: She is well-developed.  ?HENT:  ?   Head: Normocephalic.  ?Eyes:  ?   Conjunctiva/sclera: Conjunctivae normal.  ?Cardiovascular:  ?   Rate and Rhythm: Normal rate and regular rhythm.  ?   Heart sounds: Normal heart sounds.  ?Pulmonary:  ?   Effort: Pulmonary effort is normal.  ?   Breath sounds: Normal breath sounds.  ?Skin: ?   General: Skin is warm.  ?Neurological:  ?   Mental Status: She is alert and oriented to person, place, and time.  ?Psychiatric:     ?   Attention and Perception: Attention normal.     ?   Mood and Affect: Mood normal.     ?   Speech: Speech normal.     ?   Behavior: Behavior is cooperative.  ? ? ?No results found for any visits on 06/08/21. ? Assessment & Plan  ?  ? ?Problem List Items Addressed This Visit   ? ?  ? Endocrine  ? Hyperlipidemia associated with type 2 diabetes mellitus ([place])  ?  Will check fasting lipids  ?Currently managed with atorvastatin 10 mg ? ?  ?  ? Relevant Medications  ? tirzepatide (MOUNJARO) 7.5 MG/0.5ML Pen  ? Other Relevant Orders  ? Lipid Profile  ? Comprehensive Metabolic Panel (CMET)  ? Type 2 diabetes mellitus with hyperglycemia, without long-term current use of insulin (HCC) - Primary  ?  Had previously discussed increasing mounjaro to 7.5 mg  ?Will check A1c but as she only has 3 pens left and may need another PA, will send increased rx ? ?  ?  ? Relevant Medications  ? tirzepatide (MOUNJARO) 7.5 MG/0.5ML Pen  ? Other Relevant Orders  ? HgB A1c  ? Ambulatory referral to Ophthalmology  ? Comprehensive Metabolic Panel (CMET)  ?  ? Other  ? Morbid obesity ([place])  ?  Under 300 lbs!   ?Discussed PT in future to help support her back and knees with continued weight loss ? ?  ?  ? Relevant Medications  ? tirzepatide (MOUNJARO) 7.5 MG/0.5ML Pen  ? ?Other Visit Diagnoses   ? ? Encounter for screening for malignant neoplasm of colon      ? Relevant Orders  ? Ambulatory referral to Gastroenterology  ? ?  ?  ? ?Return in about 3 months (around 09/07/2021) for DMII, weight Management.  ?   ? ?I, [name], PA-C have reviewed all documentation for this visit. The documentation on  06/08/2021 for  the exam, diagnosis, procedures, and orders are all accurate and complete. ? ?[name], PA-C ?[facility] ?[address] ?[place], [place], [address] ?Office: [phone] ?Fax: [phone]  ? ?[facility] ? ?

## 2021-06-08 ENCOUNTER — Encounter: Payer: Self-pay | Admitting: Physician Assistant

## 2021-06-08 ENCOUNTER — Ambulatory Visit: Payer: Managed Care, Other (non HMO) | Admitting: Physician Assistant

## 2021-06-08 VITALS — BP 120/81 | HR 71 | Ht 67.0 in | Wt 298.6 lb

## 2021-06-08 DIAGNOSIS — Z1211 Encounter for screening for malignant neoplasm of colon: Secondary | ICD-10-CM | POA: Diagnosis not present

## 2021-06-08 DIAGNOSIS — E785 Hyperlipidemia, unspecified: Secondary | ICD-10-CM

## 2021-06-08 DIAGNOSIS — E1169 Type 2 diabetes mellitus with other specified complication: Secondary | ICD-10-CM

## 2021-06-08 DIAGNOSIS — E1165 Type 2 diabetes mellitus with hyperglycemia: Secondary | ICD-10-CM

## 2021-06-08 MED ORDER — TIRZEPATIDE 7.5 MG/0.5ML ~~LOC~~ SOAJ: 7.5000 mg | SUBCUTANEOUS | 1 refills | Status: DC

## 2021-06-08 NOTE — Assessment & Plan Note (Signed)
Will check fasting lipids  ?Currently managed with atorvastatin 10 mg ?

## 2021-06-08 NOTE — Assessment & Plan Note (Signed)
Under 300 lbs!  ?Discussed PT in future to help support her back and knees with continued weight loss ?

## 2021-06-08 NOTE — Assessment & Plan Note (Signed)
Had previously discussed increasing mounjaro to 7.5 mg  ?Will check A1c but as she only has 3 pens left and may need another PA, will send increased rx ?

## 2021-06-09 LAB — COMPREHENSIVE METABOLIC PANEL
ALT: 17 IU/L (ref 0–32)
AST: 14 IU/L (ref 0–40)
Albumin/Globulin Ratio: 1.9 (ref 1.2–2.2)
Albumin: 4.3 g/dL (ref 3.8–4.8)
Alkaline Phosphatase: 85 IU/L (ref 44–121)
BUN/Creatinine Ratio: 20 (ref 9–23)
BUN: 14 mg/dL (ref 6–24)
Bilirubin Total: 0.5 mg/dL (ref 0.0–1.2)
CO2: 22 mmol/L (ref 20–29)
Calcium: 9.3 mg/dL (ref 8.7–10.2)
Chloride: 102 mmol/L (ref 96–106)
Creatinine, Ser: 0.69 mg/dL (ref 0.57–1.00)
Globulin, Total: 2.3 g/dL (ref 1.5–4.5)
Glucose: 101 mg/dL — ABNORMAL HIGH (ref 70–99)
Potassium: 4.1 mmol/L (ref 3.5–5.2)
Sodium: 143 mmol/L (ref 134–144)
Total Protein: 6.6 g/dL (ref 6.0–8.5)
eGFR: 107 mL/min/{1.73_m2} (ref >59)

## 2021-06-09 LAB — LIPID PANEL
Chol/HDL Ratio: 4.1 ratio (ref 0.0–4.4)
Cholesterol, Total: 164 mg/dL (ref 100–199)
HDL: 40 mg/dL (ref >39)
LDL Chol Calc (NIH): 97 mg/dL (ref 0–99)
Triglycerides: 151 mg/dL — ABNORMAL HIGH (ref 0–149)
VLDL Cholesterol Cal: 27 mg/dL (ref 5–40)

## 2021-06-09 LAB — HEMOGLOBIN A1C
Est. average glucose Bld gHb Est-mCnc: 146 mg/dL
Hgb A1c MFr Bld: 6.7 % — ABNORMAL HIGH (ref 4.8–5.6)

## 2021-06-11 ENCOUNTER — Other Ambulatory Visit: Payer: Self-pay | Admitting: Physician Assistant

## 2021-06-11 ENCOUNTER — Other Ambulatory Visit: Payer: Self-pay

## 2021-06-11 DIAGNOSIS — G8929 Other chronic pain: Secondary | ICD-10-CM

## 2021-06-11 DIAGNOSIS — Z1211 Encounter for screening for malignant neoplasm of colon: Secondary | ICD-10-CM

## 2021-06-11 DIAGNOSIS — E1169 Type 2 diabetes mellitus with other specified complication: Secondary | ICD-10-CM

## 2021-06-11 MED ORDER — ATORVASTATIN CALCIUM 20 MG PO TABS: 20.0000 mg | ORAL_TABLET | Freq: Every day | ORAL | 3 refills | Status: DC

## 2021-06-11 MED ORDER — ONDANSETRON HCL 4 MG PO TABS: 4.0000 mg | ORAL_TABLET | Freq: Three times a day (TID) | ORAL | 0 refills | Status: DC | PRN

## 2021-06-11 MED ORDER — PEG 3350-KCL-NA BICARB-NACL 420 G PO SOLR: 4000.0000 mL | Freq: Once | ORAL | 0 refills | Status: AC

## 2021-06-11 NOTE — Progress Notes (Signed)
Gastroenterology Pre-Procedure Review ? ?Request Date: 07/02/2021 ?Requesting Physician: Dr. Vicente Males ? ? ?PATIENT REVIEW QUESTIONS: The patient responded to the following health history questions as indicated:   ? ?1. Are you having any GI issues? no ?2. Do you have a personal history of Polyps? no ?3. Do you have a family history of Colon Cancer or Polyps? no ?4. Diabetes Mellitus? yes (type 2) ?5. Joint replacements in the past 12 months?no ?6. Major health problems in the past 3 months?no ?7. Any artificial heart valves, MVP, or defibrillator?no ?   ?MEDICATIONS & ALLERGIES:    ?Patient reports the following regarding taking any anticoagulation/antiplatelet therapy:   ?Plavix, Coumadin, Eliquis, Xarelto, Lovenox, Pradaxa, Brilinta, or Effient? no ?Aspirin? yes (81 mg) ? ?Patient confirms/reports the following medications:  ?Current Outpatient Medications  ?Medication Sig Dispense Refill  ? albuterol (VENTOLIN HFA) 108 (90 Base) MCG/ACT inhaler TAKE 2 PUFFS BY MOUTH EVERY 6 HOURS AS NEEDED FOR WHEEZE OR SHORTNESS OF BREATH 8.5 each 3  ? aspirin EC 81 MG tablet Take 81 mg by mouth daily.    ? atorvastatin (LIPITOR) 10 MG tablet TAKE 1 TABLET BY MOUTH EVERY DAY 90 tablet 1  ? Blood Pressure Monitoring (BLOOD PRESSURE KIT) DEVI Check bp in am and pm 2-3 times a week 1 each 0  ? celecoxib (CELEBREX) 100 MG capsule Take 1 capsule (100 mg total) by mouth 2 (two) times daily as needed for moderate pain. 60 capsule 1  ? clobetasol ointment (TEMOVATE) 2.22 % Apply 1 application topically 2 (two) times daily. DO NOT USE ON FACE, GENITALS, OR IN SKIN FOLDS. [place] HANDS FOLLOWING USE. 60 g 0  ? cyclobenzaprine (FLEXERIL) 5 MG tablet Take 1 tablet (5 mg total) by mouth at bedtime as needed for muscle spasms. 30 tablet 0  ? ipratropium (ATROVENT) 0.03 % nasal spray Place 2 sprays into both nostrils every 12 (twelve) hours. 30 mL 0  ? JARDIANCE 25 MG TABS tablet TAKE 1 TABLET (25 MG TOTAL) BY MOUTH DAILY. 90 tablet 1  ? lisinopril  (ZESTRIL) 5 MG tablet Take 1 tablet (5 mg total) by mouth daily. 90 tablet 3  ? metFORMIN (GLUCOPHAGE) 1000 MG tablet TAKE 1 TABLET (1,000 MG TOTAL) BY MOUTH 2 (TWO) TIMES DAILY WITH A MEAL. 180 tablet 1  ? methylPREDNISolone (MEDROL DOSEPAK) 4 MG TBPK tablet Take 6 pills on day one then decrease by 1 pill each day 21 tablet 0  ? OneTouch Delica Lancets 97L MISC USE AS DIRECTED 3 TIMES A DAY 100 each 1  ? ONETOUCH VERIO test strip USE AS DIRECTED 100 strip 3  ? oxybutynin (DITROPAN) 5 MG tablet Take 1 tablet (5 mg total) by mouth 3 (three) times daily. 270 tablet 0  ? tirzepatide (MOUNJARO) 7.5 MG/0.5ML Pen Inject 7.5 mg into the skin once a week. 6 mL 1  ? ?No current facility-administered medications for this visit.  ? ? ?Patient confirms/reports the following allergies:  ?Allergies  ?Allergen Reactions  ? Levaquin [Levofloxacin In D5w] Anaphylaxis and Rash  ? Macrobid [Nitrofurantoin] Rash  ? ? ?No orders of the defined types were placed in this encounter. ? ? ?AUTHORIZATION INFORMATION ?Primary Insurance: ?1D#: ?Group #: ? ?Secondary Insurance: ?1D#: ?Group #: ? ?SCHEDULE INFORMATION: ?Date: 06/25/2021 ?Time: ?Location:[facility] ? ?

## 2021-06-19 ENCOUNTER — Other Ambulatory Visit: Payer: Self-pay | Admitting: Physician Assistant

## 2021-06-19 DIAGNOSIS — N393 Stress incontinence (female) (male): Secondary | ICD-10-CM

## 2021-06-26 LAB — HM DIABETES EYE EXAM

## 2021-06-28 ENCOUNTER — Encounter: Payer: Self-pay | Admitting: Physician Assistant

## 2021-07-02 ENCOUNTER — Other Ambulatory Visit: Payer: Self-pay

## 2021-07-02 ENCOUNTER — Encounter
Admission: RE | Disposition: A | Discharge status: Home / Self Care | Payer: Self-pay | Source: Home / Self Care | Attending: Gastroenterology

## 2021-07-02 ENCOUNTER — Ambulatory Visit: Payer: Managed Care, Other (non HMO) | Admitting: Registered Nurse

## 2021-07-02 ENCOUNTER — Encounter: Payer: Self-pay | Admitting: Gastroenterology

## 2021-07-02 ENCOUNTER — Ambulatory Visit
Admission: RE | Admit: 2021-07-02 | Discharge: 2021-07-02 | Disposition: A | Discharge status: Home / Self Care | Payer: Managed Care, Other (non HMO) | Attending: Gastroenterology | Admitting: Gastroenterology

## 2021-07-02 DIAGNOSIS — M1990 Unspecified osteoarthritis, unspecified site: Secondary | ICD-10-CM | POA: Insufficient documentation

## 2021-07-02 DIAGNOSIS — Z79899 Other long term (current) drug therapy: Secondary | ICD-10-CM | POA: Insufficient documentation

## 2021-07-02 DIAGNOSIS — E119 Type 2 diabetes mellitus without complications: Secondary | ICD-10-CM | POA: Diagnosis not present

## 2021-07-02 DIAGNOSIS — Z1211 Encounter for screening for malignant neoplasm of colon: Secondary | ICD-10-CM | POA: Diagnosis present

## 2021-07-02 DIAGNOSIS — Z7984 Long term (current) use of oral hypoglycemic drugs: Secondary | ICD-10-CM | POA: Diagnosis not present

## 2021-07-02 DIAGNOSIS — Z6841 Body Mass Index (BMI) 40.0 and over, adult: Secondary | ICD-10-CM | POA: Insufficient documentation

## 2021-07-02 DIAGNOSIS — K635 Polyp of colon: Secondary | ICD-10-CM | POA: Insufficient documentation

## 2021-07-02 DIAGNOSIS — Z87891 Personal history of nicotine dependence: Secondary | ICD-10-CM | POA: Insufficient documentation

## 2021-07-02 DIAGNOSIS — F419 Anxiety disorder, unspecified: Secondary | ICD-10-CM | POA: Diagnosis not present

## 2021-07-02 DIAGNOSIS — J45909 Unspecified asthma, uncomplicated: Secondary | ICD-10-CM | POA: Diagnosis not present

## 2021-07-02 HISTORY — PX: COLONOSCOPY WITH PROPOFOL: SHX5780

## 2021-07-02 LAB — GLUCOSE, CAPILLARY: Glucose-Capillary: 126 mg/dL — ABNORMAL HIGH (ref 70–99)

## 2021-07-02 SURGERY — COLONOSCOPY WITH PROPOFOL
Anesthesia: General

## 2021-07-02 MED ORDER — DEXMEDETOMIDINE (PRECEDEX) IN NS 20 MCG/5ML (4 MCG/ML) IV SYRINGE
PREFILLED_SYRINGE | INTRAVENOUS | Status: DC | PRN
  Administered 2021-07-02: 12 ug via INTRAVENOUS

## 2021-07-02 MED ORDER — SODIUM CHLORIDE 0.9 % IV SOLN: INTRAVENOUS | Status: DC

## 2021-07-02 MED ORDER — LIDOCAINE HCL (CARDIAC) PF 100 MG/5ML IV SOSY
PREFILLED_SYRINGE | INTRAVENOUS | Status: DC | PRN
Start: 2021-07-02 — End: 2021-07-02
  Administered 2021-07-02: 40 mg via INTRAVENOUS

## 2021-07-02 MED ORDER — PROPOFOL 500 MG/50ML IV EMUL
INTRAVENOUS | Status: DC | PRN
  Administered 2021-07-02: 150 ug/kg/min via INTRAVENOUS

## 2021-07-02 MED ORDER — PROPOFOL 500 MG/50ML IV EMUL
INTRAVENOUS | Status: AC
  Filled 2021-07-02: qty 50

## 2021-07-02 MED ORDER — PROPOFOL 10 MG/ML IV BOLUS
INTRAVENOUS | Status: DC | PRN
  Administered 2021-07-02: 100 mg via INTRAVENOUS

## 2021-07-02 NOTE — Transfer of Care (Signed)
Immediate Anesthesia Transfer of Care Note  Patient: Leslie Morris  Procedure(s) Performed: Procedure(s): COLONOSCOPY WITH PROPOFOL (N/A)  Patient Location: PACU and Endoscopy Unit  Anesthesia Type:General  Level of Consciousness: sedated  Airway & Oxygen Therapy: Patient Spontanous Breathing and Patient connected to nasal cannula oxygen  Post-op Assessment: Report given to RN and Post -op Vital signs reviewed and stable  Post vital signs: Reviewed and stable  Last Vitals:  Vitals:   07/02/21 0940 07/02/21 1028  BP: (!) 136/93 106/73  Pulse: 64 72  Resp: 16 20  Temp: (!) 35.8 C (!) 35.9 C  SpO2: 732% 25%    Complications: No apparent anesthesia complications

## 2021-07-02 NOTE — Anesthesia Postprocedure Evaluation (Signed)
Anesthesia Post Note  Patient: Leslie Morris  Procedure(s) Performed: COLONOSCOPY WITH PROPOFOL  Anesthesia Type: General Level of consciousness: awake and oriented Pain management: pain level controlled Vital Signs Assessment: post-procedure vital signs reviewed and stable Respiratory status: spontaneous breathing Cardiovascular status: stable Anesthetic complications: no   No notable events documented.   Last Vitals:  Vitals:   07/02/21 1038 07/02/21 1048  BP: 115/86 118/71  Pulse: 76 61  Resp: (!) 21 15  Temp:    SpO2: 100% 100%    Last Pain:  Vitals:   07/02/21 1048  TempSrc:   PainSc: 0-No pain                 VAN STAVEREN,Verdella Laidlaw

## 2021-07-02 NOTE — Anesthesia Preprocedure Evaluation (Signed)
Anesthesia Evaluation  Patient identified by MRN, date of birth, ID band Patient awake    Reviewed: Allergy & Precautions, NPO status , Patient's Chart, lab work & pertinent test results  Airway Mallampati: III  TM Distance: >3 FB Neck ROM: Full    Dental  (+) Teeth Intact   Pulmonary neg pulmonary ROS, asthma , former smoker,    Pulmonary exam normal  + decreased breath sounds      Cardiovascular Exercise Tolerance: Good negative cardio ROS Normal cardiovascular exam Rhythm:Regular     Neuro/Psych Anxiety negative neurological ROS  negative psych ROS   GI/Hepatic negative GI ROS, Neg liver ROS,   Endo/Other  negative endocrine ROSdiabetes, Well Controlled, Type 2Morbid obesity  Renal/GU negative Renal ROS  negative genitourinary   Musculoskeletal  (+) Arthritis ,   Abdominal (+) + obese,   Peds negative pediatric ROS (+)  Hematology negative hematology ROS (+)   Anesthesia Other Findings Past Medical History: No date: Anxiety No date: Arthritis     Comment:  left ankle, wrist, hand No date: BMI 50.0-59.9, adult (HCC) No date: Carpal tunnel syndrome on left No date: Family history of adverse reaction to anesthesia     Comment:  Dad is slow to wake up and n/v No date: History of kidney stones No date: Left ankle tendonitis No date: Medullary sponge kidney     Comment:  bilateral No date: PONV (postoperative nausea and vomiting)     Comment:  severe No date: Right knee meniscal tear No date: Type 2 diabetes mellitus ([place])     Comment:  followed by pcp--- last A1c 6.8 on 09-25-2016 No date: Urinary incontinence     Comment:  due to Medullary sponge kidney No date: Vitamin D deficiency  Past Surgical History: 2003  approx.: ABDOMINAL HYSTERECTOMY     Comment:  ovaries remained  No date: APPENDECTOMY 10/24/2016: CARPAL TUNNEL RELEASE; Left     Comment:  Procedure: CARPAL TUNNEL RELEASE;  Surgeon: Iran Planas, MD;  Location: [facility];  Service: Orthopedics;                Laterality: Left; 10/21/2018: CHONDROPLASTY; Right     Comment:  Procedure: CHONDROPLASTY;  Surgeon: Dereck Leep, MD;              Location: [facility];  Service: Orthopedics;  Laterality:               Right; 2003 approx.: COLONOSCOPY 10-05-2012    [facility]: CYSTOSCOPY MACROPLASTIQUE IMPLANT No date: HERNIA REPAIR     Comment:  umbilical 0/03/7739: KNEE ARTHROSCOPY; Right     Comment:  Procedure: ARTHROSCOPY KNEE, ;  Surgeon: Dereck Leep, MD;  Location: [facility];  Service: Orthopedics;                Laterality: Right; 12/04/2010   [facility]: LAPAROSCOPIC APPENDECTOMY     Comment:  and Umbilical Hernia Repair [phone]   [facility]: LAPAROSCOPIC CHOLECYSTECTOMY 10/21/2018: MENISECTOMY; Right     Comment:  Procedure: PARTIAL LATERAL MENISECTOMY;  Surgeon:               Dereck Leep, MD;  Location: [facility];  Service:               Orthopedics;  Laterality: Right; age 51: ORIF LEFT ANKLE COMPLEX FRACTURES  Comment:  same year hardware removed child: TONSILLECTOMY 03/21/2016: ULNAR COLLATERAL LIGAMENT REPAIR; Left     Comment:  Procedure: Left thumb ulnar collateral ligament repair;               Surgeon: Iran Planas, MD;  Location: [facility];  Service:               Orthopedics;  Laterality: Left; 2005: URETEROLITHOTOMY  BMI    Body Mass Index: 45.42 kg/m      Reproductive/Obstetrics negative OB ROS                             Anesthesia Physical Anesthesia Plan  ASA: 3  Anesthesia Plan: General   Post-op Pain Management:    Induction: Intravenous  PONV Risk Score and Plan: Propofol infusion and TIVA  Airway Management Planned: Natural Airway and Nasal Cannula  Additional Equipment:   Intra-op Plan:   Post-operative Plan:   Informed Consent: I have reviewed the patients History and Physical, chart, labs and discussed the procedure including the risks,  benefits and alternatives for the proposed anesthesia with the patient or authorized representative who has indicated his/her understanding and acceptance.     Dental Advisory Given  Plan Discussed with: CRNA and Surgeon  Anesthesia Plan Comments:         Anesthesia Quick Evaluation

## 2021-07-02 NOTE — Op Note (Signed)
[facility] Gastroenterology Patient Name: Leslie Morris Procedure Date: 07/02/2021 9:53 AM MRN: [id number] Account #: [id number] Date of Birth: [birth year] Admit Type: Outpatient Age: 51 Room: [facility] ENDO ROOM 4 Gender: Female Note Status: Finalized Instrument Name: Jasper Riling [id number] Procedure:             Colonoscopy Indications:           Screening for colorectal malignant neoplasm Providers:             Jonathon Bellows MD, MD Referring MD:          No Local Md, MD (Referring MD) Medicines:             Monitored Anesthesia Care Complications:         No immediate complications. Procedure:             Pre-Anesthesia Assessment:                        - Prior to the procedure, a History and Physical was                         performed, and patient medications, allergies and                         sensitivities were reviewed. The patient's tolerance                         of previous anesthesia was reviewed.                        - The risks and benefits of the procedure and the                         sedation options and risks were discussed with the                         patient. All questions were answered and informed                         consent was obtained.                        - ASA Grade Assessment: II - A patient with mild                         systemic disease.                        After obtaining informed consent, the colonoscope was                         passed under direct vision. Throughout the procedure,                         the patient's blood pressure, pulse, and oxygen                         saturations were monitored continuously. The                         Colonoscope was  introduced through the anus and                         advanced to the the cecum, identified by the                         appendiceal orifice. The colonoscopy was performed                         with ease. The patient tolerated the procedure well.                          The quality of the bowel preparation was excellent. Findings:      The perianal and digital rectal examinations were normal.      Six sessile polyps were found in the recto-sigmoid colon. The polyps       were 4 to 5 mm in size. These polyps were removed with a cold snare.       Resection and retrieval were complete.      The exam was otherwise without abnormality on direct and retroflexion       views. Impression:            - Six 4 to 5 mm polyps at the recto-sigmoid colon,                         removed with a cold snare. Resected and retrieved.                        - The examination was otherwise normal on direct and                         retroflexion views. Recommendation:        - Discharge patient to home (with escort).                        - Resume previous diet.                        - Continue present medications.                        - Await pathology results.                        - Repeat colonoscopy for surveillance based on                         pathology results. Procedure Code(s):     --- Professional ---                        [phone], Colonoscopy, flexible; with removal of                         tumor(s), polyp(s), or other lesion(s) by snare                         technique Diagnosis Code(s):     --- Professional ---  Z12.11, Encounter for screening for malignant neoplasm                         of colon                        K63.5, Polyp of colon CPT copyright 2019 American Medical Association. All rights reserved. The codes documented in this report are preliminary and upon coder review may  be revised to meet current compliance requirements. Jonathon Bellows, MD Jonathon Bellows MD, MD 07/02/2021 10:28:33 AM This report has been signed electronically. Number of Addenda: 0 Note Initiated On: 07/02/2021 9:53 AM Scope Withdrawal Time: 0 hours 12 minutes 29 seconds  Total Procedure Duration: 0 hours 14 minutes 10 seconds   Estimated Blood Loss:  Estimated blood loss: none.      [facility]

## 2021-07-02 NOTE — Anesthesia Procedure Notes (Signed)
Date/Time: 07/02/2021 10:08 AM Performed by: Doreen Salvage, CRNA Pre-anesthesia Checklist: Patient identified, Emergency Drugs available, Suction available and Patient being monitored Patient Re-evaluated:Patient Re-evaluated prior to induction Oxygen Delivery Method: Nasal cannula Induction Type: IV induction Dental Injury: Teeth and Oropharynx as per pre-operative assessment  Comments: Nasal cannula with etCO2 monitoring

## 2021-07-02 NOTE — H&P (Signed)
Wyline Mood, MD [address], [address], [place], [place], [address] [address], [address], [place], [place], [address] Phone: [phone]  Fax: [phone]  Primary Care Physician:  [name], PA-C   Pre-Procedure History & Physical: HPI:  Leslie Morris is a 51 y.o. female is here for an colonoscopy.   Past Medical History:  Diagnosis Date   Anxiety    Arthritis    left ankle, wrist, hand   BMI 50.0-59.9, adult (HCC)    Carpal tunnel syndrome on left    Family history of adverse reaction to anesthesia    Dad is slow to wake up and n/v   History of kidney stones    Left ankle tendonitis    Medullary sponge kidney    bilateral   PONV (postoperative nausea and vomiting)    severe   Right knee meniscal tear    Type 2 diabetes mellitus (HCC)    followed by pcp--- last A1c 6.8 on 09-25-2016   Urinary incontinence    due to Medullary sponge kidney   Vitamin D deficiency     Past Surgical History:  Procedure Laterality Date   ABDOMINAL HYSTERECTOMY  2003  approx.   ovaries remained    APPENDECTOMY     CARPAL TUNNEL RELEASE Left 10/24/2016   Procedure: CARPAL TUNNEL RELEASE;  Surgeon: Bradly Bienenstock, MD;  Location: [facility] OR;  Service: Orthopedics;  Laterality: Left;   CHONDROPLASTY Right 10/21/2018   Procedure: CHONDROPLASTY;  Surgeon: Donato Heinz, MD;  Location: [facility] ORS;  Service: Orthopedics;  Laterality: Right;   COLONOSCOPY  2003 approx.   CYSTOSCOPY MACROPLASTIQUE IMPLANT  10-05-2012    [facility]   HERNIA REPAIR     umbilical   KNEE ARTHROSCOPY Right 10/21/2018   Procedure: ARTHROSCOPY KNEE, ;  Surgeon: Donato Heinz, MD;  Location: [facility] ORS;  Service: Orthopedics;  Laterality: Right;   LAPAROSCOPIC APPENDECTOMY  12/04/2010   [facility]   and Umbilical Hernia Repair   LAPAROSCOPIC CHOLECYSTECTOMY  10-10-2009   [facility]   MENISECTOMY Right 10/21/2018   Procedure: PARTIAL LATERAL MENISECTOMY;  Surgeon: Donato Heinz, MD;  Location: [facility] ORS;  Service: Orthopedics;   Laterality: Right;   ORIF LEFT ANKLE COMPLEX FRACTURES  age 13   same year hardware removed   TONSILLECTOMY  child   ULNAR COLLATERAL LIGAMENT REPAIR Left 03/21/2016   Procedure: Left thumb ulnar collateral ligament repair;  Surgeon: Bradly Bienenstock, MD;  Location: [facility] OR;  Service: Orthopedics;  Laterality: Left;   URETEROLITHOTOMY  2005    Prior to Admission medications   Medication Sig Start Date End Date Taking? Authorizing Provider  aspirin EC 81 MG tablet Take 81 mg by mouth daily.   Yes [provider]  atorvastatin (LIPITOR) 20 MG tablet Take 1 tablet (20 mg total) by mouth daily. 06/11/21  Yes Drubel, Lillia Abed, PA-C  cyclobenzaprine (FLEXERIL) 5 MG tablet Take 1 tablet (5 mg total) by mouth at bedtime as needed for muscle spasms. 05/31/21  Yes Drubel, Lillia Abed, PA-C  ipratropium (ATROVENT) 0.03 % nasal spray Place 2 sprays into both nostrils every 12 (twelve) hours. 01/30/21  Yes Burnette, Jennifer M, PA-C  JARDIANCE 25 MG TABS tablet TAKE 1 TABLET (25 MG TOTAL) BY MOUTH DAILY. 03/29/21  Yes Drubel, Lillia Abed, PA-C  lisinopril (ZESTRIL) 5 MG tablet Take 1 tablet (5 mg total) by mouth daily. 11/30/20  Yes [name], PA-C  metFORMIN (GLUCOPHAGE) 1000 MG tablet TAKE 1 TABLET (1,000 MG TOTAL) BY MOUTH 2 (  TWO) TIMES DAILY WITH A MEAL. 03/29/21  Yes [name], PA-C  oxybutynin (DITROPAN) 5 MG tablet TAKE 1 TABLET BY MOUTH THREE TIMES A DAY 06/19/21  Yes Drubel, Ria Comment, PA-C  albuterol (VENTOLIN HFA) 108 (90 Base) MCG/ACT inhaler TAKE 2 PUFFS BY MOUTH EVERY 6 HOURS AS NEEDED FOR WHEEZE OR SHORTNESS OF BREATH Patient not taking: Reported on 07/02/2021 03/24/20   Mar Daring, PA-C  Blood Pressure Monitoring (BLOOD PRESSURE KIT) DEVI Check bp in am and pm 2-3 times a week 03/09/21   [name], PA-C  celecoxib (CELEBREX) 100 MG capsule TAKE 1 CAPSULE (100 MG TOTAL) BY MOUTH 2 (TWO) TIMES DAILY AS NEEDED FOR MODERATE PAIN. 06/11/21   [name], PA-C  clobetasol ointment  (TEMOVATE) 1.61 % Apply 1 application topically 2 (two) times daily. DO NOT USE ON FACE, GENITALS, OR IN SKIN FOLDS. [facility] HANDS FOLLOWING USE. 11/14/20   Gwyneth Sprout, FNP  methylPREDNISolone (MEDROL DOSEPAK) 4 MG TBPK tablet Take 6 pills on day one then decrease by 1 pill each day Patient not taking: Reported on 07/02/2021 05/31/21   Drubel, Ria Comment, PA-C  ondansetron (ZOFRAN) 4 MG tablet Take 1 tablet (4 mg total) by mouth every 8 (eight) hours as needed for nausea or vomiting. 06/11/21   Jonathon Bellows, MD  OneTouch Delica Lancets 09U MISC USE AS DIRECTED 3 TIMES A DAY 07/08/18   Virginia Crews, MD  [facility] VERIO test strip USE AS DIRECTED 12/10/20   Drubel, Ria Comment, PA-C  tirzepatide [facility]) 7.5 MG/0.5ML Pen Inject 7.5 mg into the skin once a week. 06/08/21   [name], PA-C    Allergies as of 06/11/2021 - Review Complete 06/11/2021  Allergen Reaction Noted   Levaquin [levofloxacin in d5w] Anaphylaxis and Rash 02/10/2017   Macrobid [nitrofurantoin] Rash 11/14/2020    Family History  Problem Relation Age of Onset   Endometriosis Mother    Diabetes Father    Heart attack Father    Cancer Maternal Aunt        cervical cancer   Cancer Paternal Aunt        breast cancer   Heart disease Neg Hx    Hyperlipidemia Neg Hx    Hypertension Neg Hx    Stroke Neg Hx     Social History   Socioeconomic History   Marital status: Single    Spouse name: Not on file   Number of children: 2   Years of education: 14   Highest education level: Not on file  Occupational History   Occupation: [employer]  Tobacco Use   Smoking status: Former    Packs/day: 0.50    Years: 15.00    Pack years: 7.50    Types: Cigarettes    Quit date: 06/13/2021    Years since quitting: 0.0   Smokeless tobacco: Never  Vaping Use   Vaping Use: Never used  Substance and Sexual Activity   Alcohol use: No    Alcohol/week: 0.0 standard drinks   Drug use: No   Sexual activity: Not on file  Other  Topics Concern   Not on file  Social History Narrative   Not on file   Social Determinants of Health   Financial Resource Strain: Not on file  Food Insecurity: Not on file  Transportation Needs: Not on file  Physical Activity: Not on file  Stress: Not on file  Social Connections: Not on file  Intimate Partner Violence: Not on file    Review of Systems: See  HPI, otherwise negative ROS  Physical Exam: BP (!) 136/93   Pulse 64   Temp (!) 96.5 F (35.8 C) (Temporal)   Resp 16   Ht $R'5\' 7"'ml$  (1.702 m)   Wt 131.5 kg   LMP 11/30/1998 (Approximate) Comment: Hysterectomy at age 7 yo  SpO2 100%   BMI 45.42 kg/m  General:   Alert,  pleasant and cooperative in NAD Head:  Normocephalic and atraumatic. Neck:  Supple; no masses or thyromegaly. Lungs:  Clear throughout to auscultation, normal respiratory effort.    Heart:  +S1, +S2, Regular rate and rhythm, No edema. Abdomen:  Soft, nontender and nondistended. Normal bowel sounds, without guarding, and without rebound.   Neurologic:  Alert and  oriented x4;  grossly normal neurologically.  Impression/Plan: Leslie Morris is here for an colonoscopy to be performed for Screening colonoscopy average risk   Risks, benefits, limitations, and alternatives regarding  colonoscopy have been reviewed with the patient.  Questions have been answered.  All parties agreeable.   Jonathon Bellows, MD  07/02/2021, 9:58 AM

## 2021-07-03 ENCOUNTER — Encounter: Payer: Self-pay | Admitting: Gastroenterology

## 2021-07-03 LAB — SURGICAL PATHOLOGY

## 2021-07-03 NOTE — Progress Notes (Signed)
Leslie Morris

## 2021-07-17 ENCOUNTER — Encounter (INDEPENDENT_AMBULATORY_CARE_PROVIDER_SITE_OTHER): Payer: Managed Care, Other (non HMO) | Admitting: Physician Assistant

## 2021-07-17 ENCOUNTER — Other Ambulatory Visit: Payer: Self-pay | Admitting: Physician Assistant

## 2021-07-17 DIAGNOSIS — R110 Nausea: Secondary | ICD-10-CM

## 2021-07-17 DIAGNOSIS — E1165 Type 2 diabetes mellitus with hyperglycemia: Secondary | ICD-10-CM

## 2021-07-17 MED ORDER — ONDANSETRON HCL 4 MG PO TABS: 4.0000 mg | ORAL_TABLET | Freq: Three times a day (TID) | ORAL | 0 refills | Status: DC | PRN

## 2021-07-17 MED ORDER — PROMETHAZINE HCL 12.5 MG PO TABS: 12.5000 mg | ORAL_TABLET | Freq: Three times a day (TID) | ORAL | 0 refills | Status: DC | PRN

## 2021-07-17 NOTE — Telephone Encounter (Signed)

## 2021-08-11 ENCOUNTER — Other Ambulatory Visit: Payer: Self-pay | Admitting: Physician Assistant

## 2021-08-11 DIAGNOSIS — G8929 Other chronic pain: Secondary | ICD-10-CM

## 2021-09-01 ENCOUNTER — Other Ambulatory Visit: Payer: Self-pay | Admitting: Physician Assistant

## 2021-09-01 DIAGNOSIS — E1165 Type 2 diabetes mellitus with hyperglycemia: Secondary | ICD-10-CM

## 2021-09-01 DIAGNOSIS — R300 Dysuria: Secondary | ICD-10-CM

## 2021-09-07 ENCOUNTER — Ambulatory Visit (INDEPENDENT_AMBULATORY_CARE_PROVIDER_SITE_OTHER): Payer: Managed Care, Other (non HMO) | Admitting: Physician Assistant

## 2021-09-07 ENCOUNTER — Encounter: Payer: Self-pay | Admitting: Physician Assistant

## 2021-09-07 VITALS — BP 124/79 | HR 66 | Ht 67.0 in | Wt 293.0 lb

## 2021-09-07 DIAGNOSIS — R112 Nausea with vomiting, unspecified: Secondary | ICD-10-CM | POA: Diagnosis not present

## 2021-09-07 DIAGNOSIS — F4323 Adjustment disorder with mixed anxiety and depressed mood: Secondary | ICD-10-CM | POA: Diagnosis not present

## 2021-09-07 DIAGNOSIS — E119 Type 2 diabetes mellitus without complications: Secondary | ICD-10-CM | POA: Diagnosis not present

## 2021-09-07 DIAGNOSIS — R110 Nausea: Secondary | ICD-10-CM | POA: Insufficient documentation

## 2021-09-07 NOTE — Progress Notes (Signed)
Established Patient Office Visit  Subjective   Patient ID: Leslie Morris, female    DOB: [birth year]  Age: 51 y.o. MRN: [id number]  Chief Complaint  Patient presents with   follow up dm    HPI  51 y.o. female for follow up of diabetes.   Current Outpatient Medications  Medication Sig Dispense Refill   aspirin EC 81 MG tablet Take 81 mg by mouth daily.     atorvastatin (LIPITOR) 20 MG tablet Take 1 tablet (20 mg total) by mouth daily. 90 tablet 3   Blood Pressure Monitoring (BLOOD PRESSURE KIT) DEVI Check bp in am and pm 2-3 times a week 1 each 0   celecoxib (CELEBREX) 100 MG capsule TAKE 1 CAPSULE (100 MG TOTAL) BY MOUTH 2 (TWO) TIMES DAILY AS NEEDED FOR MODERATE PAIN. 60 capsule 1   cyclobenzaprine (FLEXERIL) 5 MG tablet Take 1 tablet (5 mg total) by mouth at bedtime as needed for muscle spasms. 30 tablet 0   ipratropium (ATROVENT) 0.03 % nasal spray Place 2 sprays into both nostrils every 12 (twelve) hours. 30 mL 0   JARDIANCE 25 MG TABS tablet TAKE 1 TABLET (25 MG TOTAL) BY MOUTH DAILY. 90 tablet 1   lisinopril (ZESTRIL) 5 MG tablet Take 1 tablet (5 mg total) by mouth daily. 90 tablet 3   metFORMIN (GLUCOPHAGE) 1000 MG tablet TAKE 1 TABLET (1,000 MG TOTAL) BY MOUTH TWICE A DAY WITH FOOD 180 tablet 1   OneTouch Delica Lancets 78G MISC USE AS DIRECTED 3 TIMES A DAY 100 each 1   ONETOUCH VERIO test strip USE AS DIRECTED 100 strip 3   oxybutynin (DITROPAN) 5 MG tablet TAKE 1 TABLET BY MOUTH THREE TIMES A DAY 270 tablet 1   promethazine (PHENERGAN) 12.5 MG tablet Take 1 tablet (12.5 mg total) by mouth every 8 (eight) hours as needed for nausea or vomiting. 30 tablet 0   tirzepatide (MOUNJARO) 7.5 MG/0.5ML Pen Inject 7.5 mg into the skin once a week. 6 mL 1   No current facility-administered medications for this visit.   Reports significant nausea and vomiting for 2-4 weeks when she first increased her Mounjaro dose to 7.5 mg. This has resolved, last 3 weeks without any nausea or  vomiting.  Fasting this Am 110. Denies lows, lowest she was was 98.   Reports increase in anxiety/depression symptoms since learning some news about her daughter. Affecting her stress eating, not sleeping well.   Review of Systems  Constitutional:  Negative for malaise/fatigue.  Respiratory:  Negative for cough.   Cardiovascular:  Negative for chest pain and leg swelling.  Gastrointestinal:  Positive for nausea and vomiting.  Neurological:  Negative for headaches.      Objective:     BP 124/79   Pulse 66   Ht $R'5\' 7"'WR$  (1.702 m)   Wt 293 lb (132.9 kg)   LMP 11/30/1998 (Approximate) Comment: Hysterectomy at age 89 yo  SpO2 96%   BMI 45.89 kg/m   Physical Exam Vitals reviewed.  Constitutional:      Appearance: She is not ill-appearing.  HENT:     Head: Normocephalic.  Eyes:     Conjunctiva/sclera: Conjunctivae normal.  Cardiovascular:     Rate and Rhythm: Normal rate.  Pulmonary:     Effort: Pulmonary effort is normal. No respiratory distress.  Neurological:     General: No focal deficit present.     Mental Status: She is alert and oriented to person, place, and time.  Psychiatric:  Mood and Affect: Mood normal.        Behavior: Behavior normal.       Assessment & Plan:   Problem List Items Addressed This Visit       Digestive   Nausea and vomiting    D/t extreme n/v a few weeks ago, will check cmp, vit b12-- to ensure we are absorbing on metformin and mounjaro      Relevant Orders   Comprehensive Metabolic Panel (CMET)   Vitamin B12     Endocrine   Well controlled type 2 diabetes mellitus without evidence of diabetic end organ manifestation ([place]) - Primary    Managing with jardiance 25 mg, metformin 1000 mg bid, mounjaro 7.5 mg On statin, on ace-I Foot exam utd, ordered uacr today Advised if we are now < 6% would lower metformin dose 3/22 was 336 lbs, today 293.  F/u 3 mo      Relevant Orders   HgB A1c   Urine Microalbumin w/creat. ratio    Comprehensive Metabolic Panel (CMET)     Other   Adjustment disorder with mixed anxiety and depressed mood    Really stressed therapy. Pt is managing and we discuss therapy related things, but would benefit greatly from a consistent more frequent relationship with a therapist       Return in about 3 months (around 12/08/2021) for DMII, anxiety.  I, [name], PA-C have reviewed all documentation for this visit. The documentation on 09/07/2021 for the exam, diagnosis, procedures, and orders are all accurate and complete.  [name], PA-C [facility] [address] [place], [place], [address] Office: [phone] Fax: [phone]

## 2021-09-07 NOTE — Assessment & Plan Note (Signed)
D/t extreme n/v a few weeks ago, will check cmp, vit b12-- to ensure we are absorbing on metformin and mounjaro

## 2021-09-07 NOTE — Patient Instructions (Addendum)
[email or web address]

## 2021-09-07 NOTE — Assessment & Plan Note (Addendum)
Managing with jardiance 25 mg, metformin 1000 mg bid, mounjaro 7.5 mg On statin, on ace-I Foot exam utd, ordered uacr today Advised if we are now < 6% would lower metformin dose 3/22 was 336 lbs, today 293.  F/u 3 mo

## 2021-09-07 NOTE — Assessment & Plan Note (Signed)
Really stressed therapy. Pt is managing and we discuss therapy related things, but would benefit greatly from a consistent more frequent relationship with a therapist

## 2021-09-07 NOTE — Progress Notes (Deleted)
Established patient visit   Patient: Leslie Morris   DOB: February 19, 1970   51 y.o. Female  MRN: [id number] Visit Date: 09/07/2021  Today's healthcare provider: [name], PA-C   Chief Complaint  Patient presents with   follow up dm   Subjective    HPI  Diabetes Mellitus Type II, Follow-up  Lab Results  Component Value Date   HGBA1C 6.7 (H) 06/08/2021   HGBA1C 6.7 (A) 03/09/2021   HGBA1C 8.0 (A) 11/30/2020   Wt Readings from Last 3 Encounters:  09/07/21 293 lb (132.9 kg)  07/02/21 290 lb (131.5 kg)  06/08/21 298 lb 9.6 oz (135.4 kg)   Last seen for diabetes 3 months ago.  Management since then includes  discussed increasing mounjaro to 7.5 mg . She reports {excellent/good/fair/poor:19665} compliance with treatment. She {is/is not:21021397} having side effects. {document side effects if present:1} Symptoms: {Yes/No:20286} fatigue {Yes/No:20286} foot ulcerations  {Yes/No:20286} appetite changes {Yes/No:20286} nausea  {Yes/No:20286} paresthesia of the feet  {Yes/No:20286} polydipsia  {Yes/No:20286} polyuria {Yes/No:20286} visual disturbances   {Yes/No:20286} vomiting     Home blood sugar records: {diabetes glucometry results:16657}  Episodes of hypoglycemia? {Yes/No:20286} {enter symptoms and frequency of symptoms if yes:1}   Current insulin regiment: {enter 'none' or type of insulin and number of units taken with each dose of each insulin formulation that the patient is taking:1} Most Recent Eye Exam: *** {Current exercise:16438:::1} {Current diet habits:16563:::1}  Pertinent Labs: Lab Results  Component Value Date   CHOL 164 06/08/2021   HDL 40 06/08/2021   LDLCALC 97 06/08/2021   TRIG 151 (H) 06/08/2021   CHOLHDL 4.1 06/08/2021   Lab Results  Component Value Date   NA 143 06/08/2021   K 4.1 06/08/2021   CREATININE 0.69 06/08/2021   EGFR 107 06/08/2021   MICROALBUR 50 11/30/2020   LABMICR See below: 11/06/2020      ---------------------------------------------------------------------------------------------------   Medications: Outpatient Medications Prior to Visit  Medication Sig   aspirin EC 81 MG tablet Take 81 mg by mouth daily.   atorvastatin (LIPITOR) 20 MG tablet Take 1 tablet (20 mg total) by mouth daily.   Blood Pressure Monitoring (BLOOD PRESSURE KIT) DEVI Check bp in am and pm 2-3 times a week   celecoxib (CELEBREX) 100 MG capsule TAKE 1 CAPSULE (100 MG TOTAL) BY MOUTH 2 (TWO) TIMES DAILY AS NEEDED FOR MODERATE PAIN.   cyclobenzaprine (FLEXERIL) 5 MG tablet Take 1 tablet (5 mg total) by mouth at bedtime as needed for muscle spasms.   ipratropium (ATROVENT) 0.03 % nasal spray Place 2 sprays into both nostrils every 12 (twelve) hours.   JARDIANCE 25 MG TABS tablet TAKE 1 TABLET (25 MG TOTAL) BY MOUTH DAILY.   lisinopril (ZESTRIL) 5 MG tablet Take 1 tablet (5 mg total) by mouth daily.   metFORMIN (GLUCOPHAGE) 1000 MG tablet TAKE 1 TABLET (1,000 MG TOTAL) BY MOUTH TWICE A DAY WITH FOOD   OneTouch Delica Lancets 62I MISC USE AS DIRECTED 3 TIMES A DAY   ONETOUCH VERIO test strip USE AS DIRECTED   oxybutynin (DITROPAN) 5 MG tablet TAKE 1 TABLET BY MOUTH THREE TIMES A DAY   promethazine (PHENERGAN) 12.5 MG tablet Take 1 tablet (12.5 mg total) by mouth every 8 (eight) hours as needed for nausea or vomiting.   tirzepatide (MOUNJARO) 7.5 MG/0.5ML Pen Inject 7.5 mg into the skin once a week.   [DISCONTINUED] clobetasol ointment (TEMOVATE) 9.48 % Apply 1 application topically 2 (two) times daily. DO NOT USE  ON FACE, GENITALS, OR IN SKIN FOLDS. [place] HANDS FOLLOWING USE.   [DISCONTINUED] methylPREDNISolone (MEDROL DOSEPAK) 4 MG TBPK tablet Take 6 pills on day one then decrease by 1 pill each day   [DISCONTINUED] albuterol (VENTOLIN HFA) 108 (90 Base) MCG/ACT inhaler TAKE 2 PUFFS BY MOUTH EVERY 6 HOURS AS NEEDED FOR WHEEZE OR SHORTNESS OF BREATH (Patient not taking: Reported on 07/02/2021)   No  facility-administered medications prior to visit.    Review of Systems  {Labs  Heme  Chem  Endocrine  Serology  Results Review (optional):23779}   Objective    BP 124/79   Pulse 66   Ht $R'5\' 7"'Lo$  (1.702 m)   Wt 293 lb (132.9 kg)   LMP 11/30/1998 (Approximate) Comment: Hysterectomy at age 24 yo  SpO2 96%   BMI 45.89 kg/m  {Show previous vital signs (optional):23777}  Physical Exam  ***  No results found for any visits on 09/07/21.  Assessment & Plan     ***  No follow-ups on file.      {provider attestation***:1}   [name], PA-C  [facility] [phone] (phone) [phone] (fax)  [place]

## 2021-09-08 LAB — COMPREHENSIVE METABOLIC PANEL
ALT: 21 IU/L (ref 0–32)
AST: 27 IU/L (ref 0–40)
Albumin/Globulin Ratio: 1.6 (ref 1.2–2.2)
Albumin: 4.4 g/dL (ref 3.9–4.9)
Alkaline Phosphatase: 91 IU/L (ref 44–121)
BUN/Creatinine Ratio: 17 (ref 9–23)
BUN: 13 mg/dL (ref 6–24)
Bilirubin Total: 0.6 mg/dL (ref 0.0–1.2)
CO2: 21 mmol/L (ref 20–29)
Calcium: 9.4 mg/dL (ref 8.7–10.2)
Chloride: 100 mmol/L (ref 96–106)
Creatinine, Ser: 0.77 mg/dL (ref 0.57–1.00)
Globulin, Total: 2.7 g/dL (ref 1.5–4.5)
Glucose: 90 mg/dL (ref 70–99)
Potassium: 4.1 mmol/L (ref 3.5–5.2)
Sodium: 139 mmol/L (ref 134–144)
Total Protein: 7.1 g/dL (ref 6.0–8.5)
eGFR: 95 mL/min/{1.73_m2} (ref >59)

## 2021-09-08 LAB — HEMOGLOBIN A1C
Est. average glucose Bld gHb Est-mCnc: 143 mg/dL
Hgb A1c MFr Bld: 6.6 % — ABNORMAL HIGH (ref 4.8–5.6)

## 2021-09-08 LAB — MICROALBUMIN / CREATININE URINE RATIO
Creatinine, Urine: 180.7 mg/dL
Microalb/Creat Ratio: 30 mg/g creat — ABNORMAL HIGH (ref 0–29)
Microalbumin, Urine: 54.9 ug/mL

## 2021-09-08 LAB — VITAMIN B12: Vitamin B-12: 250 pg/mL (ref 232–1245)

## 2021-10-11 ENCOUNTER — Other Ambulatory Visit: Payer: Self-pay | Admitting: Physician Assistant

## 2021-10-11 DIAGNOSIS — G8929 Other chronic pain: Secondary | ICD-10-CM

## 2021-11-21 ENCOUNTER — Other Ambulatory Visit: Payer: Self-pay | Admitting: Physician Assistant

## 2021-11-21 DIAGNOSIS — E1165 Type 2 diabetes mellitus with hyperglycemia: Secondary | ICD-10-CM

## 2021-11-21 DIAGNOSIS — R809 Proteinuria, unspecified: Secondary | ICD-10-CM

## 2021-11-28 ENCOUNTER — Other Ambulatory Visit: Payer: Self-pay | Admitting: Physician Assistant

## 2021-11-28 DIAGNOSIS — E1165 Type 2 diabetes mellitus with hyperglycemia: Secondary | ICD-10-CM

## 2021-11-28 NOTE — Telephone Encounter (Signed)
Requested medication (s) are due for refill today: yes  Requested medication (s) are on the active medication list: yes  Last refill:  06/08/21  Future visit scheduled: yes  Notes to clinic:  Unable to refill per protocol, Medication not assigned to a protocol, review manually.       Requested Prescriptions  Pending Prescriptions Disp Refills   MOUNJARO 7.5 MG/0.5ML Pen [Pharmacy Med Name: MOUNJARO 7.5 MG/0.5 ML PEN]  5    Sig: INJECT 7.5 MG SUBCUTANEOUSLY WEEKLY     Off-Protocol Failed - 11/28/2021  1:41 AM      Failed - Medication not assigned to a protocol, review manually.      Passed - Valid encounter within last 12 months    Recent Outpatient Visits           2 months ago Well controlled type 2 diabetes mellitus without evidence of diabetic end organ manifestation [facility])   [facility] [name], PA-C   5 months ago Type 2 diabetes mellitus with hyperglycemia, without long-term current use of insulin [facility])   [facility] Thed[facility] Mins, [place], PA-C   6 months ago Strain of lumbar region, initial encounter   [employer], [place], PA-C   8 months ago Type 2 diabetes mellitus with hyperglycemia, without long-term current use of insulin [facility])   [facility] [name], PA-C   12 months ago Encounter for physical examination   [facility] [name], PA-C       Future Appointments             In 6 days [name], PA-C [facility] [place]   In 2 weeks [name], PA-C [employer]

## 2021-12-03 NOTE — Progress Notes (Signed)
I,[name],acting as a Education administrator for [employer], PA-C.,have documented all relevant documentation on the behalf of [name], PA-C,as directed by  [name], PA-C while in the presence of [name], PA-C.   Complete physical exam   Patient: Leslie Morris   DOB: Dec 08, 1970   51 y.o. Female  MRN: [id number] Visit Date: 12/04/2021  Today's healthcare provider: [name], PA-C   Chief Complaint  Patient presents with   Annual Exam   Subjective    Leslie Morris is a 51 y.o. female who presents today for a complete physical exam.  She reports consuming a general diet. The patient does not participate in regular exercise at present. She generally feels fairly well. She reports sleeping fairly well. She does have additional problems to discuss today.   Increased feelings of apathy, sadness, crying, trouble sleeping, eating more. Pt's daughter recently had a baby and there is traumatic history in that family dynamic.  Pt has spoken to a behavioral health coworker at work and she suggested to see a psychologist/psychiatrist as there could be some history of PTSD.  HPI     12/04/2021    1:53 PM 09/07/2021   10:31 AM 03/09/2021   10:56 AM  PHQ9 SCORE ONLY  PHQ-9 Total Score _0 Past Medical History:  Diagnosis Date   Anxiety    Arthritis    left ankle, wrist, hand   BMI 50.0-59.9, adult (HCC)    Carpal tunnel syndrome on left    Family history of adverse reaction to anesthesia    Dad is slow to wake up and n/v   History of kidney stones    Left ankle tendonitis    Medullary sponge kidney    bilateral   PONV (postoperative nausea and vomiting)    severe   Right knee meniscal tear    Type 2 diabetes mellitus ([place])    followed by pcp--- last A1c 6.8 on 09-25-2016   Urinary incontinence    due to Medullary sponge kidney   Vitamin D deficiency    Past Surgical History:  Procedure Laterality Date   ABDOMINAL HYSTERECTOMY  2003  approx.    ovaries remained    APPENDECTOMY     CARPAL TUNNEL RELEASE Left 10/24/2016   Procedure: CARPAL TUNNEL RELEASE;  Surgeon: Iran Planas, MD;  Location: [facility];  Service: Orthopedics;  Laterality: Left;   CHONDROPLASTY Right 10/21/2018   Procedure: CHONDROPLASTY;  Surgeon: Dereck Leep, MD;  Location: [facility];  Service: Orthopedics;  Laterality: Right;   COLONOSCOPY  2003 approx.   COLONOSCOPY WITH PROPOFOL N/A 07/02/2021   Procedure: COLONOSCOPY WITH PROPOFOL;  Surgeon: Jonathon Bellows, MD;  Location[facility];  Service: Gastroenterology;  Laterality: N/A;   CYSTOSCOPY MACROPLASTIQUE IMPLANT  10-05-2012    [facility]   HERNIA REPAIR     umbilical   KNEE ARTHROSCOPY Right 10/21/2018   Procedure: ARTHROSCOPY KNEE, ;  Surgeon: Dereck Leep, MD;  Location: [facility];  Service: Orthopedics;  Laterality: Right;   LAPAROSCOPIC APPENDECTOMY  12/04/2010   [facility]   and Umbilical Hernia Repair   LAPAROSCOPIC CHOLECYSTECTOMY  10-10-2009   [facility]   MENISECTOMY Right 10/21/2018   Procedure: PARTIAL LATERAL MENISECTOMY;  Surgeon: Dereck Leep, MD;  Location: [facility];  Service: Orthopedics;  Laterality: Right;   ORIF LEFT ANKLE COMPLEX FRACTURES  age 76   same year hardware removed   TONSILLECTOMY  child   ULNAR COLLATERAL LIGAMENT REPAIR Left  03/21/2016   Procedure: Left thumb ulnar collateral ligament repair;  Surgeon: Iran Planas, MD;  Location: [facility];  Service: Orthopedics;  Laterality: Left;   URETEROLITHOTOMY  2005   Social History   Socioeconomic History   Marital status: Single    Spouse name: Not on file   Number of children: 2   Years of education: 14   Highest education level: Not on file  Occupational History   Occupation: [employer]  Tobacco Use   Smoking status: Former    Packs/day: 0.50    Years: 15.00    Total pack years: 7.50    Types: Cigarettes    Quit date: 06/13/2021    Years since quitting: 0.4   Smokeless tobacco: Never  Vaping Use   Vaping Use: Never used  Substance  and Sexual Activity   Alcohol use: No    Alcohol/week: 0.0 standard drinks of alcohol   Drug use: No   Sexual activity: Not on file  Other Topics Concern   Not on file  Social History Narrative   Not on file   Social Determinants of Health   Financial Resource Strain: Not on file  Food Insecurity: Not on file  Transportation Needs: Not on file  Physical Activity: Not on file  Stress: Not on file  Social Connections: Not on file  Intimate Partner Violence: Not on file   Family Status  Relation Name Status   Mother  Alive   Father  Alive   Mat Aunt  (Not Specified)   Field seismologist  (Not Specified)   Neg Hx  (Not Specified)   Family History  Problem Relation Age of Onset   Endometriosis Mother    Diabetes Father    Heart attack Father    Cancer Maternal Aunt        cervical cancer   Cancer Paternal Aunt        breast cancer   Heart disease Neg Hx    Hyperlipidemia Neg Hx    Hypertension Neg Hx    Stroke Neg Hx    Allergies  Allergen Reactions   Levaquin [Levofloxacin In D5w] Anaphylaxis and Rash   Macrobid [Nitrofurantoin] Rash    Patient Care Team: [name], PA-C as PCP - General (Physician Assistant) Pa, [place] (Optometry)   Medications: Outpatient Medications Prior to Visit  Medication Sig   aspirin EC 81 MG tablet Take 81 mg by mouth daily.   atorvastatin (LIPITOR) 20 MG tablet Take 1 tablet (20 mg total) by mouth daily.   Blood Pressure Monitoring (BLOOD PRESSURE KIT) DEVI Check bp in am and pm 2-3 times a week   celecoxib (CELEBREX) 100 MG capsule TAKE 1 CAPSULE (100 MG TOTAL) BY MOUTH 2 (TWO) TIMES DAILY AS NEEDED FOR MODERATE PAIN.   cyclobenzaprine (FLEXERIL) 5 MG tablet Take 1 tablet (5 mg total) by mouth at bedtime as needed for muscle spasms.   ipratropium (ATROVENT) 0.03 % nasal spray Place 2 sprays into both nostrils every 12 (twelve) hours.   JARDIANCE 25 MG TABS tablet TAKE 1 TABLET (25 MG TOTAL) BY MOUTH DAILY.   lisinopril  (ZESTRIL) 5 MG tablet TAKE 1 TABLET (5 MG TOTAL) BY MOUTH DAILY.   metFORMIN (GLUCOPHAGE) 1000 MG tablet TAKE 1 TABLET (1,000 MG TOTAL) BY MOUTH TWICE A DAY WITH FOOD   MOUNJARO 7.5 MG/0.5ML Pen INJECT 7.5 MG SUBCUTANEOUSLY WEEKLY   OneTouch Delica Lancets 85U MISC USE AS DIRECTED 3 TIMES A DAY   ONETOUCH VERIO test  strip USE AS DIRECTED   oxybutynin (DITROPAN) 5 MG tablet TAKE 1 TABLET BY MOUTH THREE TIMES A DAY   promethazine (PHENERGAN) 12.5 MG tablet Take 1 tablet (12.5 mg total) by mouth every 8 (eight) hours as needed for nausea or vomiting.   No facility-administered medications prior to visit.    Review of Systems  Constitutional:  Negative for chills, fatigue and fever.  HENT:  Positive for hearing loss. Negative for congestion, ear pain, rhinorrhea, sneezing and sore throat.   Eyes:  Positive for visual disturbance. Negative for pain and redness.  Respiratory:  Negative for cough, shortness of breath and wheezing.   Cardiovascular:  Negative for chest pain and leg swelling.  Gastrointestinal:  Positive for constipation. Negative for abdominal pain, blood in stool, diarrhea and nausea.  Endocrine: Negative for polydipsia and polyphagia.  Genitourinary: Negative.  Negative for dysuria, flank pain, hematuria, pelvic pain, vaginal bleeding and vaginal discharge.  Musculoskeletal:  Positive for arthralgias and joint swelling. Negative for back pain and gait problem.  Skin:  Negative for rash.  Neurological: Negative.  Negative for dizziness, tremors, seizures, weakness, light-headedness, numbness and headaches.  Hematological:  Negative for adenopathy.  Psychiatric/Behavioral: Negative.  Negative for behavioral problems, confusion and dysphoric mood. The patient is not nervous/anxious and is not hyperactive.     Objective    BP 126/75   Pulse 75   Temp 98.6 F (37 C) (Oral)   Ht _0  (1.702 m)   Wt 298 lb (135.2 kg)   LMP 11/30/1998 (Approximate) Comment: Hysterectomy at age  50 yo  SpO2 99%   BMI 46.67 kg/m   Physical Exam Constitutional:      General: She is awake.     Appearance: She is well-developed. She is not ill-appearing.  HENT:     Head: Normocephalic.     Right Ear: Tympanic membrane normal.     Left Ear: Tympanic membrane normal.     Nose: Nose normal. No congestion or rhinorrhea.     Mouth/Throat:     Pharynx: No oropharyngeal exudate or posterior oropharyngeal erythema.  Eyes:     Conjunctiva/sclera: Conjunctivae normal.     Pupils: Pupils are equal, round, and reactive to light.  Neck:     Thyroid: No thyroid mass or thyromegaly.  Cardiovascular:     Rate and Rhythm: Normal rate and regular rhythm.     Pulses:          Dorsalis pedis pulses are 3+ on the right side and 3+ on the left side.       Posterior tibial pulses are 3+ on the right side and 3+ on the left side.     Heart sounds: Normal heart sounds.  Pulmonary:     Effort: Pulmonary effort is normal.     Breath sounds: Normal breath sounds.  Abdominal:     Palpations: Abdomen is soft.     Tenderness: There is no abdominal tenderness.  Musculoskeletal:     Right lower leg: No swelling. No edema.     Left lower leg: No swelling. No edema.  Feet:     Right foot:     Protective Sensation: 4 sites tested.  4 sites sensed.     Skin integrity: Skin integrity normal.     Toenail Condition: Right toenails are normal.     Left foot:     Protective Sensation: 4 sites tested.  4 sites sensed.     Skin integrity: Skin integrity normal.  Toenail Condition: Left toenails are normal.  Lymphadenopathy:     Cervical: No cervical adenopathy.  Skin:    General: Skin is warm.  Neurological:     Mental Status: She is alert and oriented to person, place, and time.  Psychiatric:        Attention and Perception: Attention normal.        Mood and Affect: Mood normal.        Speech: Speech normal.        Behavior: Behavior normal. Behavior is cooperative.     Last depression  screening scores    12/04/2021    1:53 PM 09/07/2021   10:31 AM 03/09/2021   10:56 AM  PHQ 2/9 Scores  PHQ - 2 Score 2 0 0  PHQ- 9 Score _0 Last fall risk screening    09/07/2021   10:32 AM  Fall Risk   Falls in the past year? 0  Number falls in past yr: 0  Injury with Fall? 0  Risk for fall due to : No Fall Risks  Follow up Falls evaluation completed   Last Audit-C alcohol use screening    09/07/2021   10:32 AM  Alcohol Use Disorder Test (AUDIT)  1. How often do you have a drink containing alcohol? 0  3. How often do you have six or more drinks on one occasion? 0   A score of 3 or more in women, and 4 or more in men indicates increased risk for alcohol abuse, EXCEPT if all of the points are from question 1   No results found for any visits on 12/04/21.  Assessment & Plan    Routine Health Maintenance and Physical Exam  Exercise Activities and Dietary recommendations --balanced diet high in fiber and protein, low in sugars, carbs, fats. --physical activity/exercise 30 minutes 3-5 times a week     Immunization History  Administered Date(s) Administered   Hepatitis B 10/13/2007, 12/01/2007, 03/15/2015   Influenza,inj,Quad PF,6+ Mos 11/13/2021   Influenza-Unspecified 11/20/2020   PFIZER(Purple Top)SARS-COV-2 Vaccination 05/19/2019, 06/09/2019   PNEUMOCOCCAL CONJUGATE-20 11/30/2020   Td 10/20/2017   Tdap 10/13/2007    Health Maintenance  Topic Date Due   COVID-19 Vaccine (3 - Pfizer series) 08/04/2019   FOOT EXAM  11/30/2021   HEMOGLOBIN A1C  03/10/2022   MAMMOGRAM  04/17/2022   OPHTHALMOLOGY EXAM  06/27/2022   Diabetic kidney evaluation - GFR measurement  09/08/2022   Diabetic kidney evaluation - Urine ACR  09/08/2022   TETANUS/TDAP  12/02/2027   COLONOSCOPY (Pts 45-48yr Insurance coverage will need to be confirmed)  07/03/2031   INFLUENZA VACCINE  Completed   Hepatitis C Screening  Completed   HIV Screening  Completed   HPV VACCINES  Aged Out     Discussed health benefits of physical activity, and encouraged her to engage in regular exercise appropriate for her age and condition.  Problem List Items Addressed This Visit       Endocrine   Hyperlipidemia associated with type 2 diabetes mellitus ([place]    Pt managed on atorvastatin 20 mg LDL goal < 70 Will repeat fasting lipids      Relevant Orders   Lipid Profile   Well controlled type 2 diabetes mellitus without evidence of diabetic end organ manifestation ([place]    Last a1c 6.6% , ordered repeat a1c Managing with jardiance 25 mg, metformin 1000 mg bid, mounjaro 7.5 mg  On statin, acei Foot exam completed uacr utd optho utd 3/22  336lbs, 7/23 293 lbs, today 298 lbs F/u 6 mo if a1c stable.       Relevant Orders   HgB A1c     Other   Depression, major, single episode, mild ([place])    Current working dx but I do think it is plausible she has ptsd given her history Dicussed therapy again -- placed referral Discussed medication, likely would choose lexapro, consider wellbutrin. Pt has history of being on paxil and benzo before with minimal improvement.  Pt will f/u 2 weeks to discuss further        Relevant Orders   Ambulatory referral to Psychology   Other Visit Diagnoses     Annual physical exam    -  Primary   Relevant Orders   TSH        Return in about 2 weeks (around 12/18/2021) for depression.     I, [name], PA-C have reviewed all documentation for this visit. The documentation on  12/04/2021  for the exam, diagnosis, procedures, and orders are all accurate and complete.  [name], PA-C [facility] [address] [place], [place], [address] Office: [phone] Fax: [place]

## 2021-12-04 ENCOUNTER — Encounter: Payer: Self-pay | Admitting: Physician Assistant

## 2021-12-04 ENCOUNTER — Ambulatory Visit (INDEPENDENT_AMBULATORY_CARE_PROVIDER_SITE_OTHER): Payer: Managed Care, Other (non HMO) | Admitting: Physician Assistant

## 2021-12-04 VITALS — BP 126/75 | HR 75 | Temp 98.6°F | Ht 67.0 in | Wt 298.0 lb

## 2021-12-04 DIAGNOSIS — E1169 Type 2 diabetes mellitus with other specified complication: Secondary | ICD-10-CM | POA: Diagnosis not present

## 2021-12-04 DIAGNOSIS — E119 Type 2 diabetes mellitus without complications: Secondary | ICD-10-CM

## 2021-12-04 DIAGNOSIS — Z0000 Encounter for general adult medical examination without abnormal findings: Secondary | ICD-10-CM | POA: Diagnosis not present

## 2021-12-04 DIAGNOSIS — F320 Major depressive disorder, single episode, mild: Secondary | ICD-10-CM

## 2021-12-04 DIAGNOSIS — F339 Major depressive disorder, recurrent, unspecified: Secondary | ICD-10-CM | POA: Insufficient documentation

## 2021-12-04 DIAGNOSIS — E785 Hyperlipidemia, unspecified: Secondary | ICD-10-CM

## 2021-12-04 NOTE — Assessment & Plan Note (Signed)
Current working dx but I do think it is plausible she has ptsd given her history Dicussed therapy again -- placed referral Discussed medication, likely would choose lexapro, consider wellbutrin. Pt has history of being on paxil and benzo before with minimal improvement.  Pt will f/u 2 weeks to discuss further

## 2021-12-04 NOTE — Assessment & Plan Note (Signed)
Pt managed on atorvastatin 20 mg LDL goal < 70 Will repeat fasting lipids

## 2021-12-04 NOTE — Assessment & Plan Note (Addendum)
Last a1c 6.6% , ordered repeat a1c Managing with jardiance 25 mg, metformin 1000 mg bid, mounjaro 7.5 mg  On statin, acei Foot exam completed uacr utd optho utd 3/22 336lbs, 7/23 293 lbs, today 298 lbs F/u 6 mo if a1c stable.

## 2021-12-10 ENCOUNTER — Other Ambulatory Visit: Payer: Self-pay | Admitting: Physician Assistant

## 2021-12-10 DIAGNOSIS — N393 Stress incontinence (female) (male): Secondary | ICD-10-CM

## 2021-12-13 NOTE — Progress Notes (Signed)
I,[name],acting as a Education administrator for [employer], PA-C.,have documented all relevant documentation on the behalf of [name], PA-C,as directed by  [name], PA-C while in the presence of [name], PA-C.   Established patient visit   Patient: Leslie Morris   DOB: August 31, 1970   51 y.o. Female  MRN: [id number] Visit Date: 12/14/2021  Today's healthcare provider: [name], PA-C   Cc. Depression fu  Subjective    HPI  Depression, Follow-up  She  was last seen for this 2 weeks ago. Changes made at last visit include dicussed therapy again -- placed referral . Pt has not heard about an appt yet. Has thought about medication.      12/14/2021    9:59 AM 12/04/2021    1:53 PM 09/07/2021   10:31 AM  Depression screen PHQ 2/9  Decreased Interest 2 1 0  Down, Depressed, Hopeless 2 1 0  PHQ - 2 Score 4 2 0  Altered sleeping _0 Tired, decreased energy _1 Change in appetite 1 1 0  Feeling bad or failure about yourself  1 0 0  Trouble concentrating 1 2 0  Moving slowly or fidgety/restless 0 0 0  Suicidal thoughts 0 0 0  PHQ-9 Score _2 Difficult doing work/chores Somewhat difficult Somewhat difficult Not difficult at all      12/14/2021    9:59 AM 02/19/2019    3:08 PM 12/17/2018    1:02 PM  GAD 7 : Generalized Anxiety Score  Nervous, Anxious, on Edge _3 Control/stop worrying _4 Worry too much - different things _5 Trouble relaxing _6 Restless _7 Easily annoyed or irritable _8 Afraid - awful might happen _9 Total GAD 7 Score _10 Anxiety Difficulty Somewhat difficult Very difficult Extremely difficult      -----------------------------------------------------------------------------------------   Medications: Outpatient Medications Prior to Visit  Medication Sig   aspirin EC 81 MG tablet Take 81 mg by mouth daily.   atorvastatin (LIPITOR) 20 MG tablet Take 1 tablet (20 mg total) by mouth daily.    Blood Pressure Monitoring (BLOOD PRESSURE KIT) DEVI Check bp in am and pm 2-3 times a week   celecoxib (CELEBREX) 100 MG capsule TAKE 1 CAPSULE (100 MG TOTAL) BY MOUTH 2 (TWO) TIMES DAILY AS NEEDED FOR MODERATE PAIN.   cyclobenzaprine (FLEXERIL) 5 MG tablet Take 1 tablet (5 mg total) by mouth at bedtime as needed for muscle spasms.   ipratropium (ATROVENT) 0.03 % nasal spray Place 2 sprays into both nostrils every 12 (twelve) hours.   JARDIANCE 25 MG TABS tablet TAKE 1 TABLET (25 MG TOTAL) BY MOUTH DAILY.   lisinopril (ZESTRIL) 5 MG tablet TAKE 1 TABLET (5 MG TOTAL) BY MOUTH DAILY.   metFORMIN (GLUCOPHAGE) 1000 MG tablet TAKE 1 TABLET (1,000 MG TOTAL) BY MOUTH TWICE A DAY WITH FOOD   MOUNJARO 7.5 MG/0.5ML Pen INJECT 7.5 MG SUBCUTANEOUSLY WEEKLY   OneTouch Delica Lancets 03E MISC USE AS DIRECTED 3 TIMES A DAY   ONETOUCH VERIO test strip USE AS DIRECTED   oxybutynin (DITROPAN) 5 MG tablet TAKE 1 TABLET BY MOUTH THREE TIMES A DAY   [DISCONTINUED] promethazine (PHENERGAN) 12.5 MG tablet Take 1 tablet (12.5 mg total) by mouth every 8 (eight) hours as needed for nausea or vomiting.   No facility-administered  medications prior to visit.    Review of Systems  Constitutional:  Negative for appetite change, chills, fatigue and fever.  Respiratory:  Negative for chest tightness and shortness of breath.   Cardiovascular:  Negative for chest pain and palpitations.  Gastrointestinal:  Negative for abdominal pain, nausea and vomiting.  Neurological:  Negative for dizziness and weakness.    Objective    BP 120/71 (BP Location: Left Arm, Patient Position: Sitting, Cuff Size: Large)   Pulse 66   Wt 297 lb 1.6 oz (134.8 kg)   LMP 11/30/1998 (Approximate) Comment: Hysterectomy at age 27 yo  SpO2 100%   BMI 46.53 kg/m  Blood pressure 120/71, pulse 66, weight 297 lb 1.6 oz (134.8 kg), last menstrual period 11/30/1998, SpO2 100 %.   Physical Exam Vitals reviewed.  Constitutional:      Appearance: She  is not ill-appearing.  HENT:     Head: Normocephalic.  Eyes:     Conjunctiva/sclera: Conjunctivae normal.  Cardiovascular:     Rate and Rhythm: Normal rate.  Pulmonary:     Effort: Pulmonary effort is normal. No respiratory distress.  Neurological:     General: No focal deficit present.     Mental Status: She is alert and oriented to person, place, and time.  Psychiatric:        Mood and Affect: Mood normal.        Behavior: Behavior normal.      No results found for any visits on 12/14/21.  Assessment & Plan     Problem List Items Addressed This Visit       Other   Nausea    2/2 mounjaro, intermittent. Pt likes to have rx on hand.       Relevant Medications   promethazine (PHENERGAN) 12.5 MG tablet   Depression, major, single episode, mild ([place]) - Primary    Will check on therapy referral. Discussed medication - -given chronic pain discussed cymbalta, discussed wellbutrin Given her options, pt would like to try wellbutrin, I think this is a fair choice given depression prevalence, need for additional weight management Advised starting 150 mg xr can increase to 300 mg as soon as a week  Discussed SE, pt has no seizure history and does not drink alcohol F/u 4 weeks      Relevant Medications   buPROPion (WELLBUTRIN XL) 150 MG 24 hr tablet     Return in about 4 weeks (around 01/11/2022) for depression.      I, [name], PA-C have reviewed all documentation for this visit. The documentation on  12/14/2021   for the exam, diagnosis, procedures, and orders are all accurate and complete.  [name], PA-C [facility] [address] [place], [place], [address] Office: [phone] Fax: [place]

## 2021-12-14 ENCOUNTER — Ambulatory Visit: Payer: Managed Care, Other (non HMO) | Admitting: Physician Assistant

## 2021-12-14 ENCOUNTER — Encounter: Payer: Self-pay | Admitting: Physician Assistant

## 2021-12-14 VITALS — BP 120/71 | HR 66 | Wt 297.1 lb

## 2021-12-14 DIAGNOSIS — R110 Nausea: Secondary | ICD-10-CM

## 2021-12-14 DIAGNOSIS — F320 Major depressive disorder, single episode, mild: Secondary | ICD-10-CM | POA: Diagnosis not present

## 2021-12-14 MED ORDER — BUPROPION HCL ER (XL) 150 MG PO TB24: 150.0000 mg | ORAL_TABLET | Freq: Every day | ORAL | 1 refills | Status: DC

## 2021-12-14 MED ORDER — PROMETHAZINE HCL 12.5 MG PO TABS: 12.5000 mg | ORAL_TABLET | Freq: Three times a day (TID) | ORAL | 2 refills | Status: DC | PRN

## 2021-12-14 NOTE — Assessment & Plan Note (Signed)
Will check on therapy referral. Discussed medication - -given chronic pain discussed cymbalta, discussed wellbutrin Given her options, pt would like to try wellbutrin, I think this is a fair choice given depression prevalence, need for additional weight management Advised starting 150 mg xr can increase to 300 mg as soon as a week  Discussed SE, pt has no seizure history and does not drink alcohol F/u 4 weeks

## 2021-12-14 NOTE — Assessment & Plan Note (Signed)
2/2 mounjaro, intermittent. Pt likes to have rx on hand.

## 2021-12-15 LAB — LIPID PANEL
Chol/HDL Ratio: 3.8 ratio (ref 0.0–4.4)
Cholesterol, Total: 152 mg/dL (ref 100–199)
HDL: 40 mg/dL (ref >39)
LDL Chol Calc (NIH): 87 mg/dL (ref 0–99)
Triglycerides: 143 mg/dL (ref 0–149)
VLDL Cholesterol Cal: 25 mg/dL (ref 5–40)

## 2021-12-15 LAB — TSH: TSH: 2.53 u[IU]/mL (ref 0.450–4.500)

## 2021-12-15 LAB — HEMOGLOBIN A1C
Est. average glucose Bld gHb Est-mCnc: 143 mg/dL
Hgb A1c MFr Bld: 6.6 % — ABNORMAL HIGH (ref 4.8–5.6)

## 2021-12-18 ENCOUNTER — Encounter: Payer: Self-pay | Admitting: Physician Assistant

## 2021-12-18 ENCOUNTER — Other Ambulatory Visit: Payer: Self-pay | Admitting: Physician Assistant

## 2021-12-18 DIAGNOSIS — F4323 Adjustment disorder with mixed anxiety and depressed mood: Secondary | ICD-10-CM

## 2021-12-18 DIAGNOSIS — F320 Major depressive disorder, single episode, mild: Secondary | ICD-10-CM

## 2021-12-26 ENCOUNTER — Telehealth: Payer: Managed Care, Other (non HMO) | Admitting: Physician Assistant

## 2021-12-26 DIAGNOSIS — B9789 Other viral agents as the cause of diseases classified elsewhere: Secondary | ICD-10-CM | POA: Diagnosis not present

## 2021-12-26 DIAGNOSIS — J019 Acute sinusitis, unspecified: Secondary | ICD-10-CM | POA: Diagnosis not present

## 2021-12-26 MED ORDER — PREDNISONE 20 MG PO TABS: 20.0000 mg | ORAL_TABLET | Freq: Every day | ORAL | 0 refills | Status: DC

## 2021-12-26 MED ORDER — FLUTICASONE PROPIONATE 50 MCG/ACT NA SUSP: 2.0000 | Freq: Every day | NASAL | 0 refills | Status: DC

## 2021-12-26 NOTE — Progress Notes (Signed)
E-Visit for Sinus Problems  We are sorry that you are not feeling well.  Here is how we plan to help!  Based on what you have shared with me it looks like you have sinusitis.  Sinusitis is inflammation and infection in the sinus cavities of the head.  Based on your presentation I believe you most likely have Acute Viral Sinusitis.This is an infection most likely caused by a virus. There is not specific treatment for viral sinusitis other than to help you with the symptoms until the infection runs its course.  You may use an oral decongestant such as Mucinex D or if you have glaucoma or high blood pressure use plain Mucinex. Saline nasal spray help and can safely be used as often as needed for congestion, I have prescribed: Fluticasone nasal spray two sprays in each nostril once a day and Prednisone '20mg'$  Take 1 tablet daily for 5 days. Monitor blood sugars closely and stop prednisone and be seen in person if blood sugar goes to 400 or above.   Some authorities believe that zinc sprays or the use of Echinacea may shorten the course of your symptoms.  Sinus infections are not as easily transmitted as other respiratory infection, however we still recommend that you avoid close contact with loved ones, especially the very young and elderly.  Remember to wash your hands thoroughly throughout the day as this is the number one way to prevent the spread of infection!  Home Care: Only take medications as instructed by your medical team. Do not take these medications with alcohol. A steam or ultrasonic humidifier can help congestion.  You can place a towel over your head and breathe in the steam from hot water coming from a faucet. Avoid close contacts especially the very young and the elderly. Cover your mouth when you cough or sneeze. Always remember to wash your hands.  Get Help Right Away If: You develop worsening fever or sinus pain. You develop a severe head ache or visual changes. Your symptoms  persist after you have completed your treatment plan.  Make sure you Understand these instructions. Will watch your condition. Will get help right away if you are not doing well or get worse.   Thank you for choosing an e-visit.  Your e-visit answers were reviewed by a board certified advanced clinical practitioner to complete your personal care plan. Depending upon the condition, your plan could have included both over the counter or prescription medications.  Please review your pharmacy choice. Make sure the pharmacy is open so you can pick up prescription now. If there is a problem, you may contact your provider through [employer] and have the prescription routed to another pharmacy.  Your safety is important to [place]. If you have drug allergies check your prescription carefully.   For the next 24 hours you can use MyChart to ask questions about today's visit, request a non-urgent call back, or ask for a work or school excuse. You will get an email in the next two days asking about your experience. I hope that your e-visit has been valuable and will speed your recovery.  I have spent 5 minutes in review of e-visit questionnaire, review and updating patient chart, medical decision making and response to patient.   Mar Daring, PA-C

## 2021-12-30 ENCOUNTER — Other Ambulatory Visit: Payer: Self-pay | Admitting: Physician Assistant

## 2021-12-30 DIAGNOSIS — E119 Type 2 diabetes mellitus without complications: Secondary | ICD-10-CM

## 2022-01-10 NOTE — Progress Notes (Signed)
I,Connie R Striblin,acting as a Education administrator for [employer], PA-C.,have documented all relevant documentation on the behalf of Leslie Kirschner, PA-C,as directed by  Leslie Kirschner, PA-C while in the presence of Leslie Kirschner, PA-C.   Established patient visit   Patient: Leslie Morris   DOB: 08-Dec-1970   51 y.o. Female  MRN: [id number] Visit Date: 01/11/2022  Today's healthcare provider: [name], PA-C   Cc. depression  Subjective    HPI  Pt would like to discuss increasing her dose of Mounjaro.  Depression, Follow-up  She  was last seen for this 4 weeks ago. Changes made at last visit include none.   She reports excellent compliance with treatment. She is not having side effects.   She reports excellent tolerance of treatment. Current symptoms include: depressed mood, difficulty concentrating, and fatigue She feels she is Unchanged since last visit.      01/11/2022    3:09 PM 12/14/2021    9:59 AM 12/04/2021    1:53 PM  Depression screen PHQ 2/9  Decreased Interest _0 Down, Depressed, Hopeless _1 PHQ - 2 Score _2 Altered sleeping _3 Tired, decreased energy _4 Change in appetite _5 Feeling bad or failure about yourself  0 1 0  Trouble concentrating _6 Moving slowly or fidgety/restless 0 0 0  Suicidal thoughts 0 0 0  PHQ-9 Score _7 Difficult doing work/chores Not difficult at all Somewhat difficult Somewhat difficult    -----------------------------------------------------------------------------------------   Medications: Outpatient Medications Prior to Visit  Medication Sig   aspirin EC 81 MG tablet Take 81 mg by mouth daily.   atorvastatin (LIPITOR) 20 MG tablet Take 1 tablet (20 mg total) by mouth daily.   Blood Pressure Monitoring (BLOOD PRESSURE KIT) DEVI Check bp in am and pm 2-3 times a week   celecoxib (CELEBREX) 100 MG capsule TAKE 1 CAPSULE (100 MG TOTAL) BY MOUTH 2 (TWO) TIMES DAILY AS NEEDED FOR MODERATE  PAIN.   cyclobenzaprine (FLEXERIL) 5 MG tablet Take 1 tablet (5 mg total) by mouth at bedtime as needed for muscle spasms.   fluticasone (FLONASE) 50 MCG/ACT nasal spray Place 2 sprays into both nostrils daily.   ipratropium (ATROVENT) 0.03 % nasal spray Place 2 sprays into both nostrils every 12 (twelve) hours.   JARDIANCE 25 MG TABS tablet TAKE 1 TABLET (25 MG TOTAL) BY MOUTH DAILY.   lisinopril (ZESTRIL) 5 MG tablet TAKE 1 TABLET (5 MG TOTAL) BY MOUTH DAILY.   metFORMIN (GLUCOPHAGE) 1000 MG tablet TAKE 1 TABLET (1,000 MG TOTAL) BY MOUTH TWICE A DAY WITH FOOD   OneTouch Delica Lancets 76O MISC USE AS DIRECTED 3 TIMES A DAY   ONETOUCH VERIO test strip USE AS DIRECTED   oxybutynin (DITROPAN) 5 MG tablet TAKE 1 TABLET BY MOUTH THREE TIMES A DAY   predniSONE (DELTASONE) 20 MG tablet Take 1 tablet (20 mg total) by mouth daily with breakfast.   promethazine (PHENERGAN) 12.5 MG tablet Take 1 tablet (12.5 mg total) by mouth every 8 (eight) hours as needed for nausea or vomiting.   [DISCONTINUED] buPROPion (WELLBUTRIN XL) 150 MG 24 hr tablet Take 1 tablet (150 mg total) by mouth daily.   [DISCONTINUED] MOUNJARO 7.5 MG/0.5ML Pen INJECT 7.5 MG SUBCUTANEOUSLY WEEKLY   No facility-administered medications prior to visit.    Review of Systems  Constitutional:  Negative for fatigue and  fever.  Respiratory:  Negative for cough and shortness of breath.   Cardiovascular:  Negative for chest pain and leg swelling.  Gastrointestinal:  Negative for abdominal pain.  Neurological:  Negative for dizziness and headaches.      Objective    BP 125/86 (BP Location: Right Arm, Patient Position: Sitting, Cuff Size: Large)   Pulse 76   Temp 98.2 F (36.8 C) (Oral)   Ht _0  (1.702 m)   Wt (!) 300 lb 14.4 oz (136.5 kg)   LMP 11/30/1998 (Approximate) Comment: Hysterectomy at age 56 yo  SpO2 100%   BMI 47.13 kg/m  Blood pressure 125/86, pulse 76, temperature 98.2 F (36.8 C), temperature source Oral, height  _1  (1.702 m), weight (!) 300 lb 14.4 oz (136.5 kg), last menstrual period 11/30/1998, SpO2 100 %.   Physical Exam Vitals reviewed.  Constitutional:      Appearance: She is not ill-appearing.  HENT:     Head: Normocephalic.  Eyes:     Conjunctiva/sclera: Conjunctivae normal.  Cardiovascular:     Rate and Rhythm: Normal rate.  Pulmonary:     Effort: Pulmonary effort is normal. No respiratory distress.  Neurological:     General: No focal deficit present.     Mental Status: She is alert and oriented to person, place, and time.  Psychiatric:        Mood and Affect: Mood normal.        Behavior: Behavior normal.      No results found for any visits on 01/11/22.  Assessment & Plan     Problem List Items Addressed This Visit       Endocrine   Well controlled type 2 diabetes mellitus without evidence of diabetic end organ manifestation [facility])    Ok with increasing mounjaro dose to 10 mg. Advised of potential se of nausea, acid reflux, vomiting, pt has phenergan rx          Relevant Medications   tirzepatide (MOUNJARO) 10 MG/0.5ML Pen     Other   Depression, major, single episode, mild ([facility]) - Primary    No improvement advised increasing dose to 300 mg  Again stressed therapy      Relevant Medications   buPROPion (WELLBUTRIN XL) 300 MG 24 hr tablet   hydrOXYzine (VISTARIL) 50 MG capsule   Other insomnia    Trial of hydroxyzine prn        Relevant Medications   hydrOXYzine (VISTARIL) 50 MG capsule     Return in about 8 weeks (around 03/08/2022) for depression.     I, Leslie Kirschner, PA-C have reviewed all documentation for this visit. The documentation on  01/11/2022 for the exam, diagnosis, procedures, and orders are all accurate and complete.  Leslie Kirschner, PA-C [facility] [address] [place], [place], [address] Office: [phone] Fax: [place]

## 2022-01-11 ENCOUNTER — Encounter: Payer: Self-pay | Admitting: Physician Assistant

## 2022-01-11 ENCOUNTER — Ambulatory Visit (INDEPENDENT_AMBULATORY_CARE_PROVIDER_SITE_OTHER): Payer: Managed Care, Other (non HMO) | Admitting: Physician Assistant

## 2022-01-11 VITALS — BP 125/86 | HR 76 | Temp 98.2°F | Ht 67.0 in | Wt 300.9 lb

## 2022-01-11 DIAGNOSIS — G4709 Other insomnia: Secondary | ICD-10-CM | POA: Diagnosis not present

## 2022-01-11 DIAGNOSIS — F320 Major depressive disorder, single episode, mild: Secondary | ICD-10-CM

## 2022-01-11 DIAGNOSIS — E119 Type 2 diabetes mellitus without complications: Secondary | ICD-10-CM

## 2022-01-11 MED ORDER — TIRZEPATIDE 10 MG/0.5ML ~~LOC~~ SOAJ: 10.0000 mg | SUBCUTANEOUS | 1 refills | Status: DC

## 2022-01-11 MED ORDER — BUPROPION HCL ER (XL) 300 MG PO TB24: 300.0000 mg | ORAL_TABLET | Freq: Every day | ORAL | 1 refills | Status: DC

## 2022-01-11 MED ORDER — HYDROXYZINE PAMOATE 50 MG PO CAPS: 50.0000 mg | ORAL_CAPSULE | Freq: Every evening | ORAL | 0 refills | Status: DC | PRN

## 2022-01-11 NOTE — Patient Instructions (Signed)
Therapy--- Call! [phone]

## 2022-01-11 NOTE — Assessment & Plan Note (Signed)
Trial of hydroxyzine prn

## 2022-01-11 NOTE — Assessment & Plan Note (Signed)
Ok with increasing mounjaro dose to 10 mg. Advised of potential se of nausea, acid reflux, vomiting, pt has phenergan rx

## 2022-01-11 NOTE — Assessment & Plan Note (Signed)
No improvement advised increasing dose to 300 mg  Again stressed therapy

## 2022-02-01 ENCOUNTER — Ambulatory Visit: Payer: Managed Care, Other (non HMO) | Admitting: Physician Assistant

## 2022-02-01 ENCOUNTER — Encounter: Payer: Self-pay | Admitting: Physician Assistant

## 2022-02-01 VITALS — BP 130/78 | HR 86 | Temp 97.8°F | Resp 16 | Wt 296.1 lb

## 2022-02-01 DIAGNOSIS — J0110 Acute frontal sinusitis, unspecified: Secondary | ICD-10-CM

## 2022-02-01 DIAGNOSIS — F320 Major depressive disorder, single episode, mild: Secondary | ICD-10-CM

## 2022-02-01 DIAGNOSIS — J029 Acute pharyngitis, unspecified: Secondary | ICD-10-CM

## 2022-02-01 LAB — POC COVID19 BINAXNOW: SARS Coronavirus 2 Ag: NEGATIVE

## 2022-02-01 MED ORDER — PREDNISONE 20 MG PO TABS: 20.0000 mg | ORAL_TABLET | Freq: Every day | ORAL | 0 refills | Status: DC

## 2022-02-01 NOTE — Assessment & Plan Note (Signed)
Pt symptoms have improved. No longer dealing with insomnia Continue wellbutrin 300 mg, hydroxyzine prn  F/u 3-4 mo or earlier if needed

## 2022-02-01 NOTE — Progress Notes (Signed)
I,Joseline E Rosas,acting as a scribe for [employer], PA-C.,have documented all relevant documentation on the behalf of [name], PA-C,as directed by  [name], PA-C while in the presence of [name], PA-C.  Established patient visit   Patient: Leslie Morris   DOB: [birth year]   51 y.o. Female  MRN: [id number] Visit Date: 02/01/2022  Today's healthcare provider: [name], PA-C   Chief Complaint  Patient presents with   URI   Subjective      Pt reports runny nose, sneezing, sore throat x 2-3 days. Denies fever, sob, wheezing, cough. She has taken dayquil/nyquil for her symptoms.   Depression f/u -much improved with higher dose of wellbutrin. Has not needed to take the hydroxyzine yet.   Medications: Outpatient Medications Prior to Visit  Medication Sig   aspirin EC 81 MG tablet Take 81 mg by mouth daily.   atorvastatin (LIPITOR) 20 MG tablet Take 1 tablet (20 mg total) by mouth daily.   Blood Pressure Monitoring (BLOOD PRESSURE KIT) DEVI Check bp in am and pm 2-3 times a week   buPROPion (WELLBUTRIN XL) 300 MG 24 hr tablet Take 1 tablet (300 mg total) by mouth daily.   celecoxib (CELEBREX) 100 MG capsule TAKE 1 CAPSULE (100 MG TOTAL) BY MOUTH 2 (TWO) TIMES DAILY AS NEEDED FOR MODERATE PAIN.   cyclobenzaprine (FLEXERIL) 5 MG tablet Take 1 tablet (5 mg total) by mouth at bedtime as needed for muscle spasms.   fluticasone (FLONASE) 50 MCG/ACT nasal spray Place 2 sprays into both nostrils daily.   hydrOXYzine (VISTARIL) 50 MG capsule Take 1 capsule (50 mg total) by mouth at bedtime as needed.   ipratropium (ATROVENT) 0.03 % nasal spray Place 2 sprays into both nostrils every 12 (twelve) hours.   JARDIANCE 25 MG TABS tablet TAKE 1 TABLET (25 MG TOTAL) BY MOUTH DAILY.   lisinopril (ZESTRIL) 5 MG tablet TAKE 1 TABLET (5 MG TOTAL) BY MOUTH DAILY.   metFORMIN (GLUCOPHAGE) 1000 MG tablet TAKE 1 TABLET (1,000 MG TOTAL) BY MOUTH TWICE A DAY WITH FOOD    OneTouch Delica Lancets 34K MISC USE AS DIRECTED 3 TIMES A DAY   ONETOUCH VERIO test strip USE AS DIRECTED   oxybutynin (DITROPAN) 5 MG tablet TAKE 1 TABLET BY MOUTH THREE TIMES A DAY   promethazine (PHENERGAN) 12.5 MG tablet Take 1 tablet (12.5 mg total) by mouth every 8 (eight) hours as needed for nausea or vomiting.   tirzepatide (MOUNJARO) 10 MG/0.5ML Pen Inject 10 mg into the skin once a week.   [DISCONTINUED] predniSONE (DELTASONE) 20 MG tablet Take 1 tablet (20 mg total) by mouth daily with breakfast.   No facility-administered medications prior to visit.    Review of Systems  HENT:  Positive for rhinorrhea, sinus pressure and sore throat ("scratchy"). Negative for ear pain and sinus pain.   Respiratory:  Positive for cough (a little). Negative for wheezing.   Cardiovascular:  Negative for chest pain.  Musculoskeletal:  Negative for neck pain.  Neurological:  Negative for headaches.     Objective    BP 130/78 (BP Location: Right Arm, Patient Position: Sitting, Cuff Size: Large)   Pulse 86   Temp 97.8 F (36.6 C) (Oral)   Resp 16   Wt 296 lb 1.6 oz (134.3 kg)   LMP 11/30/1998 (Approximate) Comment: Hysterectomy at age 51 yo  SpO2 98%   BMI 46.38 kg/m   Physical Exam Constitutional:      General:  She is awake.     Appearance: She is well-developed.  HENT:     Head: Normocephalic.     Nose: Rhinorrhea present.     Mouth/Throat:     Pharynx: Posterior oropharyngeal erythema present. No oropharyngeal exudate.  Eyes:     Conjunctiva/sclera: Conjunctivae normal.  Cardiovascular:     Rate and Rhythm: Normal rate and regular rhythm.     Heart sounds: Normal heart sounds.  Pulmonary:     Effort: Pulmonary effort is normal.     Breath sounds: Normal breath sounds.  Skin:    General: Skin is warm.  Neurological:     Mental Status: She is alert and oriented to person, place, and time.  Psychiatric:        Attention and Perception: Attention normal.        Mood and  Affect: Mood normal.        Speech: Speech normal.        Behavior: Behavior is cooperative.      Results for orders placed or performed in visit on 02/01/22  POC COVID-19  Result Value Ref Range   SARS Coronavirus 2 Ag Negative Negative    Assessment & Plan     Acute sinusitis Poc  covid negative Viral likely. Rx prednisone 20 mg x 5 days for symptoms okay to continue otc therapy Increase fluids, use flonase If any fever, sob, wheezing please call office  Problem List Items Addressed This Visit       Other   Depression, major, single episode, mild ([place])    Pt symptoms have improved. No longer dealing with insomnia Continue wellbutrin 300 mg, hydroxyzine prn  F/u 3-4 mo or earlier if needed      Other Visit Diagnoses     Acute non-recurrent frontal sinusitis    -  Primary   Relevant Medications   predniSONE (DELTASONE) 20 MG tablet   Sore throat       Relevant Orders   POC COVID-19 (Completed)       Return in about 4 months (around 06/03/2022), or if symptoms worsen or fail to improve, for depression.      I, [name], PA-C have reviewed all documentation for this visit. The documentation on  02/01/2022  for the exam, diagnosis, procedures, and orders are all accurate and complete.  [name], PA-C [facility] [address] [place], [place], [address] Office: [phone] Fax: [place]

## 2022-02-02 ENCOUNTER — Encounter: Payer: Self-pay | Admitting: Physician Assistant

## 2022-02-02 ENCOUNTER — Other Ambulatory Visit: Payer: Self-pay | Admitting: Physician Assistant

## 2022-02-02 DIAGNOSIS — E119 Type 2 diabetes mellitus without complications: Secondary | ICD-10-CM

## 2022-02-05 ENCOUNTER — Other Ambulatory Visit: Payer: Self-pay | Admitting: Family Medicine

## 2022-02-05 NOTE — Telephone Encounter (Signed)
PA was started and approved.

## 2022-02-05 NOTE — Telephone Encounter (Signed)
Requested medication (s) are due for refill today: yes   Requested medication (s) are on the active medication list: yes   Last refill:  01/11/22 with 1 refill   Future visit scheduled: no   Notes to clinic:  Please see pharmacy comment.  Pharmacy comment: Alternative Requested:PA REQUIRED.     Requested Prescriptions  Pending Prescriptions Disp Refills   MOUNJARO 10 MG/0.5ML Pen [Pharmacy Med Name: MOUNJARO 10 MG/0.5 ML PEN]  1    Sig: INJECT 10 MG INTO THE SKIN ONE TIME PER WEEK     Off-Protocol Failed - 02/02/2022  1:35 PM      Failed - Medication not assigned to a protocol, review manually.      Passed - Valid encounter within last 12 months    Recent Outpatient Visits           4 days ago Acute non-recurrent frontal sinusitis   [facility] [name], PA-C   3 weeks ago Depression, major, single episode, mild ([place])   [facility] Thedore Mins, [place], PA-C   1 month ago Depression, major, single episode, mild ([place])   [facility] Thedore Mins, [place], PA-C   2 months ago Annual physical exam   [employer], Ria Comment, PA-C   5 months ago Well controlled type 2 diabetes mellitus without evidence of diabetic end organ manifestation [facility])   [facility] [name], [place]

## 2022-02-06 ENCOUNTER — Other Ambulatory Visit: Payer: Self-pay | Admitting: Physician Assistant

## 2022-02-06 DIAGNOSIS — G4709 Other insomnia: Secondary | ICD-10-CM

## 2022-02-25 ENCOUNTER — Other Ambulatory Visit: Payer: Self-pay | Admitting: Physician Assistant

## 2022-02-25 DIAGNOSIS — G8929 Other chronic pain: Secondary | ICD-10-CM

## 2022-02-25 NOTE — Telephone Encounter (Signed)
Medication Refill - Medication: celecoxib (CELEBREX) 100 MG capsule   Has the patient contacted their pharmacy? Yes.   Pt says that she has been waiting for Rx and is currently out. Pt says that she was told by pharmacy that they have not received response from office, not showing request via chart.   Preferred Pharmacy (with phone number or street name): [id number] [place] [address] [id number]: [phone] Fax: [phone]  Has the patient been seen for an appointment in the last year OR does the patient have an upcoming appointment? Yes.    Agent: Please be advised that RX refills may take up to 3 business days. We ask that you follow-up with your pharmacy.

## 2022-02-26 NOTE — Telephone Encounter (Signed)
Requested medication (s) are due for refill today: yes  Requested medication (s) are on the active medication list: yes  Last refill:  10/11/21 #60 1 RF  Future visit scheduled: no  Notes to clinic:  overdue lab work   Requested Prescriptions  Pending Prescriptions Disp Refills   celecoxib (CELEBREX) 100 MG capsule 60 capsule 1    Sig: Take 1 capsule (100 mg total) by mouth 2 (two) times daily as needed for moderate pain.     Analgesics:  COX2 Inhibitors Failed - 02/25/2022  1:27 PM      Failed - Manual Review: Labs are only required if the patient has taken medication for more than 8 weeks.      Failed - HGB in normal range and within 360 days    Hemoglobin  Date Value Ref Range Status  11/22/2019 14.3 11.1 - 15.9 g/dL Final         Failed - HCT in normal range and within 360 days    Hematocrit  Date Value Ref Range Status  11/22/2019 43.9 34.0 - 46.6 % Final         Passed - Cr in normal range and within 360 days    Creatinine  Date Value Ref Range Status  09/29/2012 0.68 0.60 - 1.30 mg/dL Final   Creatinine, Ser  Date Value Ref Range Status  09/07/2021 0.77 0.57 - 1.00 mg/dL Final         Passed - AST in normal range and within 360 days    AST  Date Value Ref Range Status  09/07/2021 27 0 - 40 IU/L Final         Passed - ALT in normal range and within 360 days    ALT  Date Value Ref Range Status  09/07/2021 21 0 - 32 IU/L Final         Passed - eGFR is 30 or above and within 360 days    EGFR (African American)  Date Value Ref Range Status  09/29/2012 >60  Final   GFR calc Af Amer  Date Value Ref Range Status  11/22/2019 114 >59 mL/min/1.73 Final    Comment:    **[facility] currently reports eGFR in compliance with the current**   recommendations of the [employer]. [facility] will   update reporting as new guidelines are published from the NKF-ASN   Task force.    EGFR (Non-African Amer.)  Date Value Ref Range Status  09/29/2012 >60   Final    Comment:    eGFR values <42m/min/1.73 m2 may be an indication of chronic kidney disease (CKD). Calculated eGFR is useful in patients with stable renal function. The eGFR calculation will not be reliable in acutely ill patients when serum creatinine is changing rapidly. It is not useful in  patients on dialysis. The eGFR calculation may not be applicable to patients at the low and high extremes of body sizes, pregnant women, and vegetarians.    GFR calc non Af Amer  Date Value Ref Range Status  11/22/2019 99 >59 mL/min/1.73 Final   eGFR  Date Value Ref Range Status  09/07/2021 95 >59 mL/min/1.73 Final         Passed - Patient is not pregnant      Passed - Valid encounter within last 12 months    Recent Outpatient Visits           3 weeks ago Acute non-recurrent frontal [facility]itis   [facility]  1 month ago Depression, major, single episode, mild ([place])   [facility] [place], [place], PA-C   2 months ago Depression, major, single episode, mild [facility])   [facility] Thedore Mins, [place], PA-C   2 months ago Annual physical exam   [facility] Thedore Mins, [place], PA-C   5 months ago Well controlled type 2 diabetes mellitus without evidence of diabetic end organ manifestation [facility])   [facility] [name], [place]

## 2022-02-27 NOTE — Telephone Encounter (Signed)
Patient wanting a call back as to why her refills have not be sent in for Celebrex. She called Monday for this.

## 2022-02-28 MED ORDER — CELECOXIB 100 MG PO CAPS: 100.0000 mg | ORAL_CAPSULE | Freq: Two times a day (BID) | ORAL | 1 refills | Status: DC | PRN

## 2022-02-28 NOTE — Telephone Encounter (Signed)
Please review. Can refill be sent in?  KP

## 2022-03-05 ENCOUNTER — Encounter: Payer: Self-pay | Admitting: Physician Assistant

## 2022-03-10 ENCOUNTER — Other Ambulatory Visit: Payer: Self-pay | Admitting: Physician Assistant

## 2022-03-10 DIAGNOSIS — R300 Dysuria: Secondary | ICD-10-CM

## 2022-03-22 ENCOUNTER — Other Ambulatory Visit: Payer: Self-pay | Admitting: Physician Assistant

## 2022-03-22 DIAGNOSIS — E1165 Type 2 diabetes mellitus with hyperglycemia: Secondary | ICD-10-CM

## 2022-04-11 ENCOUNTER — Encounter: Payer: Self-pay | Admitting: Physician Assistant

## 2022-04-28 ENCOUNTER — Other Ambulatory Visit: Payer: Self-pay | Admitting: Physician Assistant

## 2022-04-28 DIAGNOSIS — E119 Type 2 diabetes mellitus without complications: Secondary | ICD-10-CM

## 2022-04-28 DIAGNOSIS — G8929 Other chronic pain: Secondary | ICD-10-CM

## 2022-05-06 ENCOUNTER — Other Ambulatory Visit: Payer: Self-pay | Admitting: Physician Assistant

## 2022-05-06 ENCOUNTER — Other Ambulatory Visit: Payer: Self-pay

## 2022-05-06 ENCOUNTER — Encounter: Payer: Self-pay | Admitting: Physician Assistant

## 2022-05-06 DIAGNOSIS — E119 Type 2 diabetes mellitus without complications: Secondary | ICD-10-CM

## 2022-05-06 MED ORDER — MOUNJARO 10 MG/0.5ML ~~LOC~~ SOAJ
SUBCUTANEOUS | 1 refills | Status: DC
  Filled 2022-05-06: qty 6, 84d supply, fill #0
  Filled 2022-07-22: qty 2, 28d supply, fill #1
  Filled 2022-08-21: qty 2, 28d supply, fill #2
  Filled 2022-09-12: qty 2, 28d supply, fill #3

## 2022-05-06 NOTE — Progress Notes (Signed)
Refilled 10 mg mounjaro

## 2022-05-19 ENCOUNTER — Other Ambulatory Visit: Payer: Self-pay | Admitting: Physician Assistant

## 2022-05-19 DIAGNOSIS — R110 Nausea: Secondary | ICD-10-CM

## 2022-05-19 DIAGNOSIS — E1169 Type 2 diabetes mellitus with other specified complication: Secondary | ICD-10-CM

## 2022-06-09 ENCOUNTER — Other Ambulatory Visit: Payer: Self-pay | Admitting: Physician Assistant

## 2022-06-09 DIAGNOSIS — N393 Stress incontinence (female) (male): Secondary | ICD-10-CM

## 2022-06-10 NOTE — Progress Notes (Unsigned)
I,[name],acting as a Neurosurgeon for [employer], PA-C.,have documented all relevant documentation on the behalf of Leslie Ferguson, PA-C,as directed by  Leslie Ferguson, PA-C while in the presence of Leslie Ferguson, PA-C.  Established patient visit   Patient: Leslie Morris   DOB: 20-Jun-1970   52 y.o. Female  MRN: [id number] Visit Date: 06/11/2022  Today's healthcare provider: [name], PA-C   Cc. Dark colored urine  Subjective    HPI  Urinary symptoms  She reports new onset  dark colored urine . The current episode started a few days ago and is gradually worsening. Patient states symptoms are moderate in intensity, occurring constantly. She  has not been recently treated for similar symptoms. Patient reports being prone to kidney stones   Associated symptoms: Yes abdominal pain Yes back pain  No chills No constipation  No cramping No diarrhea  No discharge No fever  Yes hematuria No nausea  No vomiting    ---------------------------------------------------------------------------------------   Right sided flank pain last week for a few days, not as severe, with some mild lower abdominal pain. Urine was brown, then a dark red, and now brown again.   Took an alleve last week, regularly takes celebrex twice daily.   Medications: Outpatient Medications Prior to Visit  Medication Sig   aspirin EC 81 MG tablet Take 81 mg by mouth daily.   atorvastatin (LIPITOR) 20 MG tablet TAKE 1 TABLET BY MOUTH EVERY DAY   buPROPion (WELLBUTRIN XL) 300 MG 24 hr tablet Take 1 tablet (300 mg total) by mouth daily.   celecoxib (CELEBREX) 100 MG capsule TAKE 1 CAPSULE (100 MG TOTAL) BY MOUTH 2 (TWO) TIMES DAILY AS NEEDED FOR MODERATE PAIN.   fluticasone (FLONASE) 50 MCG/ACT nasal spray Place 2 sprays into both nostrils daily.   hydrOXYzine (VISTARIL) 50 MG capsule TAKE 1 CAPSULE (50 MG TOTAL) BY MOUTH AT BEDTIME AS NEEDED.   JARDIANCE 25 MG TABS tablet TAKE 1 TABLET (25 MG TOTAL)  BY MOUTH DAILY.   lisinopril (ZESTRIL) 5 MG tablet TAKE 1 TABLET (5 MG TOTAL) BY MOUTH DAILY.   metFORMIN (GLUCOPHAGE) 1000 MG tablet TAKE 1 TABLET (1,000 MG TOTAL) BY MOUTH TWICE A DAY WITH FOOD   OneTouch Delica Lancets 33G MISC USE AS DIRECTED 3 TIMES A DAY   ONETOUCH VERIO test strip USE AS DIRECTED   oxybutynin (DITROPAN) 5 MG tablet TAKE 1 TABLET BY MOUTH THREE TIMES A DAY   promethazine (PHENERGAN) 12.5 MG tablet TAKE 1 TABLET (12.5 MG TOTAL) BY MOUTH EVERY 8 HOURS AS NEEDED FOR NAUSEA AND VOMITING   tirzepatide (MOUNJARO) 10 MG/0.5ML Pen INJECT 10 MG INTO THE SKIN ONE TIME PER WEEK   Blood Pressure Monitoring (BLOOD PRESSURE KIT) DEVI Check bp in am and pm 2-3 times a week   cyclobenzaprine (FLEXERIL) 5 MG tablet Take 1 tablet (5 mg total) by mouth at bedtime as needed for muscle spasms. (Patient not taking: Reported on 06/11/2022)   ipratropium (ATROVENT) 0.03 % nasal spray Place 2 sprays into both nostrils every 12 (twelve) hours. (Patient not taking: Reported on 06/11/2022)   [DISCONTINUED] predniSONE (DELTASONE) 20 MG tablet Take 1 tablet (20 mg total) by mouth daily with breakfast. (Patient not taking: Reported on 06/11/2022)   No facility-administered medications prior to visit.    Review of Systems  Constitutional:  Negative for fatigue and fever.  Respiratory:  Negative for cough and shortness of breath.   Cardiovascular:  Negative for chest pain and leg  swelling.  Gastrointestinal:  Negative for abdominal pain.  Neurological:  Negative for dizziness and headaches.       Objective    Blood pressure 123/89, pulse 74, weight 288 lb 1.6 oz (130.7 kg), last menstrual period 11/30/1998, SpO2 98 %.   Physical Exam Vitals reviewed.  Constitutional:      Appearance: She is not ill-appearing.  HENT:     Head: Normocephalic.  Eyes:     Conjunctiva/sclera: Conjunctivae normal.  Cardiovascular:     Rate and Rhythm: Normal rate.  Pulmonary:     Effort: Pulmonary effort is  normal. No respiratory distress.  Neurological:     General: No focal deficit present.     Mental Status: She is alert and oriented to person, place, and time.  Psychiatric:        Mood and Affect: Mood normal.        Behavior: Behavior normal.      Results for orders placed or performed in visit on 06/11/22  POCT Urinalysis Dipstick  Result Value Ref Range   Color, UA dark brown    Clarity, UA clear    Glucose, UA Positive (A) Negative   Bilirubin, UA negative    Ketones, UA negative]    Spec Grav, UA 1.015 1.010 - 1.025   Blood, UA large    pH, UA 5.0 5.0 - 8.0   Protein, UA Negative Negative   Urobilinogen, UA 0.2 0.2 or 1.0 E.U./dL   Nitrite, UA positive    Leukocytes, UA Trace (A) Negative   Appearance     Odor      Assessment & Plan     1. Dark urine 2. Acute cystitis with hematuria 3. Gross hematuria Saw urine myself, tea-colored  Treating as UTI for now given nitrites on UA  Bactrim bid x 5 days. Ordered culture, urine micro Will check cbc, cmp   Pt aware not to take additional NSAIDs. We have discussed nsaid risk w/ celebrex previously; but this has controlled pt's pain well.   - POCT Urinalysis Dipstick - Urine Culture - Comprehensive Metabolic Panel (CMET) - CBC w/Diff/Platelet  . Breast cancer screening by mammogram - MM 3D SCREENING MAMMOGRAM BILATERAL BREAST; Future  Return if symptoms worsen or fail to improve.      I, Leslie Ferguson, PA-C have reviewed all documentation for this visit. The documentation on  06/11/22   for the exam, diagnosis, procedures, and orders are all accurate and complete.  Leslie Ferguson, PA-C [facility] [address] [place], [place], [address] Office: [phone] Fax: [phone]   [facility]

## 2022-06-11 ENCOUNTER — Ambulatory Visit: Payer: Managed Care, Other (non HMO) | Admitting: Physician Assistant

## 2022-06-11 ENCOUNTER — Encounter: Payer: Self-pay | Admitting: Physician Assistant

## 2022-06-11 ENCOUNTER — Other Ambulatory Visit: Payer: Self-pay

## 2022-06-11 VITALS — BP 123/89 | HR 74 | Wt 288.1 lb

## 2022-06-11 DIAGNOSIS — R310 Gross hematuria: Secondary | ICD-10-CM

## 2022-06-11 DIAGNOSIS — E119 Type 2 diabetes mellitus without complications: Secondary | ICD-10-CM

## 2022-06-11 DIAGNOSIS — N3001 Acute cystitis with hematuria: Secondary | ICD-10-CM

## 2022-06-11 DIAGNOSIS — Z1231 Encounter for screening mammogram for malignant neoplasm of breast: Secondary | ICD-10-CM | POA: Diagnosis not present

## 2022-06-11 DIAGNOSIS — R82998 Other abnormal findings in urine: Secondary | ICD-10-CM | POA: Diagnosis not present

## 2022-06-11 LAB — POCT URINALYSIS DIPSTICK
Bilirubin, UA: NEGATIVE
Glucose, UA: POSITIVE — AB
Ketones, UA: NEGATIVE
Nitrite, UA: POSITIVE
Protein, UA: NEGATIVE
Spec Grav, UA: 1.015
Urobilinogen, UA: 0.2 U/dL
pH, UA: 5

## 2022-06-11 MED ORDER — SULFAMETHOXAZOLE-TRIMETHOPRIM 800-160 MG PO TABS
1.0000 | ORAL_TABLET | Freq: Two times a day (BID) | ORAL | 0 refills | Status: AC
Start: 2022-06-11 — End: 2022-06-16
  Filled 2022-06-11: qty 10, 5d supply, fill #0

## 2022-06-11 MED ORDER — MOUNJARO 10 MG/0.5ML ~~LOC~~ SOAJ: 10.0000 mg | SUBCUTANEOUS | 2 refills | Status: DC

## 2022-06-11 MED ORDER — ATORVASTATIN CALCIUM 20 MG PO TABS
20.0000 mg | ORAL_TABLET | Freq: Every day | ORAL | 3 refills | Status: DC
  Filled 2022-09-29: qty 90, 90d supply, fill #0

## 2022-06-11 MED FILL — Promethazine HCl Tab 12.5 MG: ORAL | 10 days supply | Qty: 30 | Fill #0 | Status: AC

## 2022-06-11 MED FILL — Glucose Blood Test Strip: 90 days supply | Qty: 100 | Fill #0 | Status: CN

## 2022-06-11 MED FILL — Metformin HCl Tab 1000 MG: ORAL | 90 days supply | Qty: 180 | Fill #0 | Status: AC

## 2022-06-12 ENCOUNTER — Other Ambulatory Visit: Payer: Self-pay | Admitting: Physician Assistant

## 2022-06-12 ENCOUNTER — Ambulatory Visit
Admission: RE | Admit: 2022-06-12 | Discharge: 2022-06-12 | Disposition: A | Discharge status: Home / Self Care | Payer: Managed Care, Other (non HMO) | Attending: Physician Assistant | Admitting: Physician Assistant

## 2022-06-12 ENCOUNTER — Ambulatory Visit
Admission: RE | Admit: 2022-06-12 | Discharge: 2022-06-12 | Disposition: A | Discharge status: Home / Self Care | Payer: Managed Care, Other (non HMO) | Source: Ambulatory Visit | Attending: Physician Assistant | Admitting: Physician Assistant

## 2022-06-12 DIAGNOSIS — R82998 Other abnormal findings in urine: Secondary | ICD-10-CM | POA: Insufficient documentation

## 2022-06-12 LAB — COMPREHENSIVE METABOLIC PANEL
ALT: 19 IU/L (ref 0–32)
AST: 15 IU/L (ref 0–40)
Albumin/Globulin Ratio: 1.7 (ref 1.2–2.2)
Albumin: 4.5 g/dL (ref 3.9–4.9)
Alkaline Phosphatase: 92 IU/L (ref 44–121)
BUN/Creatinine Ratio: 17 (ref 9–23)
BUN: 14 mg/dL (ref 6–24)
Bilirubin Total: 0.6 mg/dL (ref 0.0–1.2)
CO2: 17 mmol/L — ABNORMAL LOW (ref 20–29)
Calcium: 10 mg/dL (ref 8.7–10.2)
Chloride: 102 mmol/L (ref 96–106)
Creatinine, Ser: 0.81 mg/dL (ref 0.57–1.00)
Globulin, Total: 2.7 g/dL (ref 1.5–4.5)
Glucose: 95 mg/dL (ref 70–99)
Potassium: 4.5 mmol/L (ref 3.5–5.2)
Sodium: 140 mmol/L (ref 134–144)
Total Protein: 7.2 g/dL (ref 6.0–8.5)
eGFR: 89 mL/min/{1.73_m2} (ref >59)

## 2022-06-12 LAB — URINALYSIS, MICROSCOPIC ONLY
Casts: NONE SEEN /lpf
RBC, Urine: >30 /hpf — AB (ref 0–2)

## 2022-06-12 LAB — CBC WITH DIFFERENTIAL/PLATELET
Basophils Absolute: 0.1 10*3/uL (ref 0.0–0.2)
Basos: 1 %
EOS (ABSOLUTE): 0.1 10*3/uL (ref 0.0–0.4)
Eos: 1 %
Hematocrit: 41.5 % (ref 34.0–46.6)
Hemoglobin: 13.1 g/dL (ref 11.1–15.9)
Immature Grans (Abs): 0 10*3/uL (ref 0.0–0.1)
Immature Granulocytes: 0 %
Lymphocytes Absolute: 2.6 10*3/uL (ref 0.7–3.1)
Lymphs: 24 %
MCH: 24.8 pg — ABNORMAL LOW (ref 26.6–33.0)
MCHC: 31.6 g/dL (ref 31.5–35.7)
MCV: 79 fL (ref 79–97)
Monocytes Absolute: 0.6 10*3/uL (ref 0.1–0.9)
Monocytes: 6 %
Neutrophils Absolute: 7.5 10*3/uL — ABNORMAL HIGH (ref 1.4–7.0)
Neutrophils: 68 %
Platelets: 313 10*3/uL (ref 150–450)
RBC: 5.28 x10E6/uL (ref 3.77–5.28)
RDW: 14.7 % (ref 11.7–15.4)
WBC: 11 10*3/uL — ABNORMAL HIGH (ref 3.4–10.8)

## 2022-06-13 LAB — URINE CULTURE

## 2022-06-14 ENCOUNTER — Encounter: Payer: Self-pay | Admitting: Physician Assistant

## 2022-06-14 ENCOUNTER — Other Ambulatory Visit: Payer: Self-pay | Admitting: Physician Assistant

## 2022-06-14 DIAGNOSIS — N3001 Acute cystitis with hematuria: Secondary | ICD-10-CM

## 2022-06-14 LAB — URINE CULTURE

## 2022-06-14 MED ORDER — CEPHALEXIN 500 MG PO CAPS
500.0000 mg | ORAL_CAPSULE | Freq: Two times a day (BID) | ORAL | 0 refills | Status: AC
Start: 2022-06-14 — End: 2022-06-19

## 2022-06-24 ENCOUNTER — Other Ambulatory Visit: Payer: Self-pay | Admitting: Physician Assistant

## 2022-06-24 DIAGNOSIS — G8929 Other chronic pain: Secondary | ICD-10-CM

## 2022-07-01 LAB — HM DIABETES EYE EXAM

## 2022-07-02 ENCOUNTER — Ambulatory Visit: Payer: Managed Care, Other (non HMO) | Admitting: Physician Assistant

## 2022-07-04 ENCOUNTER — Other Ambulatory Visit: Payer: Self-pay

## 2022-07-04 ENCOUNTER — Encounter: Payer: Self-pay | Admitting: Physician Assistant

## 2022-07-04 ENCOUNTER — Ambulatory Visit: Payer: Managed Care, Other (non HMO) | Admitting: Physician Assistant

## 2022-07-04 VITALS — BP 135/88 | HR 71 | Temp 98.0°F | Resp 14 | Ht 67.0 in | Wt 289.9 lb

## 2022-07-04 DIAGNOSIS — R3915 Urgency of urination: Secondary | ICD-10-CM

## 2022-07-04 DIAGNOSIS — N393 Stress incontinence (female) (male): Secondary | ICD-10-CM | POA: Diagnosis not present

## 2022-07-04 LAB — POCT URINALYSIS DIPSTICK
Bilirubin, UA: NEGATIVE
Glucose, UA: POSITIVE — AB
Ketones, UA: NEGATIVE
Nitrite, UA: POSITIVE
Protein, UA: POSITIVE — AB
Spec Grav, UA: 1.025 (ref 1.010–1.025)
Urobilinogen, UA: NEGATIVE E.U./dL — AB
pH, UA: 6 (ref 5.0–8.0)

## 2022-07-04 MED ORDER — CEPHALEXIN 500 MG PO CAPS
500.0000 mg | ORAL_CAPSULE | Freq: Four times a day (QID) | ORAL | 0 refills | Status: AC
Start: 2022-07-04 — End: 2022-07-11
  Filled 2022-07-04: qty 28, 7d supply, fill #0

## 2022-07-04 NOTE — Progress Notes (Signed)
I,Vanessa  Vital,acting as a Neurosurgeon for [employer], PA-C.,have documented all relevant documentation on the behalf of [name], PA-C,as directed by  [name], PA-C while in the presence of [name], PA-C.    Established patient visit   Patient: Leslie Morris   DOB: June 09, 1970   52 y.o. Female  MRN: [id number] Visit Date: 07/04/2022  Today's healthcare provider: [name], PA-C     Subjective    HPI  Patient is here for UTI symptoms, states was already given antibiotics about two weeks ago. States that she felt it got better but has started feeling the urgency to urinate and some pain after she urinates. Patient reports she does get kidney stones and has had some back pain also.  She wants to discuss urinary leaking and oxybutinin use.   Medications: Outpatient Medications Prior to Visit  Medication Sig   aspirin EC 81 MG tablet Take 81 mg by mouth daily.   atorvastatin (LIPITOR) 20 MG tablet TAKE 1 TABLET BY MOUTH EVERY DAY   atorvastatin (LIPITOR) 20 MG tablet Take 1 tablet (20 mg total) by mouth daily.   buPROPion (WELLBUTRIN XL) 300 MG 24 hr tablet Take 1 tablet (300 mg total) by mouth daily.   celecoxib (CELEBREX) 100 MG capsule TAKE 1 CAPSULE (100 MG TOTAL) BY MOUTH 2 (TWO) TIMES DAILY AS NEEDED FOR MODERATE PAIN.   cyclobenzaprine (FLEXERIL) 5 MG tablet Take 1 tablet (5 mg total) by mouth at bedtime as needed for muscle spasms.   fluticasone (FLONASE) 50 MCG/ACT nasal spray Place 2 sprays into both nostrils daily.   glucose blood (ONETOUCH VERIO) test strip USE AS DIRECTED   hydrOXYzine (VISTARIL) 50 MG capsule TAKE 1 CAPSULE (50 MG TOTAL) BY MOUTH AT BEDTIME AS NEEDED.   JARDIANCE 25 MG TABS tablet TAKE 1 TABLET (25 MG TOTAL) BY MOUTH DAILY.   lisinopril (ZESTRIL) 5 MG tablet Take 1 tablet (5 mg total) by mouth daily.   metFORMIN (GLUCOPHAGE) 1000 MG tablet Take 1 tablet (1,000 mg total) by mouth 2 (two) times daily with food.Letta Pate Delica  Lancets 33G MISC USE AS DIRECTED 3 TIMES A DAY   oxybutynin (DITROPAN) 5 MG tablet TAKE 1 TABLET BY MOUTH THREE TIMES A DAY   promethazine (PHENERGAN) 12.5 MG tablet Take 1 tablet (12.5 mg total) by mouth every 8 (eight) hours as needed FOR NAUSEA AND VOMITING.   tirzepatide (MOUNJARO) 10 MG/0.5ML Pen INJECT 10 MG INTO THE SKIN ONE TIME PER WEEK   tirzepatide (MOUNJARO) 10 MG/0.5ML Pen Inject 10 mg into the skin once a week.   [DISCONTINUED] Blood Pressure Monitoring (BLOOD PRESSURE KIT) DEVI Check bp in am and pm 2-3 times a week (Patient not taking: Reported on 07/04/2022)   [DISCONTINUED] ipratropium (ATROVENT) 0.03 % nasal spray Place 2 sprays into both nostrils every 12 (twelve) hours. (Patient not taking: Reported on 07/04/2022)   No facility-administered medications prior to visit.    Review of Systems  Constitutional:  Negative for fatigue and fever.  Respiratory:  Negative for cough and shortness of breath.   Cardiovascular:  Negative for chest pain and leg swelling.  Gastrointestinal:  Negative for abdominal pain.  Genitourinary:  Positive for dysuria, frequency and urgency.  Neurological:  Negative for dizziness and headaches.      Objective    Blood pressure 135/88, pulse 71, temperature 98 F (36.7 C), temperature source Oral, resp. rate 14, height 5\' 7"  (1.702 m), weight 289 lb 14.4 oz (  131.5 kg), last menstrual period 11/30/1998, SpO2 100 %.    Physical Exam Vitals reviewed.  Constitutional:      Appearance: She is not ill-appearing.  HENT:     Head: Normocephalic.  Eyes:     Conjunctiva/sclera: Conjunctivae normal.  Cardiovascular:     Rate and Rhythm: Normal rate.  Pulmonary:     Effort: Pulmonary effort is normal. No respiratory distress.  Neurological:     General: No focal deficit present.     Mental Status: She is alert and oriented to person, place, and time.  Psychiatric:        Mood and Affect: Mood normal.        Behavior: Behavior normal.      No  results found for any visits on 07/04/22.  Assessment & Plan     1. Urgency of urination UA w/ trace blood Pt has nephrolithiasis, none large, possibility contributing to UTIs; Repeat culture and will do another round of keflex.  Advised if culture neg or symptoms return would refer back to uro  - POCT Urinalysis Dipstick - Urine Culture - cephALEXin (KEFLEX) 500 MG capsule; Take 1 capsule (500 mg total) by mouth 4 (four) times daily for 7 days.  Dispense: 28 capsule; Refill: 0  2. Stress incontinence S/p hysterectomy 2003 and macroplastique implant in 2014 Manages w/ oxybutinin Recommending pelvic floor therapy  Pt will consider  Return in about 1 month (around 08/04/2022), or if symptoms worsen or fail to improve, for DMII.      I, [name], PA-C have reviewed all documentation for this visit. The documentation on  07/04/22   for the exam, diagnosis, procedures, and orders are all accurate and complete.  [name], PA-C [facility] [address] [place], [place], [address] Office: [phone] Fax: [phone]   [facility]

## 2022-07-07 LAB — URINE CULTURE

## 2022-07-09 LAB — URINE CULTURE

## 2022-07-10 ENCOUNTER — Other Ambulatory Visit: Payer: Self-pay | Admitting: Physician Assistant

## 2022-07-10 DIAGNOSIS — F320 Major depressive disorder, single episode, mild: Secondary | ICD-10-CM

## 2022-07-15 ENCOUNTER — Encounter: Payer: Self-pay | Admitting: Physician Assistant

## 2022-07-16 ENCOUNTER — Other Ambulatory Visit: Payer: Self-pay | Admitting: Physician Assistant

## 2022-07-16 ENCOUNTER — Telehealth: Payer: Self-pay

## 2022-07-16 NOTE — Telephone Encounter (Signed)
Have already spoke with patient and sent message to provider

## 2022-07-16 NOTE — Telephone Encounter (Unsigned)
Copied from CRM [id number]. Topic: General - Other >> Jul 16, 2022 11:57 AM Turkey B wrote: Reason for CRM: pt returned call back, she says she wasn't interested in talking to "anyone, bu then asked who Dr Lillia Abed wanted her to see. Please call back

## 2022-07-19 ENCOUNTER — Other Ambulatory Visit: Payer: Self-pay | Admitting: Physician Assistant

## 2022-07-19 DIAGNOSIS — N63 Unspecified lump in unspecified breast: Secondary | ICD-10-CM

## 2022-07-19 DIAGNOSIS — R928 Other abnormal and inconclusive findings on diagnostic imaging of breast: Secondary | ICD-10-CM

## 2022-07-22 ENCOUNTER — Other Ambulatory Visit: Payer: Self-pay

## 2022-07-27 ENCOUNTER — Other Ambulatory Visit: Payer: Self-pay

## 2022-07-27 MED FILL — Celecoxib Cap 100 MG: ORAL | 30 days supply | Qty: 60 | Fill #0 | Status: AC

## 2022-07-28 ENCOUNTER — Other Ambulatory Visit: Payer: Self-pay

## 2022-08-01 ENCOUNTER — Other Ambulatory Visit: Payer: Self-pay | Admitting: Family Medicine

## 2022-08-01 DIAGNOSIS — G4709 Other insomnia: Secondary | ICD-10-CM

## 2022-08-01 NOTE — Telephone Encounter (Signed)
Requested Prescriptions  Pending Prescriptions Disp Refills   hydrOXYzine (VISTARIL) 50 MG capsule [Pharmacy Med Name: HYDROXYZINE PAM 50 MG CAP] 90 capsule 1    Sig: TAKE 1 CAPSULE (50 MG TOTAL) BY MOUTH AT BEDTIME AS NEEDED.     Ear, Nose, and Throat:  Antihistamines 2 Passed - 08/01/2022  2:28 AM      Passed - Cr in normal range and within 360 days    Creatinine  Date Value Ref Range Status  09/29/2012 0.68 0.60 - 1.30 mg/dL Final   Creatinine, Ser  Date Value Ref Range Status  06/11/2022 0.81 0.57 - 1.00 mg/dL Final         Passed - Valid encounter within last 12 months    Recent Outpatient Visits           4 weeks ago Urgency of urination   [place] [facility] [name], PA-C   1 month ago Dark urine   [facility] [place], [place], PA-C   6 months ago Acute non-recurrent frontal sinusitis   [place] [facility] [name], PA-C   6 months ago Depression, major, single episode, mild [facility])   [place] [facility] [name], PA-C   7 months ago Depression, major, single episode, mild [facility])   [facility] [name], [place]       Future Appointments             Tomorrow [name], PA-C [facility]

## 2022-08-02 ENCOUNTER — Other Ambulatory Visit: Payer: Self-pay

## 2022-08-02 ENCOUNTER — Ambulatory Visit: Payer: Managed Care, Other (non HMO) | Admitting: Physician Assistant

## 2022-08-02 ENCOUNTER — Encounter: Payer: Self-pay | Admitting: Physician Assistant

## 2022-08-02 VITALS — BP 127/83 | HR 72 | Temp 98.2°F | Resp 13 | Ht 67.0 in | Wt 285.1 lb

## 2022-08-02 DIAGNOSIS — H6501 Acute serous otitis media, right ear: Secondary | ICD-10-CM | POA: Diagnosis not present

## 2022-08-02 DIAGNOSIS — F339 Major depressive disorder, recurrent, unspecified: Secondary | ICD-10-CM

## 2022-08-02 DIAGNOSIS — E119 Type 2 diabetes mellitus without complications: Secondary | ICD-10-CM | POA: Diagnosis not present

## 2022-08-02 DIAGNOSIS — S39012A Strain of muscle, fascia and tendon of lower back, initial encounter: Secondary | ICD-10-CM | POA: Diagnosis not present

## 2022-08-02 LAB — POCT GLYCOSYLATED HEMOGLOBIN (HGB A1C): Hemoglobin A1C: 6.1 % — AB (ref 4.0–5.6)

## 2022-08-02 MED ORDER — EMPAGLIFLOZIN 25 MG PO TABS
25.0000 mg | ORAL_TABLET | Freq: Every day | ORAL | 1 refills | Status: DC
Start: 2022-08-02 — End: 2022-09-05
  Filled 2022-08-02 – 2022-08-21 (×6): qty 90, 90d supply, fill #0

## 2022-08-02 MED ORDER — FLUTICASONE PROPIONATE 50 MCG/ACT NA SUSP
2.0000 | Freq: Every day | NASAL | 0 refills | Status: DC
  Filled 2022-08-02: qty 16, 30d supply, fill #0

## 2022-08-02 MED ORDER — HYDROXYZINE PAMOATE 50 MG PO CAPS
50.0000 mg | ORAL_CAPSULE | Freq: Every evening | ORAL | 1 refills | Status: DC | PRN
Start: 2022-08-02 — End: 2023-05-12
  Filled 2022-08-02 – 2022-08-09 (×2): qty 90, 90d supply, fill #0

## 2022-08-02 MED ORDER — BUPROPION HCL ER (XL) 300 MG PO TB24
300.0000 mg | ORAL_TABLET | Freq: Every day | ORAL | 1 refills | Status: DC
Start: 2022-08-02 — End: 2023-01-07
  Filled 2022-08-02 – 2022-09-29 (×8): qty 90, 90d supply, fill #0

## 2022-08-02 MED ORDER — CYCLOBENZAPRINE HCL 5 MG PO TABS
5.0000 mg | ORAL_TABLET | Freq: Every evening | ORAL | 2 refills | Status: DC | PRN
  Filled 2022-08-02: qty 30, 30d supply, fill #0

## 2022-08-02 MED FILL — Oxybutynin Chloride Tab 5 MG: ORAL | 90 days supply | Qty: 270 | Fill #0 | Status: CN

## 2022-08-02 MED FILL — Glucose Blood Test Strip: 90 days supply | Qty: 100 | Fill #0 | Status: AC

## 2022-08-02 NOTE — Assessment & Plan Note (Signed)
On wellbutrin 300 mg  Relatively stable  Elements of PTSD Long discussion on recent events, recognizing intrusive thoughts Given cbt thought journal, how to reframe negative/intrusive thoughts

## 2022-08-02 NOTE — Patient Instructions (Addendum)
[facility] [place]  [address], [place], [place] [address]  [phone] 

## 2022-08-02 NOTE — Progress Notes (Signed)
I,Vanessa  Vital,acting as a Neurosurgeon for [employer], PA-C.,have documented all relevant documentation on the behalf of [name], PA-C,as directed by  [name], PA-C while in the presence of [name], PA-C.   Established patient visit   Patient: Leslie Morris   DOB: 1970/04/15   52 y.o. Female  MRN: [id number] Visit Date: 08/02/2022  Today's healthcare provider: [name], PA-C   Cc. Depression, DM f/u  Subjective    HPI  Patient states there has been a little pain in her right ear that comes a goes but states that it feels like when you get water in your ear.  States she would like to have it checked.  Diabetes Mellitus Type II, Follow-up  Lab Results  Component Value Date   HGBA1C 6.1 (A) 08/02/2022   HGBA1C 6.6 (H) 12/14/2021   HGBA1C 6.6 (H) 09/07/2021   Wt Readings from Last 3 Encounters:  08/02/22 285 lb 1.6 oz (129.3 kg)  07/04/22 289 lb 14.4 oz (131.5 kg)  06/11/22 288 lb 1.6 oz (130.7 kg)   Last seen for diabetes 1 months ago.  Management since then includes no changes. She reports excellent compliance with treatment. She is having side effects.  Symptoms: No fatigue No foot ulcerations  No appetite changes No nausea  No paresthesia of the feet  No polydipsia  No polyuria No visual disturbances   No vomiting     Home blood sugar records:  this morning 112  Pertinent Labs: Lab Results  Component Value Date   CHOL 152 12/14/2021   HDL 40 12/14/2021   LDLCALC 87 12/14/2021   TRIG 143 12/14/2021   CHOLHDL 3.8 12/14/2021   Lab Results  Component Value Date   NA 140 06/11/2022   K 4.5 06/11/2022   CREATININE 0.81 06/11/2022   EGFR 89 06/11/2022   LABMICR 54.9 09/07/2021   MICRALBCREAT 30 (H) 09/07/2021     ---------------------------------------------------------------------------------------------------   Medications: Outpatient Medications Prior to Visit  Medication Sig   aspirin EC 81 MG tablet Take 81 mg by mouth  daily.   atorvastatin (LIPITOR) 20 MG tablet Take 1 tablet (20 mg total) by mouth daily.   celecoxib (CELEBREX) 100 MG capsule Take 1 capsule (100 mg total) by mouth 2 (two) times daily as needed for moderate pain.   glucose blood (ONETOUCH VERIO) test strip USE AS DIRECTED   lisinopril (ZESTRIL) 5 MG tablet Take 1 tablet (5 mg total) by mouth daily.   metFORMIN (GLUCOPHAGE) 1000 MG tablet Take 1 tablet (1,000 mg total) by mouth 2 (two) times daily with food.Letta Pate Delica Lancets 33G MISC USE AS DIRECTED 3 TIMES A DAY   oxybutynin (DITROPAN) 5 MG tablet Take 1 tablet (5 mg total) by mouth 3 (three) times daily.   promethazine (PHENERGAN) 12.5 MG tablet Take 1 tablet (12.5 mg total) by mouth every 8 (eight) hours as needed FOR NAUSEA AND VOMITING.   tirzepatide (MOUNJARO) 10 MG/0.5ML Pen INJECT 10 MG INTO THE SKIN ONE TIME PER WEEK   [DISCONTINUED] buPROPion (WELLBUTRIN XL) 300 MG 24 hr tablet TAKE 1 TABLET BY MOUTH EVERY DAY   [DISCONTINUED] cyclobenzaprine (FLEXERIL) 5 MG tablet Take 1 tablet (5 mg total) by mouth at bedtime as needed for muscle spasms.   [DISCONTINUED] fluticasone (FLONASE) 50 MCG/ACT nasal spray Place 2 sprays into both nostrils daily.   [DISCONTINUED] hydrOXYzine (VISTARIL) 50 MG capsule TAKE 1 CAPSULE (50 MG TOTAL) BY MOUTH AT BEDTIME AS NEEDED.   [  DISCONTINUED] JARDIANCE 25 MG TABS tablet TAKE 1 TABLET (25 MG TOTAL) BY MOUTH DAILY.   [DISCONTINUED] tirzepatide (MOUNJARO) 10 MG/0.5ML Pen Inject 10 mg into the skin once a week.   [DISCONTINUED] atorvastatin (LIPITOR) 20 MG tablet TAKE 1 TABLET BY MOUTH EVERY DAY (Patient not taking: Reported on 08/02/2022)   No facility-administered medications prior to visit.    Review of Systems  All other systems reviewed and are negative.    Objective    Blood pressure 127/83, pulse 72, temperature 98.2 F (36.8 C), temperature source Oral, resp. rate 13, height 5\' 7"  (1.702 m), weight 285 lb 1.6 oz (129.3 kg), last menstrual  period 11/30/1998, SpO2 100 %.   Physical Exam Vitals reviewed.  Constitutional:      Appearance: She is not ill-appearing.  HENT:     Head: Normocephalic.     Left Ear: Tympanic membrane normal.     Ears:     Comments: Serous fluid behind R TM with minimal bulging no erythema Eyes:     Conjunctiva/sclera: Conjunctivae normal.  Cardiovascular:     Rate and Rhythm: Normal rate.  Pulmonary:     Effort: Pulmonary effort is normal. No respiratory distress.  Neurological:     General: No focal deficit present.     Mental Status: She is alert and oriented to person, place, and time.  Psychiatric:        Mood and Affect: Mood normal.        Behavior: Behavior normal.     Results for orders placed or performed in visit on 08/02/22  POCT glycosylated hemoglobin (Hb A1C)  Result Value Ref Range   Hemoglobin A1C 6.1 (A) 4.0 - 5.6 %   HbA1c POC (<> result, manual entry)     HbA1c, POC (prediabetic range)     HbA1c, POC (controlled diabetic range)      Assessment & Plan     Problem List Items Addressed This Visit       Endocrine   Well controlled type 2 diabetes mellitus without evidence of diabetic end organ manifestation (HCC) - Primary    A1c today 6.1% Manages with mounjaro 10 mg metformin 1000 mg bid, jardiance 25 mg Pt weight pre mounjaro 10/22 321 lbs, today 285 lbs On statin, ace Needs foot exam  Uacr utd  F/u 6 mo       Relevant Medications   empagliflozin (JARDIANCE) 25 MG TABS tablet   Other Relevant Orders   POCT glycosylated hemoglobin (Hb A1C) (Completed)     Other   Recurrent depression (HCC)    On wellbutrin 300 mg  Relatively stable  Elements of PTSD Long discussion on recent events, recognizing intrusive thoughts Given cbt thought journal, how to reframe negative/intrusive thoughts      Relevant Medications   hydrOXYzine (VISTARIL) 50 MG capsule   buPROPion (WELLBUTRIN XL) 300 MG 24 hr tablet   Other Visit Diagnoses     Strain of lumbar  region, initial encounter       Relevant Medications   cyclobenzaprine (FLEXERIL) 5 MG tablet   Non-recurrent acute serous otitis media of right ear          4. Non-recurrent acute serous otitis media of right ear Flonase, antihist otc.  Return in about 4 months (around 12/02/2022) for CPE.      I, [name], PA-C have reviewed all documentation for this visit. The documentation on  08/02/22   for the exam, diagnosis, procedures, and orders are all accurate  and complete.  [name], PA-C [facility] [address] [place], [place], [address] Office: [phone] Fax: [place]

## 2022-08-02 NOTE — Assessment & Plan Note (Addendum)
A1c today 6.1% Manages with mounjaro 10 mg metformin 1000 mg bid, jardiance 25 mg Pt weight pre mounjaro 10/22 321 lbs, today 285 lbs On statin, ace Needs foot exam  Uacr utd  F/u 6 mo

## 2022-08-07 ENCOUNTER — Other Ambulatory Visit: Payer: Self-pay

## 2022-08-09 ENCOUNTER — Other Ambulatory Visit: Payer: Self-pay

## 2022-08-16 ENCOUNTER — Other Ambulatory Visit: Payer: Self-pay

## 2022-08-16 MED FILL — Lisinopril Tab 5 MG: ORAL | 90 days supply | Qty: 90 | Fill #0 | Status: AC

## 2022-08-16 MED FILL — Oxybutynin Chloride Tab 5 MG: ORAL | 90 days supply | Qty: 270 | Fill #0 | Status: CN

## 2022-08-21 ENCOUNTER — Other Ambulatory Visit: Payer: Self-pay

## 2022-08-22 ENCOUNTER — Other Ambulatory Visit: Payer: Self-pay

## 2022-08-23 ENCOUNTER — Other Ambulatory Visit: Payer: Self-pay

## 2022-08-31 MED FILL — Celecoxib Cap 100 MG: ORAL | 30 days supply | Qty: 60 | Fill #1 | Status: CN

## 2022-09-01 ENCOUNTER — Encounter: Payer: Self-pay | Admitting: Physician Assistant

## 2022-09-01 DIAGNOSIS — G8929 Other chronic pain: Secondary | ICD-10-CM

## 2022-09-02 ENCOUNTER — Other Ambulatory Visit: Payer: Self-pay

## 2022-09-02 MED ORDER — CELECOXIB 100 MG PO CAPS
100.0000 mg | ORAL_CAPSULE | Freq: Two times a day (BID) | ORAL | 1 refills | Status: DC | PRN
Start: 2022-09-02 — End: 2022-11-01
  Filled 2022-09-03 (×2): qty 60, 30d supply, fill #0
  Filled 2022-09-29: qty 60, 30d supply, fill #1

## 2022-09-03 ENCOUNTER — Other Ambulatory Visit: Payer: Self-pay

## 2022-09-05 ENCOUNTER — Other Ambulatory Visit: Payer: Self-pay | Admitting: Physician Assistant

## 2022-09-05 DIAGNOSIS — E119 Type 2 diabetes mellitus without complications: Secondary | ICD-10-CM

## 2022-09-12 ENCOUNTER — Other Ambulatory Visit: Payer: Self-pay

## 2022-09-29 MED FILL — Oxybutynin Chloride Tab 5 MG: ORAL | 90 days supply | Qty: 270 | Fill #0 | Status: AC

## 2022-09-29 MED FILL — Metformin HCl Tab 1000 MG: ORAL | 90 days supply | Qty: 180 | Fill #1 | Status: AC

## 2022-09-30 ENCOUNTER — Other Ambulatory Visit: Payer: Self-pay

## 2022-10-04 ENCOUNTER — Other Ambulatory Visit: Payer: Self-pay

## 2022-10-04 MED ORDER — PSEUDOEPHEDRINE HCL ER 120 MG PO TB12
120.0000 mg | ORAL_TABLET | Freq: Two times a day (BID) | ORAL | 0 refills | Status: DC
  Filled 2022-10-04: qty 10, 5d supply, fill #0

## 2022-10-04 MED ORDER — IPRATROPIUM BROMIDE 0.06 % NA SOLN
2.0000 | Freq: Four times a day (QID) | NASAL | 0 refills | Status: DC
  Filled 2022-10-04: qty 15, 10d supply, fill #0

## 2022-10-08 ENCOUNTER — Other Ambulatory Visit: Payer: Self-pay | Admitting: Physician Assistant

## 2022-10-08 DIAGNOSIS — E119 Type 2 diabetes mellitus without complications: Secondary | ICD-10-CM

## 2022-10-09 ENCOUNTER — Encounter: Payer: Self-pay | Admitting: Physician Assistant

## 2022-10-09 ENCOUNTER — Other Ambulatory Visit: Payer: Self-pay

## 2022-10-09 ENCOUNTER — Other Ambulatory Visit: Payer: Self-pay | Admitting: Physician Assistant

## 2022-10-09 DIAGNOSIS — E119 Type 2 diabetes mellitus without complications: Secondary | ICD-10-CM

## 2022-10-10 ENCOUNTER — Other Ambulatory Visit: Payer: Self-pay

## 2022-10-10 ENCOUNTER — Ambulatory Visit: Payer: Managed Care, Other (non HMO) | Admitting: Physician Assistant

## 2022-10-10 ENCOUNTER — Telehealth (INDEPENDENT_AMBULATORY_CARE_PROVIDER_SITE_OTHER): Payer: Managed Care, Other (non HMO) | Admitting: Physician Assistant

## 2022-10-10 DIAGNOSIS — Z7985 Long-term (current) use of injectable non-insulin antidiabetic drugs: Secondary | ICD-10-CM

## 2022-10-10 DIAGNOSIS — E119 Type 2 diabetes mellitus without complications: Secondary | ICD-10-CM | POA: Diagnosis not present

## 2022-10-10 DIAGNOSIS — M2550 Pain in unspecified joint: Secondary | ICD-10-CM | POA: Diagnosis not present

## 2022-10-10 MED ORDER — MOUNJARO 10 MG/0.5ML ~~LOC~~ SOAJ
SUBCUTANEOUS | 1 refills | Status: DC
Start: 2022-10-10 — End: 2022-10-11
  Filled 2022-10-10 – 2022-10-11 (×2): qty 2, 28d supply, fill #0

## 2022-10-10 MED ORDER — TRAMADOL HCL 50 MG PO TABS
50.0000 mg | ORAL_TABLET | Freq: Three times a day (TID) | ORAL | 0 refills | Status: AC | PRN
Start: 2022-10-10 — End: 2022-10-16
  Filled 2022-10-10: qty 15, 5d supply, fill #0

## 2022-10-10 NOTE — Progress Notes (Signed)
MyChart Video Visit    Virtual Visit via Video Note   This format is felt to be most appropriate for this patient at this time. Physical exam was limited by quality of the video and audio technology used for the visit.   Patient location: work, alone Provider location: [email or web address]  I discussed the limitations of evaluation and management by telemedicine and the availability of in person appointments. The patient expressed understanding and agreed to proceed.  Patient: Leslie Morris   DOB: [birth year]   52 y.o. Female  MRN: [id number] Visit Date: 10/10/2022  Today's healthcare provider: [name], PA-C   Leslie Morris is an established patient with me from the [facility] office, this is her first appointment with [facility]. Chief Complaint  Patient presents with   Medication Refill   Subjective    Medication Refill Associated symptoms include arthralgias and myalgias. Pertinent negatives include no abdominal pain, chest pain, coughing, fatigue, fever or headaches.    Pt reports her chronic joint pain is worsening over the past few weeks. Denies recent illness or injury.   Pt presents for medication follow up, needs a refill on Mounjaro and her pharmacy is changing.  Medications: Outpatient Medications Prior to Visit  Medication Sig   aspirin EC 81 MG tablet Take 81 mg by mouth daily.   atorvastatin (LIPITOR) 20 MG tablet Take 1 tablet (20 mg total) by mouth daily.   buPROPion (WELLBUTRIN XL) 300 MG 24 hr tablet Take 1 tablet (300 mg total) by mouth daily.   celecoxib (CELEBREX) 100 MG capsule Take 1 capsule (100 mg total) by mouth 2 (two) times daily as needed for moderate pain.   cyclobenzaprine (FLEXERIL) 5 MG tablet Take 1 tablet (5 mg total) by mouth at bedtime as needed for muscle spasms.   fluticasone (FLONASE) 50 MCG/ACT nasal spray Place 2 sprays into both nostrils daily.   glucose blood (ONETOUCH VERIO) test strip USE AS DIRECTED   hydrOXYzine (VISTARIL) 50 MG capsule Take 1  capsule (50 mg total) by mouth at bedtime as needed.   ipratropium (ATROVENT) 0.06 % nasal spray Place 2-4 sprays into each nostril 4 (four) times daily.   JARDIANCE 25 MG TABS tablet TAKE 1 TABLET (25 MG TOTAL) BY MOUTH DAILY.   lisinopril (ZESTRIL) 5 MG tablet Take 1 tablet (5 mg total) by mouth daily.   metFORMIN (GLUCOPHAGE) 1000 MG tablet Take 1 tablet (1,000 mg total) by mouth 2 (two) times daily with food.Letta Pate Delica Lancets 33G MISC USE AS DIRECTED 3 TIMES A DAY   oxybutynin (DITROPAN) 5 MG tablet Take 1 tablet (5 mg total) by mouth 3 (three) times daily.   promethazine (PHENERGAN) 12.5 MG tablet Take 1 tablet (12.5 mg total) by mouth every 8 (eight) hours as needed FOR NAUSEA AND VOMITING.   pseudoephedrine (SUDAFED) 120 MG 12 hr tablet Take 1 tablet (120 mg total) by mouth every 12 (twelve) hours for 7 days as needed   [DISCONTINUED] tirzepatide (MOUNJARO) 10 MG/0.5ML Pen INJECT 10 MG INTO THE SKIN ONE TIME PER WEEK   No facility-administered medications prior to visit.    Review of Systems  Constitutional:  Negative for fatigue and fever.  Respiratory:  Negative for cough and shortness of breath.   Cardiovascular:  Negative for chest pain and leg swelling.  Gastrointestinal:  Negative for abdominal pain.  Musculoskeletal:  Positive for arthralgias and myalgias.  Neurological:  Negative for dizziness and headaches.      Objective  LMP 11/30/1998 (Approximate) Comment: Hysterectomy at age 58 yo   Physical Exam Constitutional:      Appearance: Normal appearance. She is not ill-appearing.  Neurological:     Mental Status: She is oriented to person, place, and time.  Psychiatric:        Mood and Affect: Mood normal.        Behavior: Behavior normal.        Assessment & Plan     Problem List Items Addressed This Visit       Endocrine   Well controlled type 2 diabetes mellitus without evidence of diabetic end organ manifestation (HCC)    Refill mounjaro  10 mg  F/u still as recommended 12/24      Relevant Medications   tirzepatide (MOUNJARO) 10 MG/0.5ML Pen     Other   Multiple joint pain - Primary    D/c celebrex for a few days and switch to tramadol  Would like to avoid steroid use Previous autoimmune w/u If no improvement consider short steroid course and ref to rhuem      Relevant Medications   traMADol (ULTRAM) 50 MG tablet     Return in about 4 months (around 02/09/2023), or if symptoms worsen or fail to improve, for CPE.     I discussed the assessment and treatment plan with the patient. The patient was provided an opportunity to ask questions and all were answered. The patient agreed with the plan and demonstrated an understanding of the instructions.   The patient was advised to call back or seek an in-person evaluation if the symptoms worsen or if the condition fails to improve as anticipated.  I provided 10 minutes of non-face-to-face time during this encounter.  I, [name], PA-C have reviewed all documentation for this visit. The documentation on  10/10/22   for the exam, diagnosis, procedures, and orders are all accurate and complete.   [name], PA-C [facility] at [facility] [phone] (phone) [phone] (fax)  [facility]

## 2022-10-10 NOTE — Assessment & Plan Note (Signed)
Refill mounjaro 10 mg  F/u still as recommended 12/24

## 2022-10-10 NOTE — Progress Notes (Deleted)
Established patient visit   Patient: Leslie Morris   DOB: Apr 26, 1970   52 y.o. Female  MRN: [id number] Visit Date: 10/10/2022  Today's healthcare provider: [name], PA-C   Cc. Dm fu  Subjective    HPI  This is Michelle's first appt at [facility] but she is an established patient with me from [facility].  She presents today for continuation of care and needing refills on her Dm meds.  Medications: Outpatient Medications Prior to Visit  Medication Sig   aspirin EC 81 MG tablet Take 81 mg by mouth daily.   atorvastatin (LIPITOR) 20 MG tablet Take 1 tablet (20 mg total) by mouth daily.   buPROPion (WELLBUTRIN XL) 300 MG 24 hr tablet Take 1 tablet (300 mg total) by mouth daily.   celecoxib (CELEBREX) 100 MG capsule Take 1 capsule (100 mg total) by mouth 2 (two) times daily as needed for moderate pain.   cyclobenzaprine (FLEXERIL) 5 MG tablet Take 1 tablet (5 mg total) by mouth at bedtime as needed for muscle spasms.   fluticasone (FLONASE) 50 MCG/ACT nasal spray Place 2 sprays into both nostrils daily.   glucose blood (ONETOUCH VERIO) test strip USE AS DIRECTED   hydrOXYzine (VISTARIL) 50 MG capsule Take 1 capsule (50 mg total) by mouth at bedtime as needed.   ipratropium (ATROVENT) 0.06 % nasal spray Place 2-4 sprays into each nostril 4 (four) times daily.   JARDIANCE 25 MG TABS tablet TAKE 1 TABLET (25 MG TOTAL) BY MOUTH DAILY.   lisinopril (ZESTRIL) 5 MG tablet Take 1 tablet (5 mg total) by mouth daily.   metFORMIN (GLUCOPHAGE) 1000 MG tablet Take 1 tablet (1,000 mg total) by mouth 2 (two) times daily with food.Letta Pate Delica Lancets 33G MISC USE AS DIRECTED 3 TIMES A DAY   oxybutynin (DITROPAN) 5 MG tablet Take 1 tablet (5 mg total) by mouth 3 (three) times daily.   promethazine (PHENERGAN) 12.5 MG tablet Take 1 tablet (12.5 mg total) by mouth every 8 (eight) hours as needed FOR NAUSEA AND VOMITING.   pseudoephedrine (SUDAFED) 120 MG 12 hr tablet Take  1 tablet (120 mg total) by mouth every 12 (twelve) hours for 7 days as needed   tirzepatide (MOUNJARO) 10 MG/0.5ML Pen INJECT 10 MG INTO THE SKIN ONE TIME PER WEEK   No facility-administered medications prior to visit.    Review of Systems {Insert previous labs (optional):23779} {See past labs  Heme  Chem  Endocrine  Serology  Results Review (optional):1}   Objective    LMP 11/30/1998 (Approximate) Comment: Hysterectomy at age 45 yo {Insert last BP/Wt (optional):23777}{See vitals history (optional):1}  Physical Exam  ***  No results found for any visits on 10/10/22.  Assessment & Plan     ***  No follow-ups on file.      {provider attestation***:1}   [name], PA-C  [facility] at [facility] [phone] (phone) [phone] (fax)  [facility]

## 2022-10-10 NOTE — Assessment & Plan Note (Signed)
D/c celebrex for a few days and switch to tramadol  Would like to avoid steroid use Previous autoimmune w/u If no improvement consider short steroid course and ref to rhuem

## 2022-10-11 ENCOUNTER — Other Ambulatory Visit: Payer: Self-pay

## 2022-10-11 ENCOUNTER — Other Ambulatory Visit: Payer: Self-pay | Admitting: Physician Assistant

## 2022-10-11 DIAGNOSIS — E119 Type 2 diabetes mellitus without complications: Secondary | ICD-10-CM

## 2022-10-11 MED ORDER — MOUNJARO 10 MG/0.5ML ~~LOC~~ SOAJ
SUBCUTANEOUS | 1 refills | Status: DC
Start: 2022-10-25 — End: 2022-10-15

## 2022-10-15 ENCOUNTER — Other Ambulatory Visit: Payer: Self-pay

## 2022-10-15 ENCOUNTER — Other Ambulatory Visit: Payer: Self-pay | Admitting: Physician Assistant

## 2022-10-15 DIAGNOSIS — E119 Type 2 diabetes mellitus without complications: Secondary | ICD-10-CM

## 2022-10-15 MED ORDER — MOUNJARO 10 MG/0.5ML ~~LOC~~ SOAJ
SUBCUTANEOUS | 1 refills | Status: DC
  Filled 2022-10-15: qty 2, 28d supply, fill #0

## 2022-11-01 ENCOUNTER — Other Ambulatory Visit: Payer: Self-pay | Admitting: Physician Assistant

## 2022-11-01 ENCOUNTER — Other Ambulatory Visit: Payer: Self-pay

## 2022-11-01 DIAGNOSIS — E119 Type 2 diabetes mellitus without complications: Secondary | ICD-10-CM

## 2022-11-01 DIAGNOSIS — G8929 Other chronic pain: Secondary | ICD-10-CM

## 2022-11-01 MED ORDER — MOUNJARO 10 MG/0.5ML ~~LOC~~ SOAJ
10.0000 mg | SUBCUTANEOUS | 1 refills | Status: DC
Start: 2022-11-01 — End: 2022-12-04
  Filled 2022-11-01 – 2022-11-06 (×4): qty 2, 28d supply, fill #0

## 2022-11-01 MED ORDER — CELECOXIB 100 MG PO CAPS
100.0000 mg | ORAL_CAPSULE | Freq: Two times a day (BID) | ORAL | 1 refills | Status: DC | PRN
  Filled 2022-11-01: qty 60, 30d supply, fill #0

## 2022-11-04 ENCOUNTER — Other Ambulatory Visit: Payer: Self-pay

## 2022-11-06 ENCOUNTER — Other Ambulatory Visit: Payer: Self-pay

## 2022-12-04 ENCOUNTER — Encounter: Payer: Self-pay | Admitting: Physician Assistant

## 2022-12-04 DIAGNOSIS — E119 Type 2 diabetes mellitus without complications: Secondary | ICD-10-CM

## 2022-12-04 MED ORDER — MOUNJARO 10 MG/0.5ML ~~LOC~~ SOAJ: 10.0000 mg | SUBCUTANEOUS | 1 refills | Status: DC

## 2022-12-06 ENCOUNTER — Ambulatory Visit (INDEPENDENT_AMBULATORY_CARE_PROVIDER_SITE_OTHER): Payer: Managed Care, Other (non HMO) | Admitting: Physician Assistant

## 2022-12-06 ENCOUNTER — Encounter: Payer: Self-pay | Admitting: Physician Assistant

## 2022-12-06 ENCOUNTER — Other Ambulatory Visit: Payer: Self-pay | Admitting: Physician Assistant

## 2022-12-06 VITALS — BP 126/86 | HR 74 | Temp 98.0°F | Ht 67.0 in | Wt 288.0 lb

## 2022-12-06 DIAGNOSIS — E785 Hyperlipidemia, unspecified: Secondary | ICD-10-CM

## 2022-12-06 DIAGNOSIS — F339 Major depressive disorder, recurrent, unspecified: Secondary | ICD-10-CM | POA: Diagnosis not present

## 2022-12-06 DIAGNOSIS — E119 Type 2 diabetes mellitus without complications: Secondary | ICD-10-CM

## 2022-12-06 DIAGNOSIS — E1169 Type 2 diabetes mellitus with other specified complication: Secondary | ICD-10-CM

## 2022-12-06 DIAGNOSIS — Z7985 Long-term (current) use of injectable non-insulin antidiabetic drugs: Secondary | ICD-10-CM

## 2022-12-06 DIAGNOSIS — M2550 Pain in unspecified joint: Secondary | ICD-10-CM | POA: Diagnosis not present

## 2022-12-06 DIAGNOSIS — Z0000 Encounter for general adult medical examination without abnormal findings: Secondary | ICD-10-CM

## 2022-12-06 DIAGNOSIS — Z6841 Body Mass Index (BMI) 40.0 and over, adult: Secondary | ICD-10-CM

## 2022-12-06 LAB — MICROALBUMIN / CREATININE URINE RATIO
Creatinine,U: 110.3 mg/dL
Microalb Creat Ratio: 3.7 mg/g (ref 0.0–30.0)
Microalb, Ur: 4.1 mg/dL — ABNORMAL HIGH (ref 0.0–1.9)

## 2022-12-06 LAB — CBC WITH DIFFERENTIAL/PLATELET
Basophils Absolute: 0.1 10*3/uL (ref 0.0–0.1)
Basophils Relative: 0.8 % (ref 0.0–3.0)
Eosinophils Absolute: 0.2 10*3/uL (ref 0.0–0.7)
Eosinophils Relative: 1.6 % (ref 0.0–5.0)
HCT: 43.1 % (ref 36.0–46.0)
Hemoglobin: 13.4 g/dL (ref 12.0–15.0)
Lymphocytes Relative: 28.1 % (ref 12.0–46.0)
Lymphs Abs: 2.7 10*3/uL (ref 0.7–4.0)
MCHC: 31.1 g/dL (ref 30.0–36.0)
MCV: 81.3 fL (ref 78.0–100.0)
Monocytes Absolute: 0.5 10*3/uL (ref 0.1–1.0)
Monocytes Relative: 5.3 % (ref 3.0–12.0)
Neutro Abs: 6.2 10*3/uL (ref 1.4–7.7)
Neutrophils Relative %: 64.2 % (ref 43.0–77.0)
Platelets: 300 10*3/uL (ref 150.0–400.0)
RBC: 5.31 Mil/uL — ABNORMAL HIGH (ref 3.87–5.11)
RDW: 15.2 % (ref 11.5–15.5)
WBC: 9.7 10*3/uL (ref 4.0–10.5)

## 2022-12-06 LAB — COMPREHENSIVE METABOLIC PANEL
ALT: 17 U/L (ref 0–35)
AST: 15 U/L (ref 0–37)
Albumin: 4.2 g/dL (ref 3.5–5.2)
Alkaline Phosphatase: 76 U/L (ref 39–117)
BUN: 14 mg/dL (ref 6–23)
CO2: 25 meq/L (ref 19–32)
Calcium: 9.4 mg/dL (ref 8.4–10.5)
Chloride: 102 meq/L (ref 96–112)
Creatinine, Ser: 0.74 mg/dL (ref 0.40–1.20)
GFR: 94.71 mL/min (ref >60.00)
Glucose, Bld: 88 mg/dL (ref 70–99)
Potassium: 4.3 meq/L (ref 3.5–5.1)
Sodium: 138 meq/L (ref 135–145)
Total Bilirubin: 0.5 mg/dL (ref 0.2–1.2)
Total Protein: 7 g/dL (ref 6.0–8.3)

## 2022-12-06 LAB — LIPID PANEL
Cholesterol: 147 mg/dL (ref 0–200)
HDL: 47.7 mg/dL (ref >39.00)
LDL Cholesterol: 75 mg/dL (ref 0–99)
NonHDL: 99.45
Total CHOL/HDL Ratio: 3
Triglycerides: 123 mg/dL (ref 0.0–149.0)
VLDL: 24.6 mg/dL (ref 0.0–40.0)

## 2022-12-06 LAB — T4, FREE: Free T4: 0.74 ng/dL (ref 0.60–1.60)

## 2022-12-06 LAB — TSH: TSH: 3.11 u[IU]/mL (ref 0.35–5.50)

## 2022-12-06 LAB — HEMOGLOBIN A1C: Hgb A1c MFr Bld: 6.3 % (ref 4.6–6.5)

## 2022-12-06 LAB — C-REACTIVE PROTEIN: CRP: <1 mg/dL (ref 0.5–20.0)

## 2022-12-06 LAB — SEDIMENTATION RATE: Sed Rate: 14 mm/h (ref 0–20)

## 2022-12-06 MED ORDER — TIRZEPATIDE 12.5 MG/0.5ML ~~LOC~~ SOAJ: 12.5000 mg | SUBCUTANEOUS | 1 refills | Status: DC

## 2022-12-06 NOTE — Assessment & Plan Note (Signed)
Managed on atorvastatin 20 mg  Ldl goal < 70  Ordered fasting lipids

## 2022-12-06 NOTE — Assessment & Plan Note (Signed)
Stable, with episodes of increased anxiety/trauma response Discussed coping techniques and as always pushed for therapy

## 2022-12-06 NOTE — Progress Notes (Signed)
Complete physical exam   Patient: Leslie Morris   DOB: 18-Jan-1971   52 y.o. Female  MRN: [id number] Visit Date: 12/06/2022  Today's healthcare provider: [name], PA-C   Chief Complaint  Patient presents with   Annual Exam    Patient states that she does have some stress with work. She also has noticed some joint pain and swelling. Patient is fasting.   Subjective    Leslie Morris is a 52 y.o. female who presents today for a complete physical exam.   She reports feeling like her weight has plateaued, that her appetite is growing despite her mounjaro.    Past Medical History:  Diagnosis Date   Anxiety    Arthritis    left ankle, wrist, hand   BMI 50.0-59.9, adult (HCC)    Carpal tunnel syndrome on left    Family history of adverse reaction to anesthesia    Dad is slow to wake up and n/v   History of kidney stones    Left ankle tendonitis    Medullary sponge kidney    bilateral   PONV (postoperative nausea and vomiting)    severe   Right knee meniscal tear    Type 2 diabetes mellitus (HCC)    followed by pcp--- last A1c 6.8 on 09-25-2016   Urinary incontinence    due to Medullary sponge kidney   Vitamin D deficiency    Past Surgical History:  Procedure Laterality Date   ABDOMINAL HYSTERECTOMY  2003  approx.   ovaries remained    APPENDECTOMY     CARPAL TUNNEL RELEASE Left 10/24/2016   Procedure: CARPAL TUNNEL RELEASE;  Surgeon: Bradly Bienenstock, MD;  Location: [facility] OR;  Service: Orthopedics;  Laterality: Left;   CHONDROPLASTY Right 10/21/2018   Procedure: CHONDROPLASTY;  Surgeon: Donato Heinz, MD;  Location: [facility] ORS;  Service: Orthopedics;  Laterality: Right;   COLONOSCOPY  2003 approx.   COLONOSCOPY WITH PROPOFOL N/A 07/02/2021   Procedure: COLONOSCOPY WITH PROPOFOL;  Surgeon: Wyline Mood, MD;  Location: [facility] ENDOSCOPY;  Service: Gastroenterology;  Laterality: N/A;   CYSTOSCOPY MACROPLASTIQUE IMPLANT  10-05-2012    [facility]   HERNIA REPAIR     umbilical   KNEE  ARTHROSCOPY Right 10/21/2018   Procedure: ARTHROSCOPY KNEE, ;  Surgeon: Donato Heinz, MD;  Location: [facility] ORS;  Service: Orthopedics;  Laterality: Right;   LAPAROSCOPIC APPENDECTOMY  12/04/2010   [facility]   and Umbilical Hernia Repair   LAPAROSCOPIC CHOLECYSTECTOMY  10-10-2009   [facility]   MENISECTOMY Right 10/21/2018   Procedure: PARTIAL LATERAL MENISECTOMY;  Surgeon: Donato Heinz, MD;  Location: [facility] ORS;  Service: Orthopedics;  Laterality: Right;   ORIF LEFT ANKLE COMPLEX FRACTURES  age 40   same year hardware removed   TONSILLECTOMY  child   ULNAR COLLATERAL LIGAMENT REPAIR Left 03/21/2016   Procedure: Left thumb ulnar collateral ligament repair;  Surgeon: Bradly Bienenstock, MD;  Location: [facility] OR;  Service: Orthopedics;  Laterality: Left;   URETEROLITHOTOMY  2005   Social History   Socioeconomic History   Marital status: Single    Spouse name: Not on file   Number of children: 2   Years of education: 14   Highest education level: 12th grade  Occupational History   Occupation: [employer]  Tobacco Use   Smoking status: Former    Current packs/day: 0.00    Average packs/day: 0.5 packs/day for 15.0 years (7.5 ttl pk-yrs)    Types: Cigarettes    Start date:  06/14/2006    Quit date: 06/13/2021    Years since quitting: 1.4   Smokeless tobacco: Never  Vaping Use   Vaping status: Never Used  Substance and Sexual Activity   Alcohol use: No    Alcohol/week: 0.0 standard drinks of alcohol   Drug use: No   Sexual activity: Not on file  Other Topics Concern   Not on file  Social History Narrative   Not on file   Social Determinants of Health   Financial Resource Strain: Low Risk  (10/10/2022)   Overall Financial Resource Strain (CARDIA)    Difficulty of Paying Living Expenses: Not very hard  Food Insecurity: No Food Insecurity (10/10/2022)   Hunger Vital Sign    Worried About Running Out of Food in the Last Year: Never true    Ran Out of Food in the Last Year: Never true   Transportation Needs: No Transportation Needs (10/10/2022)   PRAPARE - Administrator, Civil Service (Medical): No    Lack of Transportation (Non-Medical): No  Physical Activity: Sufficiently Active (10/10/2022)   Exercise Vital Sign    Days of Exercise per Week: 4 days    Minutes of Exercise per Session: 60 min  Stress: Stress Concern Present (10/10/2022)   [employer] of Occupational Health - Occupational Stress Questionnaire    Feeling of Stress : To some extent  Social Connections: Socially Isolated (10/10/2022)   Social Connection and Isolation Panel [NHANES]    Frequency of Communication with Friends and Family: More than three times a week    Frequency of Social Gatherings with Friends and Family: More than three times a week    Attends Religious Services: Never    Database administrator or Organizations: No    Attends Engineer, structural: Not on file    Marital Status: Divorced  Catering manager Violence: Not on file   Family Status  Relation Name Status   Mother  Alive   Father  Alive   Mat Aunt  (Not Specified)   Oceanographer  (Not Specified)   Neg Hx  (Not Specified)  No partnership data on file   Family History  Problem Relation Age of Onset   Endometriosis Mother    Diabetes Father    Heart attack Father    Cancer Maternal Aunt        cervical cancer   Cancer Paternal Aunt        breast cancer   Heart disease Neg Hx    Hyperlipidemia Neg Hx    Hypertension Neg Hx    Stroke Neg Hx    Allergies  Allergen Reactions   Levaquin [Levofloxacin In D5w] Anaphylaxis and Rash   Macrobid [Nitrofurantoin] Rash    Patient Care Team: [name], PA-C as PCP - General (Physician Assistant) Pa, [facility] (Optometry)   Medications: Outpatient Medications Prior to Visit  Medication Sig   aspirin EC 81 MG tablet Take 81 mg by mouth daily.   atorvastatin (LIPITOR) 20 MG tablet Take 1 tablet (20 mg total) by mouth daily.   buPROPion  (WELLBUTRIN XL) 300 MG 24 hr tablet Take 1 tablet (300 mg total) by mouth daily.   celecoxib (CELEBREX) 100 MG capsule Take 1 capsule (100 mg total) by mouth 2 (two) times daily as needed for moderate pain.   cyclobenzaprine (FLEXERIL) 5 MG tablet Take 1 tablet (5 mg total) by mouth at bedtime as needed for muscle spasms.   fluticasone (FLONASE) 50  MCG/ACT nasal spray Place 2 sprays into both nostrils daily.   glucose blood (ONETOUCH VERIO) test strip USE AS DIRECTED   hydrOXYzine (VISTARIL) 50 MG capsule Take 1 capsule (50 mg total) by mouth at bedtime as needed.   ipratropium (ATROVENT) 0.06 % nasal spray Place 2-4 sprays into each nostril 4 (four) times daily.   JARDIANCE 25 MG TABS tablet TAKE 1 TABLET (25 MG TOTAL) BY MOUTH DAILY.   lisinopril (ZESTRIL) 5 MG tablet Take 1 tablet (5 mg total) by mouth daily.   metFORMIN (GLUCOPHAGE) 1000 MG tablet Take 1 tablet (1,000 mg total) by mouth 2 (two) times daily with food.Letta Pate Delica Lancets 33G MISC USE AS DIRECTED 3 TIMES A DAY   oxybutynin (DITROPAN) 5 MG tablet Take 1 tablet (5 mg total) by mouth 3 (three) times daily.   promethazine (PHENERGAN) 12.5 MG tablet Take 1 tablet (12.5 mg total) by mouth every 8 (eight) hours as needed FOR NAUSEA AND VOMITING.   pseudoephedrine (SUDAFED) 120 MG 12 hr tablet Take 1 tablet (120 mg total) by mouth every 12 (twelve) hours for 7 days as needed   tirzepatide (MOUNJARO) 10 MG/0.5ML Pen Inject 10 mg into the skin once a week.   No facility-administered medications prior to visit.    Review of Systems  Constitutional:  Negative for fatigue and fever.  HENT:  Negative for congestion.   Eyes:  Negative for visual disturbance.  Respiratory:  Negative for cough and shortness of breath.   Cardiovascular:  Negative for chest pain and leg swelling.  Gastrointestinal:  Negative for abdominal pain.  Genitourinary:  Negative for dysuria.  Musculoskeletal:  Positive for arthralgias.  Neurological:   Negative for dizziness and headaches.  Psychiatric/Behavioral:  Negative for suicidal ideas. The patient is nervous/anxious.     Objective    BP 126/86   Pulse 74   Temp 98 F (36.7 C) (Oral)   Ht 5\' 7"  (1.702 m)   Wt 288 lb (130.6 kg)   LMP 11/30/1998 (Approximate) Comment: Hysterectomy at age 69 yo  SpO2 96%   BMI 45.11 kg/m   Physical Exam Constitutional:      General: She is awake.     Appearance: She is well-developed. She is not ill-appearing.  HENT:     Head: Normocephalic.     Right Ear: Tympanic membrane normal.     Left Ear: Tympanic membrane normal.     Nose: Nose normal. No congestion or rhinorrhea.     Mouth/Throat:     Pharynx: No oropharyngeal exudate or posterior oropharyngeal erythema.  Eyes:     Conjunctiva/sclera: Conjunctivae normal.     Pupils: Pupils are equal, round, and reactive to light.  Neck:     Thyroid: No thyroid mass or thyromegaly.  Cardiovascular:     Rate and Rhythm: Normal rate and regular rhythm.     Pulses:          Dorsalis pedis pulses are 2+ on the right side and 2+ on the left side.       Posterior tibial pulses are 2+ on the right side and 2+ on the left side.     Heart sounds: Normal heart sounds.  Pulmonary:     Effort: Pulmonary effort is normal.     Breath sounds: Normal breath sounds.  Abdominal:     Palpations: Abdomen is soft.     Tenderness: There is no abdominal tenderness.  Musculoskeletal:     Right lower leg: No swelling.  No edema.     Left lower leg: No swelling. No edema.  Feet:     Right foot:     Protective Sensation: 4 sites tested.  4 sites sensed.     Skin integrity: Skin integrity normal.     Left foot:     Protective Sensation: 4 sites tested.  4 sites sensed.     Skin integrity: Skin integrity normal.     Comments: Slightly diminished sensation to right plantar big toe, but h/o surgery on this foot. Otherwise all sensation equal/intact Lymphadenopathy:     Cervical: No cervical adenopathy.   Skin:    General: Skin is warm.  Neurological:     Mental Status: She is alert and oriented to person, place, and time.  Psychiatric:        Attention and Perception: Attention normal.        Mood and Affect: Mood normal.        Speech: Speech normal.        Behavior: Behavior normal. Behavior is cooperative.      Last depression screening scores    08/02/2022    8:32 AM 07/04/2022    9:12 AM 01/11/2022    3:09 PM  PHQ 2/9 Scores  PHQ - 2 Score 2 2 3   PHQ- 9 Score 7 7 8    Last fall risk screening    08/02/2022    8:32 AM  Fall Risk   Falls in the past year? 0  Number falls in past yr: 0  Injury with Fall? 0  Risk for fall due to : No Fall Risks  Follow up Falls evaluation completed   Last Audit-C alcohol use screening    10/10/2022    8:53 AM  Alcohol Use Disorder Test (AUDIT)  1. How often do you have a drink containing alcohol? 0  3. How often do you have six or more drinks on one occasion? 0   A score of 3 or more in women, and 4 or more in men indicates increased risk for alcohol abuse, EXCEPT if all of the points are from question 1   No results found for any visits on 12/06/22.  Assessment & Plan    Routine Health Maintenance and Physical Exam  Exercise Activities and Dietary recommendations   --balanced diet high in fiber and protein, low in sugars, carbs, fats. --physical activity/exercise 20-30 minutes 3-5 times a week    Immunization History  Administered Date(s) Administered   Hepatitis B 10/13/2007, 12/01/2007, 03/15/2015   Influenza,inj,Quad PF,6+ Mos 11/13/2021   Influenza-Unspecified 11/20/2020, 11/20/2022   PFIZER(Purple Top)SARS-COV-2 Vaccination 05/19/2019, 06/09/2019   PNEUMOCOCCAL CONJUGATE-20 11/30/2020   Td 10/20/2017   Tdap 10/13/2007    Health Maintenance  Topic Date Due   MAMMOGRAM  04/17/2022   Diabetic kidney evaluation - Urine ACR  09/08/2022   COVID-19 Vaccine (3 - 2023-24 season) 10/13/2022   HEMOGLOBIN A1C  02/01/2023    Diabetic kidney evaluation - eGFR measurement  06/11/2023   OPHTHALMOLOGY EXAM  07/01/2023   FOOT EXAM  12/06/2023   DTaP/Tdap/Td (3 - Td or Tdap) 10/21/2027   Colonoscopy  07/03/2031   INFLUENZA VACCINE  Completed   Hepatitis C Screening  Completed   HIV Screening  Completed   HPV VACCINES  Aged Out    Discussed health benefits of physical activity, and encouraged her to engage in regular exercise appropriate for her age and condition.  Problem List Items Addressed This Visit  Endocrine   Hyperlipidemia associated with type 2 diabetes mellitus (HCC)    Managed on atorvastatin 20 mg  Ldl goal < 70  Ordered fasting lipids       Relevant Medications   tirzepatide (MOUNJARO) 12.5 MG/0.5ML Pen   Other Relevant Orders   Lipid panel   Well controlled type 2 diabetes mellitus without evidence of diabetic end organ manifestation (HCC)    Increasing mounjaro to 12.5 mg ; ordered a1c Uacr ordered Pt on statin, ace Utd on vaccines Foot exam completed today Utd on optho F/u 6 mo      Relevant Medications   tirzepatide (MOUNJARO) 12.5 MG/0.5ML Pen   Other Relevant Orders   CBC w/Diff   Comp Met (CMET)   HgB A1c   Urine Microalbumin w/creat. ratio     Other   Morbid obesity (HCC)    Weight has plateaued w/ mounjaro 10 mg  Increasing to 12.5 mg  Cautioned of initial n/v, reflux  Continued discussion of diet/exercise       Relevant Medications   tirzepatide (MOUNJARO) 12.5 MG/0.5ML Pen   Recurrent depression (HCC)    Stable, with episodes of increased anxiety/trauma response Discussed coping techniques and as always pushed for therapy       Multiple joint pain    Ongoing, now seems more widespread. Pt has injury to L wrist and ankle, but pain is now more generalized. No history of autoimmune w/u ordering today Otherwise pt manages with celebrex bid      Relevant Orders   Rheumatoid Factor   Sedimentation rate   C-reactive protein   ANA w/Reflex if  Positive   TSH   T4, free   Other Visit Diagnoses     Annual physical exam    -  Primary   Relevant Orders   CBC w/Diff   Comp Met (CMET)      Reminded about mammogram appointment  Return in about 6 months (around 06/06/2023) for DMII.     [name], PA-C  [facility] at [facility] [phone] (phone) [phone] (fax)  [facility]

## 2022-12-06 NOTE — Assessment & Plan Note (Addendum)
Weight has plateaued w/ mounjaro 10 mg  Increasing to 12.5 mg  Cautioned of initial n/v, reflux  Continued discussion of diet/exercise

## 2022-12-06 NOTE — Assessment & Plan Note (Signed)
Ongoing, now seems more widespread. Pt has injury to L wrist and ankle, but pain is now more generalized. No history of autoimmune w/u ordering today Otherwise pt manages with celebrex bid

## 2022-12-06 NOTE — Assessment & Plan Note (Signed)
Increasing mounjaro to 12.5 mg ; ordered a1c Uacr ordered Pt on statin, ace Utd on vaccines Foot exam completed today Utd on optho F/u 6 mo

## 2022-12-06 NOTE — Patient Instructions (Signed)
Mammogram:  [phone]

## 2022-12-07 LAB — RHEUMATOID FACTOR: Rheumatoid fact SerPl-aCnc: <10 [IU]/mL (ref <14)

## 2022-12-09 ENCOUNTER — Telehealth: Payer: Self-pay

## 2022-12-09 ENCOUNTER — Other Ambulatory Visit: Payer: Self-pay

## 2022-12-09 ENCOUNTER — Encounter: Payer: Self-pay | Admitting: Physician Assistant

## 2022-12-09 ENCOUNTER — Other Ambulatory Visit (HOSPITAL_COMMUNITY): Payer: Self-pay

## 2022-12-09 DIAGNOSIS — E119 Type 2 diabetes mellitus without complications: Secondary | ICD-10-CM

## 2022-12-09 DIAGNOSIS — E1165 Type 2 diabetes mellitus with hyperglycemia: Secondary | ICD-10-CM

## 2022-12-09 DIAGNOSIS — G8929 Other chronic pain: Secondary | ICD-10-CM

## 2022-12-09 DIAGNOSIS — R809 Proteinuria, unspecified: Secondary | ICD-10-CM

## 2022-12-09 MED ORDER — CELECOXIB 100 MG PO CAPS: 100.0000 mg | ORAL_CAPSULE | Freq: Two times a day (BID) | ORAL | 1 refills | Status: DC | PRN

## 2022-12-09 MED ORDER — EMPAGLIFLOZIN 25 MG PO TABS: 25.0000 mg | ORAL_TABLET | Freq: Every day | ORAL | 1 refills | Status: DC

## 2022-12-09 MED ORDER — LISINOPRIL 5 MG PO TABS
5.0000 mg | ORAL_TABLET | Freq: Every day | ORAL | 3 refills | Status: DC
  Filled 2023-08-31: qty 90, 90d supply, fill #0

## 2022-12-09 NOTE — Telephone Encounter (Signed)
Pharmacy Patient Advocate Encounter  Received notification from [employer] that Prior Authorization for [facility] 12.5MG /0.5ML auto-injectors  has been APPROVED from 12/09/2022 to 12/08/2023. Ran test claim, Copay is $refill too soon due to 10mg  fill. This test claim was processed through [facility]- copay amounts may vary at other pharmacies due to pharmacy/plan contracts, or as the patient moves through the different stages of their insurance plan.

## 2022-12-09 NOTE — Telephone Encounter (Signed)
*  Primary  Pharmacy Patient Advocate Encounter   Received notification from RX Request Messages that prior authorization for Mounjaro 12.5MG /0.5ML auto-injectors  is required/requested.   Insurance verification completed.   The patient is insured through [employer] .   Per test claim: PA required; PA submitted to [employer] via CoverMyMeds Key/confirmation #/EOC [id number] Status is pending

## 2022-12-09 NOTE — Telephone Encounter (Signed)
PA request has been Submitted. New Encounter created for follow up. For additional info see Pharmacy Prior Auth telephone encounter from 10/28.

## 2022-12-24 ENCOUNTER — Other Ambulatory Visit: Payer: Self-pay

## 2022-12-24 ENCOUNTER — Encounter: Payer: Self-pay | Admitting: Physician Assistant

## 2022-12-24 ENCOUNTER — Other Ambulatory Visit: Payer: Self-pay | Admitting: Physician Assistant

## 2022-12-24 DIAGNOSIS — R110 Nausea: Secondary | ICD-10-CM

## 2022-12-24 MED ORDER — ONDANSETRON HCL 4 MG PO TABS
4.0000 mg | ORAL_TABLET | Freq: Three times a day (TID) | ORAL | 1 refills | Status: DC | PRN
  Filled 2022-12-24: qty 60, 20d supply, fill #0

## 2022-12-25 ENCOUNTER — Other Ambulatory Visit: Payer: Self-pay

## 2022-12-25 DIAGNOSIS — R110 Nausea: Secondary | ICD-10-CM

## 2022-12-25 MED ORDER — ONDANSETRON HCL 4 MG PO TABS: 4.0000 mg | ORAL_TABLET | Freq: Three times a day (TID) | ORAL | 1 refills | Status: DC | PRN

## 2023-01-06 ENCOUNTER — Other Ambulatory Visit: Payer: Self-pay | Admitting: Physician Assistant

## 2023-01-06 DIAGNOSIS — F339 Major depressive disorder, recurrent, unspecified: Secondary | ICD-10-CM

## 2023-01-26 ENCOUNTER — Encounter: Payer: Self-pay | Admitting: Physician Assistant

## 2023-01-27 ENCOUNTER — Other Ambulatory Visit: Payer: Self-pay

## 2023-01-27 DIAGNOSIS — E1169 Type 2 diabetes mellitus with other specified complication: Secondary | ICD-10-CM

## 2023-01-27 MED ORDER — ATORVASTATIN CALCIUM 20 MG PO TABS
20.0000 mg | ORAL_TABLET | Freq: Every day | ORAL | 3 refills | Status: DC
  Filled 2023-07-16 – 2023-08-04 (×2): qty 90, 90d supply, fill #0
  Filled 2023-10-23: qty 90, 90d supply, fill #1

## 2023-02-01 ENCOUNTER — Other Ambulatory Visit: Payer: Self-pay | Admitting: Physician Assistant

## 2023-02-01 DIAGNOSIS — G8929 Other chronic pain: Secondary | ICD-10-CM

## 2023-02-03 ENCOUNTER — Other Ambulatory Visit: Payer: Self-pay

## 2023-02-03 DIAGNOSIS — E1165 Type 2 diabetes mellitus with hyperglycemia: Secondary | ICD-10-CM

## 2023-02-03 MED ORDER — METFORMIN HCL 1000 MG PO TABS
1000.0000 mg | ORAL_TABLET | Freq: Two times a day (BID) | ORAL | 3 refills | Status: DC
  Filled 2023-07-16 – 2023-08-04 (×2): qty 180, 90d supply, fill #0
  Filled 2023-11-01: qty 180, 90d supply, fill #1

## 2023-03-03 ENCOUNTER — Encounter: Payer: Self-pay | Admitting: Physician Assistant

## 2023-03-03 ENCOUNTER — Other Ambulatory Visit: Payer: Self-pay

## 2023-03-03 DIAGNOSIS — E119 Type 2 diabetes mellitus without complications: Secondary | ICD-10-CM

## 2023-03-03 MED ORDER — TIRZEPATIDE 12.5 MG/0.5ML ~~LOC~~ SOAJ: 12.5000 mg | SUBCUTANEOUS | 1 refills | Status: DC

## 2023-04-09 ENCOUNTER — Other Ambulatory Visit: Payer: Self-pay | Admitting: Physician Assistant

## 2023-04-09 DIAGNOSIS — G8929 Other chronic pain: Secondary | ICD-10-CM

## 2023-04-28 ENCOUNTER — Encounter: Payer: Self-pay | Admitting: Physician Assistant

## 2023-04-29 ENCOUNTER — Other Ambulatory Visit: Payer: Self-pay | Admitting: Physician Assistant

## 2023-04-29 ENCOUNTER — Other Ambulatory Visit: Payer: Self-pay

## 2023-04-29 DIAGNOSIS — E119 Type 2 diabetes mellitus without complications: Secondary | ICD-10-CM

## 2023-04-29 MED ORDER — EMPAGLIFLOZIN 10 MG PO TABS
10.0000 mg | ORAL_TABLET | Freq: Every day | ORAL | 2 refills | Status: DC
  Filled 2023-04-29 – 2023-08-04 (×2): qty 90, 90d supply, fill #0
  Filled 2023-11-01: qty 90, 90d supply, fill #1
  Filled 2024-01-28: qty 90, 90d supply, fill #2

## 2023-05-09 ENCOUNTER — Other Ambulatory Visit: Payer: Self-pay

## 2023-05-12 ENCOUNTER — Other Ambulatory Visit: Payer: Self-pay | Admitting: Physician Assistant

## 2023-05-12 ENCOUNTER — Other Ambulatory Visit: Payer: Self-pay

## 2023-05-12 DIAGNOSIS — F339 Major depressive disorder, recurrent, unspecified: Secondary | ICD-10-CM

## 2023-05-12 MED ORDER — HYDROXYZINE PAMOATE 50 MG PO CAPS
50.0000 mg | ORAL_CAPSULE | Freq: Every evening | ORAL | 1 refills | Status: DC | PRN
  Filled 2023-05-12: qty 90, 90d supply, fill #0

## 2023-05-13 ENCOUNTER — Other Ambulatory Visit: Payer: Self-pay | Admitting: Physician Assistant

## 2023-05-13 DIAGNOSIS — F339 Major depressive disorder, recurrent, unspecified: Secondary | ICD-10-CM

## 2023-05-13 MED ORDER — HYDROXYZINE PAMOATE 50 MG PO CAPS
50.0000 mg | ORAL_CAPSULE | Freq: Every evening | ORAL | 1 refills | Status: AC | PRN
  Filled 2023-07-16 – 2023-08-11 (×2): qty 90, 90d supply, fill #0
  Filled 2024-02-07 – 2024-02-08 (×2): qty 90, 90d supply, fill #1

## 2023-05-30 ENCOUNTER — Encounter: Payer: Self-pay | Admitting: Physician Assistant

## 2023-06-02 ENCOUNTER — Other Ambulatory Visit: Payer: Self-pay

## 2023-06-02 ENCOUNTER — Other Ambulatory Visit: Payer: Self-pay | Admitting: *Deleted

## 2023-06-02 DIAGNOSIS — E119 Type 2 diabetes mellitus without complications: Secondary | ICD-10-CM

## 2023-06-02 MED ORDER — TIRZEPATIDE 12.5 MG/0.5ML ~~LOC~~ SOAJ
12.5000 mg | SUBCUTANEOUS | 1 refills | Status: DC
  Filled 2023-06-02: qty 6, 84d supply, fill #0
  Filled 2023-08-11: qty 6, 84d supply, fill #1

## 2023-06-08 ENCOUNTER — Other Ambulatory Visit: Payer: Self-pay | Admitting: Physician Assistant

## 2023-06-08 DIAGNOSIS — G8929 Other chronic pain: Secondary | ICD-10-CM

## 2023-07-03 ENCOUNTER — Encounter: Payer: Self-pay | Admitting: Physician Assistant

## 2023-07-04 ENCOUNTER — Other Ambulatory Visit: Payer: Self-pay | Admitting: Medical Genetics

## 2023-07-11 ENCOUNTER — Other Ambulatory Visit
Admission: RE | Admit: 2023-07-11 | Discharge: 2023-07-11 | Disposition: A | Discharge status: Home / Self Care | Payer: Self-pay | Source: Ambulatory Visit | Attending: Medical Genetics | Admitting: Medical Genetics

## 2023-07-11 ENCOUNTER — Ambulatory Visit: Payer: Self-pay | Admitting: Physician Assistant

## 2023-07-11 ENCOUNTER — Ambulatory Visit: Admitting: Physician Assistant

## 2023-07-11 ENCOUNTER — Other Ambulatory Visit: Payer: Self-pay

## 2023-07-11 ENCOUNTER — Other Ambulatory Visit: Payer: Self-pay | Admitting: Physician Assistant

## 2023-07-11 VITALS — BP 121/82 | HR 65 | Ht 67.0 in | Wt 303.4 lb

## 2023-07-11 DIAGNOSIS — E559 Vitamin D deficiency, unspecified: Secondary | ICD-10-CM

## 2023-07-11 DIAGNOSIS — E538 Deficiency of other specified B group vitamins: Secondary | ICD-10-CM

## 2023-07-11 DIAGNOSIS — R5383 Other fatigue: Secondary | ICD-10-CM

## 2023-07-11 DIAGNOSIS — N6322 Unspecified lump in the left breast, upper inner quadrant: Secondary | ICD-10-CM | POA: Diagnosis not present

## 2023-07-11 DIAGNOSIS — E119 Type 2 diabetes mellitus without complications: Secondary | ICD-10-CM

## 2023-07-11 LAB — CBC WITH DIFFERENTIAL/PLATELET
Basophils Absolute: 0.1 10*3/uL (ref 0.0–0.1)
Basophils Relative: 0.7 % (ref 0.0–3.0)
Eosinophils Absolute: 0.1 10*3/uL (ref 0.0–0.7)
Eosinophils Relative: 1.6 % (ref 0.0–5.0)
HCT: 40.5 % (ref 36.0–46.0)
Hemoglobin: 13 g/dL (ref 12.0–15.0)
Lymphocytes Relative: 28.1 % (ref 12.0–46.0)
Lymphs Abs: 2.3 10*3/uL (ref 0.7–4.0)
MCHC: 32.1 g/dL (ref 30.0–36.0)
MCV: 79.9 fl (ref 78.0–100.0)
Monocytes Absolute: 0.4 10*3/uL (ref 0.1–1.0)
Monocytes Relative: 5.3 % (ref 3.0–12.0)
Neutro Abs: 5.3 10*3/uL (ref 1.4–7.7)
Neutrophils Relative %: 64.3 % (ref 43.0–77.0)
Platelets: 261 10*3/uL (ref 150.0–400.0)
RBC: 5.07 Mil/uL (ref 3.87–5.11)
RDW: 15.2 % (ref 11.5–15.5)
WBC: 8.2 10*3/uL (ref 4.0–10.5)

## 2023-07-11 LAB — COMPREHENSIVE METABOLIC PANEL WITH GFR
ALT: 18 U/L (ref 0–35)
AST: 16 U/L (ref 0–37)
Albumin: 4.3 g/dL (ref 3.5–5.2)
Alkaline Phosphatase: 74 U/L (ref 39–117)
BUN: 13 mg/dL (ref 6–23)
CO2: 26 meq/L (ref 19–32)
Calcium: 9.1 mg/dL (ref 8.4–10.5)
Chloride: 103 meq/L (ref 96–112)
Creatinine, Ser: 0.79 mg/dL (ref 0.40–1.20)
GFR: 87.2 mL/min (ref >60.00)
Glucose, Bld: 90 mg/dL (ref 70–99)
Potassium: 4.8 meq/L (ref 3.5–5.1)
Sodium: 140 meq/L (ref 135–145)
Total Bilirubin: 0.6 mg/dL (ref 0.2–1.2)
Total Protein: 6.9 g/dL (ref 6.0–8.3)

## 2023-07-11 LAB — TSH: TSH: 3.64 u[IU]/mL (ref 0.35–5.50)

## 2023-07-11 LAB — VITAMIN B12: Vitamin B-12: 98 pg/mL — ABNORMAL LOW (ref 211–911)

## 2023-07-11 LAB — VITAMIN D 25 HYDROXY (VIT D DEFICIENCY, FRACTURES): VITD: <7 ng/mL — ABNORMAL LOW (ref 30.00–100.00)

## 2023-07-11 LAB — HEMOGLOBIN A1C: Hgb A1c MFr Bld: 6.5 % (ref 4.6–6.5)

## 2023-07-11 MED ORDER — VITAMIN B-12 1000 MCG PO TABS
1000.0000 ug | ORAL_TABLET | Freq: Every day | ORAL | 2 refills | Status: AC
  Filled 2023-07-11: qty 90, 90d supply, fill #0
  Filled 2023-10-09: qty 90, 90d supply, fill #1
  Filled 2024-01-23: qty 90, 90d supply, fill #2

## 2023-07-11 MED ORDER — VITAMIN D (ERGOCALCIFEROL) 1.25 MG (50000 UNIT) PO CAPS
50000.0000 [IU] | ORAL_CAPSULE | ORAL | 1 refills | Status: DC
  Filled 2023-07-11: qty 12, 84d supply, fill #0
  Filled 2023-09-24 (×2): qty 12, 84d supply, fill #1

## 2023-07-11 NOTE — Progress Notes (Signed)
 Established patient visit   Patient: Leslie Morris   DOB: February 24, 1970   53 y.o. Female  MRN: [id number] Visit Date: 07/11/2023  Today's healthcare provider: Trenton Frock, PA-C   Cc. Left breast lump, fatigue  Subjective     Discussed the use of AI scribe software for clinical note transcription with the patient, who gave verbal consent to proceed.  History of Present Illness   Leslie Morris "Leslie Morris" is a 53 year old female who presents with a new palpable mass in the left breast.  Approximately three weeks ago, she discovered a mass in her left breast while showering. The mass feels like solid marble, is smooth, and slightly mobile. There is no associated pain, and the mass has remained unchanged since its discovery. Her last mammogram was in 2023, and she has not kept up with regular screenings.  She experiences extreme fatigue and exhaustion without an apparent cause, despite normal sleep patterns. Her energy levels fluctuate significantly. COVID tests were negative at home, although no URI symptoms.       Medications: Outpatient Medications Prior to Visit  Medication Sig   aspirin EC 81 MG tablet Take 81 mg by mouth daily.   atorvastatin  (LIPITOR) 20 MG tablet Take 1 tablet (20 mg total) by mouth daily.   buPROPion  (WELLBUTRIN  XL) 300 MG 24 hr tablet TAKE 1 TABLET BY MOUTH EVERY DAY   celecoxib  (CELEBREX ) 100 MG capsule TAKE 1 CAPSULE BY MOUTH 2 TIMES DAILY AS NEEDED FOR MODERATE PAIN (PAIN SCORE 4-6).   cyclobenzaprine  (FLEXERIL ) 5 MG tablet Take 1 tablet (5 mg total) by mouth at bedtime as needed for muscle spasms.   empagliflozin  (JARDIANCE ) 10 MG TABS tablet Take 1 tablet (10 mg total) by mouth daily before breakfast.   fluticasone  (FLONASE ) 50 MCG/ACT nasal spray Place 2 sprays into both nostrils daily.   glucose blood (ONETOUCH VERIO) test strip USE AS DIRECTED   hydrOXYzine  (VISTARIL ) 50 MG capsule Take 1 capsule (50 mg total) by mouth at bedtime as needed.    ipratropium (ATROVENT ) 0.06 % nasal spray Place 2-4 sprays into each nostril 4 (four) times daily.   lisinopril  (ZESTRIL ) 5 MG tablet Take 1 tablet (5 mg total) by mouth daily.   metFORMIN  (GLUCOPHAGE ) 1000 MG tablet Take 1 tablet (1,000 mg total) by mouth 2 (two) times daily with food..   ondansetron  (ZOFRAN ) 4 MG tablet Take 1 tablet (4 mg total) by mouth every 8 (eight) hours as needed for nausea or vomiting.   OneTouch Delica Lancets 33G MISC USE AS DIRECTED 3 TIMES A DAY   oxybutynin  (DITROPAN ) 5 MG tablet Take 1 tablet (5 mg total) by mouth 3 (three) times daily.   promethazine  (PHENERGAN ) 12.5 MG tablet Take 1 tablet (12.5 mg total) by mouth every 8 (eight) hours as needed FOR NAUSEA AND VOMITING.   tirzepatide  (MOUNJARO ) 12.5 MG/0.5ML Pen Inject 12.5 mg into the skin once a week.   [DISCONTINUED] pseudoephedrine  (SUDAFED) 120 MG 12 hr tablet Take 1 tablet (120 mg total) by mouth every 12 (twelve) hours for 7 days as needed   No facility-administered medications prior to visit.    Review of Systems  Constitutional:  Positive for fatigue. Negative for fever.  Respiratory:  Negative for cough and shortness of breath.   Cardiovascular:  Negative for chest pain and leg swelling.  Gastrointestinal:  Negative for abdominal pain.  Neurological:  Negative for dizziness and headaches.       Objective  BP 121/82   Pulse 65   Ht 5\' 7"  (1.702 m)   Wt (!) 303 lb 6.4 oz (137.6 kg)   LMP 11/30/1998 (Approximate) Comment: Hysterectomy at age 82 yo  BMI 47.52 kg/m    Physical Exam Vitals reviewed.  Constitutional:      Appearance: She is not ill-appearing.  HENT:     Head: Normocephalic.  Eyes:     Conjunctiva/sclera: Conjunctivae normal.  Cardiovascular:     Rate and Rhythm: Normal rate.  Pulmonary:     Effort: Pulmonary effort is normal. No respiratory distress.  Chest:     Comments: Left breast 10-11:00 with a slightly mobile, well circumscribed, firm ~1cm mass. Non  tender.  No other masses felt either breast. No L axillary lymphadenopathy Neurological:     Mental Status: She is alert and oriented to person, place, and time.  Psychiatric:        Mood and Affect: Mood normal.        Behavior: Behavior normal.     No results found for any visits on 07/11/23.  Assessment & Plan    Mass of upper inner quadrant of left breast -     MM 3D DIAGNOSTIC MAMMOGRAM BILATERAL BREAST -     US  LIMITED ULTRASOUND INCLUDING AXILLA LEFT BREAST   Other fatigue -     TSH -     CBC with Differential/Platelet -     Vitamin B12 -     VITAMIN D  25 Hydroxy (Vit-D Deficiency, Fractures)  Well controlled type 2 diabetes mellitus without evidence of diabetic end organ manifestation (HCC) -     Comprehensive metabolic panel with GFR -     Hemoglobin A1c    Return if symptoms worsen or fail to improve.       Trenton Frock, PA-C  [facility] at [facility] [phone] (phone) [phone] (fax)  [facility]

## 2023-07-16 ENCOUNTER — Ambulatory Visit
Admission: RE | Admit: 2023-07-16 | Discharge: 2023-07-16 | Disposition: A | Discharge status: Home / Self Care | Source: Ambulatory Visit | Attending: Physician Assistant

## 2023-07-16 ENCOUNTER — Other Ambulatory Visit: Payer: Self-pay

## 2023-07-16 ENCOUNTER — Ambulatory Visit
Admission: RE | Admit: 2023-07-16 | Discharge: 2023-07-16 | Disposition: A | Discharge status: Home / Self Care | Source: Ambulatory Visit | Attending: Physician Assistant | Admitting: Physician Assistant

## 2023-07-16 DIAGNOSIS — N63 Unspecified lump in unspecified breast: Secondary | ICD-10-CM

## 2023-07-16 DIAGNOSIS — R928 Other abnormal and inconclusive findings on diagnostic imaging of breast: Secondary | ICD-10-CM | POA: Insufficient documentation

## 2023-07-16 DIAGNOSIS — N6322 Unspecified lump in the left breast, upper inner quadrant: Secondary | ICD-10-CM | POA: Insufficient documentation

## 2023-07-16 MED ORDER — MOUNJARO 12.5 MG/0.5ML ~~LOC~~ SOAJ
12.5000 mg | SUBCUTANEOUS | 1 refills | Status: DC
  Filled 2023-07-16: qty 6, 84d supply, fill #0

## 2023-07-16 MED ORDER — HYDROXYZINE PAMOATE 50 MG PO CAPS
50.0000 mg | ORAL_CAPSULE | Freq: Every evening | ORAL | 0 refills | Status: DC | PRN
  Filled 2023-11-01: qty 90, 90d supply, fill #0

## 2023-07-28 ENCOUNTER — Other Ambulatory Visit: Payer: Self-pay

## 2023-08-04 ENCOUNTER — Other Ambulatory Visit: Payer: Self-pay

## 2023-08-06 ENCOUNTER — Other Ambulatory Visit: Payer: Self-pay | Admitting: Physician Assistant

## 2023-08-06 DIAGNOSIS — G8929 Other chronic pain: Secondary | ICD-10-CM

## 2023-08-11 ENCOUNTER — Other Ambulatory Visit: Payer: Self-pay | Admitting: Physician Assistant

## 2023-08-11 ENCOUNTER — Other Ambulatory Visit: Payer: Self-pay

## 2023-08-11 ENCOUNTER — Encounter: Payer: Self-pay | Admitting: Physician Assistant

## 2023-08-11 DIAGNOSIS — G8929 Other chronic pain: Secondary | ICD-10-CM

## 2023-08-11 DIAGNOSIS — R110 Nausea: Secondary | ICD-10-CM

## 2023-08-11 MED ORDER — PROMETHAZINE HCL 12.5 MG PO TABS
12.5000 mg | ORAL_TABLET | Freq: Three times a day (TID) | ORAL | 2 refills | Status: AC | PRN
  Filled 2023-08-11: qty 30, 10d supply, fill #0
  Filled 2023-09-24 (×2): qty 30, 10d supply, fill #1
  Filled 2023-12-14: qty 30, 10d supply, fill #2

## 2023-08-11 MED ORDER — CELECOXIB 100 MG PO CAPS
100.0000 mg | ORAL_CAPSULE | Freq: Two times a day (BID) | ORAL | 1 refills | Status: DC | PRN
  Filled 2023-08-11 – 2023-10-09 (×3): qty 60, 30d supply, fill #0

## 2023-08-11 MED FILL — Bupropion HCl Tab ER 24HR 300 MG: ORAL | 90 days supply | Qty: 90 | Fill #0 | Status: AC

## 2023-08-30 ENCOUNTER — Other Ambulatory Visit: Payer: Self-pay | Admitting: Physician Assistant

## 2023-08-30 DIAGNOSIS — E119 Type 2 diabetes mellitus without complications: Secondary | ICD-10-CM

## 2023-08-31 ENCOUNTER — Other Ambulatory Visit: Payer: Self-pay

## 2023-09-08 ENCOUNTER — Other Ambulatory Visit: Payer: Self-pay

## 2023-09-08 ENCOUNTER — Telehealth: Admitting: Family Medicine

## 2023-09-08 DIAGNOSIS — H00014 Hordeolum externum left upper eyelid: Secondary | ICD-10-CM

## 2023-09-08 MED ORDER — POLYMYXIN B-TRIMETHOPRIM 10000-0.1 UNIT/ML-% OP SOLN
1.0000 [drp] | OPHTHALMIC | 0 refills | Status: AC
Start: 2023-09-08 — End: 2023-10-12
  Filled 2023-09-08: qty 10, 34d supply, fill #0

## 2023-09-08 NOTE — Patient Instructions (Signed)
 Stye A stye, also known as a hordeolum, is a bump that forms on an eyelid. It may look like a pimple next to the eyelash. A stye can form inside the eyelid (internal stye) or outside the eyelid (external stye). A stye can cause redness, swelling, and pain on the eyelid. Styes are very common. Anyone can get them at any age. They usually occur in just one eye at a time, but you may have more than one in either eye. What are the causes? A stye is caused by an infection. The infection is almost always caused by bacteria called Staphylococcus aureus. This is a common type of bacteria that lives on the skin. An internal stye may result from an infected oil-producing gland inside the eyelid. An external stye may be caused by an infection at the base of the eyelash (hair follicle). What increases the risk? You are more likely to develop a stye if: You have had a stye before. You have any of these conditions: Red, itchy, inflamed eyelids (blepharitis). A skin condition such as seborrheic dermatitis or rosacea. High fat levels in your blood (lipids). Dry eyes. What are the signs or symptoms? The most common symptom of a stye is eyelid pain. Internal styes are more painful than external styes. Other symptoms may include: Painful swelling of your eyelid. A scratchy feeling in your eye. Tearing and redness of your eye. A pimple-like bump on the edge of the eyelid. Pus draining from the stye. How is this diagnosed? Your health care provider may be able to diagnose a stye just by examining your eye. The health care provider may also check to make sure: You do not have a fever or other signs of a more serious infection. The infection has not spread to other parts of your eye or areas around your eye. How is this treated? Most styes will clear up in a few days without treatment or with warm compresses applied to the area. You may need to use antibiotic drops or ointment to treat an infection. Sometimes,  steroid drops or ointment are used in addition to antibiotics. In some cases, your health care provider may give you a small steroid injection in the eyelid. If your stye does not heal with routine treatment, your health care provider may drain pus from the stye using a thin blade or needle. This may be done if the stye is large, causing a lot of pain, or affecting your vision. Follow these instructions at home: Take over-the-counter and prescription medicines only as told by your health care provider. This includes eye drops or ointments. If you were prescribed an antibiotic medicine, steroid medicine, or both, apply or use them as told by your health care provider. Do not stop using the medicine even if your condition improves. Apply a warm, wet cloth (warm compress) to your eye for 5-10 minutes, 4 to 6 times a day. Clean the affected eyelid as directed by your health care provider. Do not wear contact lenses or eye makeup until your stye has healed and your health care provider says that it is safe. Do not try to pop or drain the stye. Do not rub your eye. Contact a health care provider if: You have chills or a fever. Your stye does not go away after several days. Your stye affects your vision. Your eyeball becomes swollen, red, or painful. Get help right away if: You have pain when moving your eye around. Summary A stye is a bump that forms  on an eyelid. It may look like a pimple next to the eyelash. A stye can form inside the eyelid (internal stye) or outside the eyelid (external stye). A stye can cause redness, swelling, and pain on the eyelid. Your health care provider may be able to diagnose a stye just by examining your eye. Apply a warm, wet cloth (warm compress) to your eye for 5-10 minutes, 4 to 6 times a day. This information is not intended to replace advice given to you by your health care provider. Make sure you discuss any questions you have with your health care  provider. Document Revised: 04/05/2020 Document Reviewed: 04/05/2020 Elsevier Patient Education  2024 [employer].

## 2023-09-08 NOTE — Progress Notes (Signed)
 Virtual Visit Consent   Leslie Morris, you are scheduled for a virtual visit with a [facility] provider today. Just as with appointments in the office, your consent must be obtained to participate. Your consent will be active for this visit and any virtual visit you may have with one of our providers in the next 365 days. If you have a MyChart account, a copy of this consent can be sent to you electronically.  As this is a virtual visit, video technology does not allow for your provider to perform a traditional examination. This may limit your provider's ability to fully assess your condition. If your provider identifies any concerns that need to be evaluated in person or the need to arrange testing (such as labs, EKG, etc.), we will make arrangements to do so. Although advances in technology are sophisticated, we cannot ensure that it will always work on either your end or our end. If the connection with a video visit is poor, the visit may have to be switched to a telephone visit. With either a video or telephone visit, we are not always able to ensure that we have a secure connection.  By engaging in this virtual visit, you consent to the provision of healthcare and authorize for your insurance to be billed (if applicable) for the services provided during this visit. Depending on your insurance coverage, you may receive a charge related to this service.  I need to obtain your verbal consent now. Are you willing to proceed with your visit today? ZAHRIYAH JOO has provided verbal consent on 09/08/2023 for a virtual visit (video or telephone). Loa Lamp, FNP  Date: 09/08/2023 9:03 AM   Virtual Visit via Video Note   I, Loa Lamp, connected with  Leslie Morris  ([id number], 08-Aug-1970) on 09/08/23 at  9:00 AM EDT by a video-enabled telemedicine application and verified that I am speaking with the correct person using two identifiers.  Location:atient: Virtual Visit Location Patient: Home Provider:  Virtual Visit Location Provider: Home Office   I discussed the limitations of evaluation and management by telemedicine and the availability of in person appointments. The patient expressed understanding and agreed to proceed.    History of Present Illness: Leslie Morris is a 53 y.o. who identifies as a female who was assigned female at birth, and is being seen today for an early stye left eye upper lid with soreness, with one 2 weeks, it went away on its own. Staff member had the same problems.   HPI: HPI  Problems:  Patient Active Problem List   Diagnosis Date Noted   Multiple joint pain 10/10/2022   Other insomnia 01/11/2022   Recurrent depression (HCC) 12/04/2021   Nausea 09/07/2021   Encounter for smoking cessation counseling 03/09/2021   Elevated blood pressure reading 03/09/2021   Chronic pain of left ankle 11/30/2020   Antibiotic-induced yeast infection 11/30/2020   Microalbuminuria 11/30/2020   Well controlled type 2 diabetes mellitus without evidence of diabetic end organ manifestation (HCC) 10/03/2020   Atrophic vaginitis 10/03/2020   Perimenopausal 10/03/2020   Hyperlipidemia associated with type 2 diabetes mellitus (HCC) 04/05/2019   Adjustment disorder with mixed anxiety and depressed mood 12/17/2018   Morbid obesity (HCC) 01/04/2018   Stress incontinence 12/04/2017   History of recurrent UTI (urinary tract infection) 12/04/2017   Carpal tunnel syndrome of left wrist 03/07/2017   Right knee pain 06/30/2015    Allergies:  Allergies  Allergen Reactions   Levaquin  [Levofloxacin  In D5w]  Anaphylaxis and Rash   Macrobid  [Nitrofurantoin ] Rash   Medications:  Current Outpatient Medications:    aspirin EC 81 MG tablet, Take 81 mg by mouth daily., Disp: , Rfl:    atorvastatin  (LIPITOR) 20 MG tablet, Take 1 tablet (20 mg total) by mouth daily., Disp: 90 tablet, Rfl: 3   buPROPion  (WELLBUTRIN  XL) 300 MG 24 hr tablet, Take 1 tablet (300 mg total) by mouth daily., Disp: 90  tablet, Rfl: 3   celecoxib  (CELEBREX ) 100 MG capsule, Take 1 capsule (100 mg total) by mouth 2 (two) times daily as needed for moderate pain (pain score 4-6)., Disp: 60 capsule, Rfl: 1   cyanocobalamin  (VITAMIN B12) 1000 MCG tablet, Take 1 tablet (1,000 mcg total) by mouth daily., Disp: 90 tablet, Rfl: 2   cyclobenzaprine  (FLEXERIL ) 5 MG tablet, Take 1 tablet (5 mg total) by mouth at bedtime as needed for muscle spasms., Disp: 30 tablet, Rfl: 2   empagliflozin  (JARDIANCE ) 10 MG TABS tablet, Take 1 tablet (10 mg total) by mouth daily before breakfast., Disp: 90 tablet, Rfl: 2   fluticasone  (FLONASE ) 50 MCG/ACT nasal spray, Place 2 sprays into both nostrils daily., Disp: 16 g, Rfl: 0   glucose blood (ONETOUCH VERIO) test strip, USE AS DIRECTED, Disp: 100 strip, Rfl: 3   hydrOXYzine  (VISTARIL ) 50 MG capsule, Take 1 capsule (50 mg total) by mouth at bedtime as needed., Disp: 90 capsule, Rfl: 1   hydrOXYzine  (VISTARIL ) 50 MG capsule, Take 1 capsule (50 mg total) by mouth at bedtime as needed., Disp: 180 capsule, Rfl: 0   ipratropium (ATROVENT ) 0.06 % nasal spray, Place 2-4 sprays into each nostril 4 (four) times daily., Disp: 15 mL, Rfl: 0   lisinopril  (ZESTRIL ) 5 MG tablet, Take 1 tablet (5 mg total) by mouth daily., Disp: 90 tablet, Rfl: 3   metFORMIN  (GLUCOPHAGE ) 1000 MG tablet, Take 1 tablet (1,000 mg total) by mouth 2 (two) times daily with food.., Disp: 180 tablet, Rfl: 3   ondansetron  (ZOFRAN ) 4 MG tablet, Take 1 tablet (4 mg total) by mouth every 8 (eight) hours as needed for nausea or vomiting., Disp: 60 tablet, Rfl: 1   OneTouch Delica Lancets 33G MISC, USE AS DIRECTED 3 TIMES A DAY, Disp: 100 each, Rfl: 1   oxybutynin  (DITROPAN ) 5 MG tablet, Take 1 tablet (5 mg total) by mouth 3 (three) times daily., Disp: 270 tablet, Rfl: 1   promethazine  (PHENERGAN ) 12.5 MG tablet, Take 1 tablet (12.5 mg total) by mouth every 8 (eight) hours as needed FOR NAUSEA AND VOMITING., Disp: 30 tablet, Rfl: 2    tirzepatide  (MOUNJARO ) 12.5 MG/0.5ML Pen, Inject 12.5 mg into the skin once a week., Disp: 6 mL, Rfl: 1   tirzepatide  (MOUNJARO ) 12.5 MG/0.5ML Pen, Inject 12.5 mg into the skin once a week., Disp: 6 mL, Rfl: 1   Vitamin D , Ergocalciferol , (DRISDOL ) 1.25 MG (50000 UNIT) CAPS capsule, Take 1 capsule (50,000 Units total) by mouth every 7 (seven) days., Disp: 12 capsule, Rfl: 1  Observations/Objective: Patient is well-developed, well-nourished in no acute distress.  Resting comfortably  at home.  Head is normocephalic, atraumatic.  No labored breathing.  Speech is clear and coherent with logical content.  Patient is alert and oriented at baseline.    Assessment and Plan: 1. Hordeolum externum of left upper eyelid (Primary)  Warm compresses UC as needed.   Follow Up Instructions: I discussed the assessment and treatment plan with the patient. The patient was provided an opportunity to ask questions and  all were answered. The patient agreed with the plan and demonstrated an understanding of the instructions.  A copy of instructions were sent to the patient via MyChart unless otherwise noted below.     The patient was advised to call back or seek an in-person evaluation if the symptoms worsen or if the condition fails to improve as anticipated.    Kenlyn Lose, FNP

## 2023-09-23 ENCOUNTER — Encounter: Payer: Self-pay | Admitting: Physician Assistant

## 2023-09-24 ENCOUNTER — Other Ambulatory Visit: Payer: Self-pay | Admitting: Physician Assistant

## 2023-09-24 DIAGNOSIS — E119 Type 2 diabetes mellitus without complications: Secondary | ICD-10-CM

## 2023-10-06 ENCOUNTER — Other Ambulatory Visit: Payer: Self-pay

## 2023-10-06 ENCOUNTER — Other Ambulatory Visit: Payer: Self-pay | Admitting: Physician Assistant

## 2023-10-06 DIAGNOSIS — E119 Type 2 diabetes mellitus without complications: Secondary | ICD-10-CM

## 2023-10-06 MED ORDER — TIRZEPATIDE 15 MG/0.5ML ~~LOC~~ SOAJ
15.0000 mg | SUBCUTANEOUS | 2 refills | Status: AC
  Filled 2023-10-06: qty 2, 28d supply, fill #0
  Filled 2023-11-14: qty 2, 28d supply, fill #1
  Filled 2023-11-15: qty 6, 84d supply, fill #1
  Filled 2024-02-11: qty 6, 84d supply, fill #2

## 2023-10-09 ENCOUNTER — Other Ambulatory Visit: Payer: Self-pay | Admitting: Physician Assistant

## 2023-10-09 ENCOUNTER — Other Ambulatory Visit: Payer: Self-pay

## 2023-10-09 DIAGNOSIS — N393 Stress incontinence (female) (male): Secondary | ICD-10-CM

## 2023-10-09 MED ORDER — OXYBUTYNIN CHLORIDE 5 MG PO TABS
5.0000 mg | ORAL_TABLET | Freq: Three times a day (TID) | ORAL | 1 refills | Status: AC
  Filled 2023-10-09: qty 270, 90d supply, fill #0
  Filled 2023-12-02 – 2024-01-23 (×2): qty 270, 90d supply, fill #1

## 2023-10-10 ENCOUNTER — Other Ambulatory Visit: Payer: Self-pay

## 2023-10-10 ENCOUNTER — Other Ambulatory Visit: Payer: Self-pay | Admitting: Physician Assistant

## 2023-10-10 DIAGNOSIS — G8929 Other chronic pain: Secondary | ICD-10-CM

## 2023-10-16 ENCOUNTER — Other Ambulatory Visit: Payer: Self-pay | Admitting: *Deleted

## 2023-10-16 ENCOUNTER — Other Ambulatory Visit: Payer: Self-pay

## 2023-10-16 DIAGNOSIS — G8929 Other chronic pain: Secondary | ICD-10-CM

## 2023-10-16 MED ORDER — CELECOXIB 100 MG PO CAPS
100.0000 mg | ORAL_CAPSULE | Freq: Two times a day (BID) | ORAL | 1 refills | Status: DC
Start: 2023-10-16 — End: 2023-12-31
  Filled 2023-10-16: qty 60, 30d supply, fill #0
  Filled 2023-11-14 – 2023-12-02 (×4): qty 60, 30d supply, fill #1

## 2023-10-23 MED FILL — Bupropion HCl Tab ER 24HR 300 MG: ORAL | 90 days supply | Qty: 90 | Fill #1 | Status: AC

## 2023-10-24 ENCOUNTER — Other Ambulatory Visit: Payer: Self-pay

## 2023-10-31 ENCOUNTER — Other Ambulatory Visit: Payer: Self-pay

## 2023-11-02 ENCOUNTER — Other Ambulatory Visit: Payer: Self-pay

## 2023-11-03 ENCOUNTER — Other Ambulatory Visit: Payer: Self-pay

## 2023-11-14 ENCOUNTER — Other Ambulatory Visit: Payer: Self-pay

## 2023-11-15 ENCOUNTER — Other Ambulatory Visit: Payer: Self-pay

## 2023-12-02 ENCOUNTER — Other Ambulatory Visit: Payer: Self-pay

## 2023-12-03 ENCOUNTER — Other Ambulatory Visit: Payer: Self-pay

## 2023-12-04 ENCOUNTER — Other Ambulatory Visit: Payer: Self-pay

## 2023-12-05 ENCOUNTER — Other Ambulatory Visit: Payer: Self-pay

## 2023-12-08 ENCOUNTER — Other Ambulatory Visit: Payer: Self-pay

## 2023-12-09 ENCOUNTER — Other Ambulatory Visit: Payer: Self-pay

## 2023-12-11 ENCOUNTER — Other Ambulatory Visit: Payer: Self-pay

## 2023-12-11 MED ORDER — ONDANSETRON HCL 4 MG PO TABS
4.0000 mg | ORAL_TABLET | Freq: Three times a day (TID) | ORAL | 0 refills | Status: DC | PRN
  Filled 2023-12-11: qty 30, 5d supply, fill #0

## 2023-12-11 MED ORDER — CYCLOBENZAPRINE HCL 5 MG PO TABS
5.0000 mg | ORAL_TABLET | Freq: Every day | ORAL | 0 refills | Status: AC | PRN
  Filled 2023-12-11: qty 90, 90d supply, fill #0

## 2023-12-11 MED ORDER — LISINOPRIL 5 MG PO TABS
5.0000 mg | ORAL_TABLET | Freq: Every day | ORAL | 0 refills | Status: DC
  Filled 2023-12-11: qty 90, 90d supply, fill #0

## 2023-12-12 ENCOUNTER — Other Ambulatory Visit: Payer: Self-pay

## 2023-12-24 ENCOUNTER — Other Ambulatory Visit: Payer: Self-pay

## 2023-12-31 ENCOUNTER — Other Ambulatory Visit: Payer: Self-pay | Admitting: Internal Medicine

## 2023-12-31 DIAGNOSIS — G8929 Other chronic pain: Secondary | ICD-10-CM

## 2024-01-26 ENCOUNTER — Other Ambulatory Visit: Payer: Self-pay

## 2024-01-28 ENCOUNTER — Other Ambulatory Visit: Payer: Self-pay

## 2024-01-29 ENCOUNTER — Other Ambulatory Visit: Payer: Self-pay

## 2024-01-29 ENCOUNTER — Other Ambulatory Visit: Payer: Self-pay | Admitting: Internal Medicine

## 2024-01-29 DIAGNOSIS — E1169 Type 2 diabetes mellitus with other specified complication: Secondary | ICD-10-CM

## 2024-01-29 DIAGNOSIS — E1165 Type 2 diabetes mellitus with hyperglycemia: Secondary | ICD-10-CM

## 2024-01-30 ENCOUNTER — Other Ambulatory Visit: Payer: Self-pay

## 2024-02-01 ENCOUNTER — Other Ambulatory Visit: Payer: Self-pay

## 2024-02-01 MED FILL — Metformin HCl Tab 1000 MG: ORAL | 90 days supply | Qty: 180 | Fill #0 | Status: CN

## 2024-02-01 MED FILL — Metformin HCl Tab 1000 MG: ORAL | 3 days supply | Qty: 7 | Fill #0 | Status: AC

## 2024-02-01 MED FILL — Atorvastatin Calcium Tab 20 MG (Base Equivalent): ORAL | 10 days supply | Qty: 10 | Fill #0 | Status: CN

## 2024-02-01 MED FILL — Atorvastatin Calcium Tab 20 MG (Base Equivalent): ORAL | 90 days supply | Qty: 90 | Fill #0 | Status: AC

## 2024-02-01 MED FILL — Metformin HCl Tab 1000 MG: ORAL | 87 days supply | Qty: 173 | Fill #0 | Status: AC

## 2024-02-01 MED FILL — Atorvastatin Calcium Tab 20 MG (Base Equivalent): ORAL | 80 days supply | Qty: 80 | Fill #0 | Status: CN

## 2024-02-02 ENCOUNTER — Ambulatory Visit: Payer: Self-pay

## 2024-02-03 ENCOUNTER — Other Ambulatory Visit: Payer: Self-pay

## 2024-02-08 ENCOUNTER — Other Ambulatory Visit: Payer: Self-pay

## 2024-02-09 ENCOUNTER — Ambulatory Visit: Payer: Self-pay

## 2024-02-17 ENCOUNTER — Other Ambulatory Visit: Payer: Self-pay

## 2024-02-23 ENCOUNTER — Encounter: Payer: Self-pay | Admitting: Family

## 2024-02-23 ENCOUNTER — Ambulatory Visit
Admission: RE | Admit: 2024-02-23 | Discharge: 2024-02-23 | Disposition: A | Source: Ambulatory Visit | Attending: Family

## 2024-02-23 ENCOUNTER — Ambulatory Visit: Admitting: Family

## 2024-02-23 ENCOUNTER — Other Ambulatory Visit: Payer: Self-pay

## 2024-02-23 VITALS — BP 126/86 | HR 75 | Temp 98.2°F | Ht 67.0 in | Wt 290.2 lb

## 2024-02-23 DIAGNOSIS — E538 Deficiency of other specified B group vitamins: Secondary | ICD-10-CM | POA: Diagnosis not present

## 2024-02-23 DIAGNOSIS — M2550 Pain in unspecified joint: Secondary | ICD-10-CM

## 2024-02-23 DIAGNOSIS — Z7985 Long-term (current) use of injectable non-insulin antidiabetic drugs: Secondary | ICD-10-CM | POA: Diagnosis not present

## 2024-02-23 DIAGNOSIS — E559 Vitamin D deficiency, unspecified: Secondary | ICD-10-CM

## 2024-02-23 DIAGNOSIS — N393 Stress incontinence (female) (male): Secondary | ICD-10-CM

## 2024-02-23 DIAGNOSIS — E785 Hyperlipidemia, unspecified: Secondary | ICD-10-CM | POA: Diagnosis not present

## 2024-02-23 DIAGNOSIS — Z87828 Personal history of other (healed) physical injury and trauma: Secondary | ICD-10-CM | POA: Insufficient documentation

## 2024-02-23 DIAGNOSIS — E1169 Type 2 diabetes mellitus with other specified complication: Secondary | ICD-10-CM

## 2024-02-23 DIAGNOSIS — E119 Type 2 diabetes mellitus without complications: Secondary | ICD-10-CM

## 2024-02-23 DIAGNOSIS — Z79899 Other long term (current) drug therapy: Secondary | ICD-10-CM

## 2024-02-23 DIAGNOSIS — F4323 Adjustment disorder with mixed anxiety and depressed mood: Secondary | ICD-10-CM | POA: Diagnosis not present

## 2024-02-23 DIAGNOSIS — M25532 Pain in left wrist: Secondary | ICD-10-CM

## 2024-02-23 DIAGNOSIS — Z87891 Personal history of nicotine dependence: Secondary | ICD-10-CM | POA: Insufficient documentation

## 2024-02-23 DIAGNOSIS — F5104 Psychophysiologic insomnia: Secondary | ICD-10-CM | POA: Diagnosis not present

## 2024-02-23 DIAGNOSIS — R110 Nausea: Secondary | ICD-10-CM | POA: Diagnosis not present

## 2024-02-23 LAB — SEDIMENTATION RATE: Sed Rate: 31 mm/h — ABNORMAL HIGH (ref 0–30)

## 2024-02-23 LAB — COMPREHENSIVE METABOLIC PANEL WITH GFR
ALT: 25 U/L (ref 3–35)
AST: 15 U/L (ref 5–37)
Albumin: 4.2 g/dL (ref 3.5–5.2)
Alkaline Phosphatase: 59 U/L (ref 39–117)
BUN: 19 mg/dL (ref 6–23)
CO2: 27 meq/L (ref 19–32)
Calcium: 9 mg/dL (ref 8.4–10.5)
Chloride: 101 meq/L (ref 96–112)
Creatinine, Ser: 0.87 mg/dL (ref 0.40–1.20)
GFR: 77.33 mL/min
Glucose, Bld: 99 mg/dL (ref 70–99)
Potassium: 4.3 meq/L (ref 3.5–5.1)
Sodium: 139 meq/L (ref 135–145)
Total Bilirubin: 0.6 mg/dL (ref 0.2–1.2)
Total Protein: 7.1 g/dL (ref 6.0–8.3)

## 2024-02-23 LAB — TSH: TSH: 2.86 u[IU]/mL (ref 0.35–5.50)

## 2024-02-23 LAB — C-REACTIVE PROTEIN: CRP: 0.6 mg/dL — ABNORMAL LOW (ref 1.0–20.0)

## 2024-02-23 LAB — HEMOGLOBIN A1C: Hgb A1c MFr Bld: 6.5 % (ref 4.6–6.5)

## 2024-02-23 LAB — MICROALBUMIN / CREATININE URINE RATIO
Creatinine,U: 180.6 mg/dL
Microalb Creat Ratio: 27.6 mg/g (ref 0.0–30.0)
Microalb, Ur: 5 mg/dL — ABNORMAL HIGH (ref 0.7–1.9)

## 2024-02-23 LAB — VITAMIN D 25 HYDROXY (VIT D DEFICIENCY, FRACTURES): VITD: 27.44 ng/mL — ABNORMAL LOW (ref 30.00–100.00)

## 2024-02-23 LAB — VITAMIN B12: Vitamin B-12: 1179 pg/mL — ABNORMAL HIGH (ref 211–911)

## 2024-02-23 MED ORDER — BUPROPION HCL ER (XL) 150 MG PO TB24
150.0000 mg | ORAL_TABLET | Freq: Every day | ORAL | 1 refills | Status: DC
  Filled 2024-02-23: qty 30, 30d supply, fill #0

## 2024-02-23 MED ORDER — CONTRAVE 8-90 MG PO TB12
ORAL_TABLET | ORAL | 0 refills | Status: DC
  Filled 2024-02-23: qty 360, 90d supply, fill #0
  Filled 2024-02-24: qty 360, 127d supply, fill #0
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
## 2024-02-23 NOTE — Patient Instructions (Signed)
" °  Stop 300 mg wellbutrin  once you start contrave   Start 150 mg wellbutrin  in its place  When you get to 2 tablets twice daily then you need to stop the wellbutrin  all together  "

## 2024-02-24 ENCOUNTER — Other Ambulatory Visit: Payer: Self-pay

## 2024-02-24 ENCOUNTER — Encounter: Payer: Self-pay | Admitting: Family

## 2024-02-24 LAB — RHEUMATOID FACTOR: Rheumatoid fact SerPl-aCnc: <10 [IU]/mL

## 2024-02-24 LAB — ANA W/REFLEX: ANA Titer 1: NEGATIVE

## 2024-02-26 ENCOUNTER — Other Ambulatory Visit: Payer: Self-pay

## 2024-02-26 ENCOUNTER — Ambulatory Visit: Payer: Self-pay | Admitting: Family

## 2024-02-26 DIAGNOSIS — E559 Vitamin D deficiency, unspecified: Secondary | ICD-10-CM

## 2024-02-26 DIAGNOSIS — R809 Proteinuria, unspecified: Secondary | ICD-10-CM

## 2024-02-26 DIAGNOSIS — E119 Type 2 diabetes mellitus without complications: Secondary | ICD-10-CM

## 2024-02-26 DIAGNOSIS — G8929 Other chronic pain: Secondary | ICD-10-CM

## 2024-02-26 MED ORDER — EMPAGLIFLOZIN 25 MG PO TABS
25.0000 mg | ORAL_TABLET | Freq: Every day | ORAL | 3 refills | Status: AC
  Filled 2024-02-26: qty 90, 90d supply, fill #0

## 2024-02-26 MED ORDER — CHOLECALCIFEROL 1.25 MG (50000 UT) PO CAPS
50000.0000 [IU] | ORAL_CAPSULE | ORAL | 0 refills | Status: AC
  Filled 2024-02-26: qty 12, 84d supply, fill #0

## 2024-02-29 ENCOUNTER — Other Ambulatory Visit: Payer: Self-pay | Admitting: Family

## 2024-02-29 DIAGNOSIS — G8929 Other chronic pain: Secondary | ICD-10-CM

## 2024-03-06 ENCOUNTER — Other Ambulatory Visit: Payer: Self-pay

## 2024-03-08 ENCOUNTER — Encounter: Payer: Self-pay | Admitting: Family

## 2024-03-08 ENCOUNTER — Other Ambulatory Visit: Payer: Self-pay

## 2024-03-08 MED ORDER — LISINOPRIL 5 MG PO TABS
5.0000 mg | ORAL_TABLET | Freq: Every day | ORAL | 3 refills | Status: AC
  Filled 2024-03-08: qty 90, 90d supply, fill #0

## 2024-03-09 ENCOUNTER — Other Ambulatory Visit: Payer: Self-pay

## 2024-03-09 MED ORDER — LISINOPRIL 5 MG PO TABS
5.0000 mg | ORAL_TABLET | Freq: Every day | ORAL | 0 refills | Status: AC
  Filled 2024-03-09: qty 90, 90d supply, fill #0

## 2024-03-11 ENCOUNTER — Other Ambulatory Visit: Payer: Self-pay

## 2024-03-13 ENCOUNTER — Encounter: Payer: Self-pay | Admitting: Family

## 2024-03-13 DIAGNOSIS — F4323 Adjustment disorder with mixed anxiety and depressed mood: Secondary | ICD-10-CM

## 2024-03-15 MED ORDER — BUPROPION HCL ER (XL) 300 MG PO TB24
300.0000 mg | ORAL_TABLET | Freq: Every day | ORAL | 1 refills | Status: AC
  Filled 2024-03-15: qty 90, 90d supply, fill #0

## 2024-03-16 ENCOUNTER — Other Ambulatory Visit: Payer: Self-pay

## 2024-03-29 ENCOUNTER — Ambulatory Visit: Admitting: Family

## 2024-04-05 ENCOUNTER — Ambulatory Visit: Admitting: Family
